# Patient Record
Sex: Male | Born: 1935 | Race: White | Hispanic: No | Marital: Married | Smoking: Former smoker
Health system: Southern US, Community
[De-identification: ages and names within clinical notes are randomized; demographics above are authoritative.]

## PROBLEM LIST (undated history)

## (undated) DIAGNOSIS — K5900 Constipation, unspecified: Secondary | ICD-10-CM

## (undated) DIAGNOSIS — Z8719 Personal history of other diseases of the digestive system: Secondary | ICD-10-CM

## (undated) DIAGNOSIS — R51 Headache: Secondary | ICD-10-CM

## (undated) DIAGNOSIS — I82409 Acute embolism and thrombosis of unspecified deep veins of unspecified lower extremity: Secondary | ICD-10-CM

## (undated) DIAGNOSIS — R519 Headache, unspecified: Secondary | ICD-10-CM

## (undated) DIAGNOSIS — T8859XA Other complications of anesthesia, initial encounter: Secondary | ICD-10-CM

## (undated) DIAGNOSIS — J309 Allergic rhinitis, unspecified: Secondary | ICD-10-CM

## (undated) DIAGNOSIS — D649 Anemia, unspecified: Secondary | ICD-10-CM

## (undated) DIAGNOSIS — Z87442 Personal history of urinary calculi: Secondary | ICD-10-CM

## (undated) DIAGNOSIS — N189 Chronic kidney disease, unspecified: Secondary | ICD-10-CM

## (undated) DIAGNOSIS — J45909 Unspecified asthma, uncomplicated: Secondary | ICD-10-CM

## (undated) DIAGNOSIS — J449 Chronic obstructive pulmonary disease, unspecified: Secondary | ICD-10-CM

## (undated) DIAGNOSIS — M549 Dorsalgia, unspecified: Secondary | ICD-10-CM

## (undated) DIAGNOSIS — C801 Malignant (primary) neoplasm, unspecified: Secondary | ICD-10-CM

## (undated) DIAGNOSIS — M1990 Unspecified osteoarthritis, unspecified site: Secondary | ICD-10-CM

## (undated) DIAGNOSIS — I10 Essential (primary) hypertension: Secondary | ICD-10-CM

## (undated) DIAGNOSIS — T4145XA Adverse effect of unspecified anesthetic, initial encounter: Secondary | ICD-10-CM

## (undated) DIAGNOSIS — Z9889 Other specified postprocedural states: Secondary | ICD-10-CM

## (undated) DIAGNOSIS — Z86010 Personal history of colonic polyps: Secondary | ICD-10-CM

## (undated) DIAGNOSIS — N400 Enlarged prostate without lower urinary tract symptoms: Secondary | ICD-10-CM

## (undated) DIAGNOSIS — J439 Emphysema, unspecified: Secondary | ICD-10-CM

## (undated) HISTORY — PX: OTHER SURGICAL HISTORY: SHX169

## (undated) HISTORY — PX: COLONOSCOPY: SHX174

## (undated) HISTORY — PX: BACK SURGERY: SHX140

## (undated) HISTORY — PX: CHOLECYSTECTOMY: SHX55

## (undated) HISTORY — PX: LITHOTRIPSY: SUR834

## (undated) HISTORY — DX: Emphysema, unspecified: J43.9

---

## 1999-12-12 ENCOUNTER — Ambulatory Visit (HOSPITAL_COMMUNITY): Admission: RE | Admit: 1999-12-12 | Discharge: 1999-12-12 | Payer: Self-pay | Admitting: Gastroenterology

## 2000-01-04 ENCOUNTER — Emergency Department (HOSPITAL_COMMUNITY): Admission: EM | Admit: 2000-01-04 | Discharge: 2000-01-05 | Payer: Self-pay | Admitting: Emergency Medicine

## 2000-01-05 ENCOUNTER — Encounter: Payer: Self-pay | Admitting: Emergency Medicine

## 2000-01-13 ENCOUNTER — Inpatient Hospital Stay (HOSPITAL_COMMUNITY): Admission: EM | Admit: 2000-01-13 | Discharge: 2000-01-14 | Payer: Self-pay | Admitting: *Deleted

## 2000-01-13 ENCOUNTER — Encounter: Payer: Self-pay | Admitting: *Deleted

## 2000-01-13 ENCOUNTER — Encounter: Payer: Self-pay | Admitting: Urology

## 2000-01-14 ENCOUNTER — Encounter: Payer: Self-pay | Admitting: Urology

## 2000-01-16 ENCOUNTER — Ambulatory Visit (HOSPITAL_COMMUNITY): Admission: RE | Admit: 2000-01-16 | Discharge: 2000-01-16 | Payer: Self-pay | Admitting: Urology

## 2000-01-16 ENCOUNTER — Encounter: Payer: Self-pay | Admitting: Urology

## 2000-01-17 ENCOUNTER — Ambulatory Visit (HOSPITAL_COMMUNITY): Admission: RE | Admit: 2000-01-17 | Discharge: 2000-01-17 | Payer: Self-pay | Admitting: Urology

## 2000-04-14 ENCOUNTER — Ambulatory Visit (HOSPITAL_COMMUNITY): Admission: RE | Admit: 2000-04-14 | Discharge: 2000-04-14 | Payer: Self-pay | Admitting: *Deleted

## 2000-06-22 ENCOUNTER — Ambulatory Visit (HOSPITAL_COMMUNITY): Admission: RE | Admit: 2000-06-22 | Discharge: 2000-06-22 | Payer: Self-pay | Admitting: Internal Medicine

## 2004-08-10 ENCOUNTER — Inpatient Hospital Stay (HOSPITAL_COMMUNITY): Admission: EM | Admit: 2004-08-10 | Discharge: 2004-08-14 | Payer: Self-pay | Admitting: Emergency Medicine

## 2004-08-11 ENCOUNTER — Encounter (INDEPENDENT_AMBULATORY_CARE_PROVIDER_SITE_OTHER): Payer: Self-pay | Admitting: Specialist

## 2004-08-27 ENCOUNTER — Ambulatory Visit (HOSPITAL_COMMUNITY): Admission: RE | Admit: 2004-08-27 | Discharge: 2004-08-27 | Payer: Self-pay | Admitting: Internal Medicine

## 2005-03-26 ENCOUNTER — Ambulatory Visit (HOSPITAL_COMMUNITY): Admission: RE | Admit: 2005-03-26 | Discharge: 2005-03-26 | Payer: Self-pay | Admitting: Gastroenterology

## 2005-03-26 ENCOUNTER — Encounter (INDEPENDENT_AMBULATORY_CARE_PROVIDER_SITE_OTHER): Payer: Self-pay | Admitting: *Deleted

## 2005-11-11 ENCOUNTER — Encounter: Admission: RE | Admit: 2005-11-11 | Discharge: 2005-11-11 | Payer: Self-pay | Admitting: Internal Medicine

## 2008-06-02 ENCOUNTER — Encounter (INDEPENDENT_AMBULATORY_CARE_PROVIDER_SITE_OTHER): Payer: Self-pay | Admitting: Internal Medicine

## 2008-06-02 ENCOUNTER — Ambulatory Visit (HOSPITAL_COMMUNITY): Admission: RE | Admit: 2008-06-02 | Discharge: 2008-06-02 | Payer: Self-pay | Admitting: Internal Medicine

## 2008-06-02 ENCOUNTER — Ambulatory Visit: Payer: Self-pay | Admitting: Vascular Surgery

## 2010-04-11 ENCOUNTER — Ambulatory Visit (HOSPITAL_COMMUNITY): Admission: RE | Admit: 2010-04-11 | Discharge: 2010-04-11 | Payer: Self-pay | Admitting: Gastroenterology

## 2010-12-27 NOTE — H&P (Signed)
[place]  Patient:    Justin Woods, Justin Woods                     MRN: [id number] Adm. Date:  [id number] Attending:  Monica Becton                         History and Physical  HISTORY:  This 77 year old white male has a past history of urinary tract calculi, but they all passed spontaneously.  A week ago, he had the onset of left flank pain, and he was found to have a left distal ureteral stone.  This caused intermittent pain, but this morning he came to the emergency room with severe right flank pain, nausea and vomiting.  A CT scan and KUB showed a 3 or 4 mm stone in the distal left ureter and a 4 to 5 mm stone in the upper right ureter about at the top of L4.  Because of severe pain symptomatology, he was admitted for further evaluation and treatment and will be scheduled for surgery today.  PAST MEDICAL HISTORY:  He has a past history of hypertension and asthma.  On EKG, he had some sinus bradycardia.  MEDICATIONS:  Include atenolol at I think a 25 mg dosage.  He also uses Flovent p.r.n. and has been on Percocet for stone pain relief.  ALLERGIES:  None.  SOCIAL HISTORY:  Tobacco: None. Alcohol: None.  PAST SURGICAL HISTORY:  Includes back surgery for lumbar disks, right arthroscopic knee surgery.  REVIEW OF SYSTEMS:  Documented in health history form on the chart.  The above problems are noted.  No other acute significant complaints.  FAMILY HISTORY:  Noncontributory.  PHYSICAL EXAMINATION:  GENERAL:  A very ill-appearing white male who appears stated age.  VITAL SIGNS:  Blood pressure 170/79, pulse 60 and regular, respiratory rate 12, and he was afebrile.  LUNGS:  No respiratory stones.  CHEST:  Clear.  CARDIAC:  Heart tones were regular.  ABDOMEN:  Soft and nontender with slight guarding on the right side but no hepatosplenomegaly, no hernias.  GU:  Penis, urethral meatus, scrotum, testicles, and epididymes  without lesion.  RECTAL:  Exam deferred.  EXTREMITIES:  No edema.  IMPRESSION: 1. Bilateral ureteral calculi. 2. Hypertension. 3. Asthma. 4. Sinus bradycardia.  PLAN:  Admit for IV fluids, IV narcotics, and cystoscopic procedure later in the day. DD:  01/13/00 TD:  01/13/00 Job: [id number]

## 2010-12-27 NOTE — Op Note (Signed)
NAME:  Justin Woods, Justin Woods              ACCOUNT NO.:  [id number]   MEDICAL RECORD NO.:  [id number]          PATIENT TYPE:  INP   LOCATION:  5733                         FACILITY:  [facility]   PHYSICIAN:  Gabrielle Dare. Janee Morn, M.D.DATE OF BIRTH:  02/21/1931   DATE OF PROCEDURE:  08/11/2004  DATE OF DISCHARGE:                                 OPERATIVE REPORT   PREOPERATIVE DIAGNOSES:  1.  Acute cholecystitis.  2.  Possible choledocholithiasis.   POSTOPERATIVE DIAGNOSES:  1.  Acute cholecystitis.  2.  Choledocholithiasis.   PROCEDURE:  Laparoscopic cholecystectomy with intraoperative cholangiogram.   SURGEON:  Gabrielle Dare. Janee Morn, M.D.   ASSISTANT:  Sandria Bales. Ezzard Standing, M.D.   ANESTHESIA:  General.   HISTORY OF PRESENT ILLNESS:  The patient is a 77 year old white male who was  admitted by Dr. Vilinda Boehringer last night with acute cholecystitis and possible  choledocholithiasis.  Liver function tests were elevated and he had some  right upper quadrant pain which was relieved with pain medication.  This  morning, the patient's liver function tests did remain somewhat elevated,  bilirubin was down a little bit to 1.9 and his pain was significantly better  and he is brought for cholecystectomy with cholangiogram.   PROCEDURE IN DETAIL:  Informed consent was obtained, the patient was  receiving intravenous antibiotics, he was brought to the operating room and  general anesthesia was administered.  His abdomen was prepped and draped in  a sterile fashion.  A supraumbilical incision was made along the midline and  the subcutaneous tissues were dissected down within the anterior fascia  which was divided sharply.  The peritoneal cavity was entered under direct  vision without difficulty.  A 0 Vicryl purse-string suture was placed around  the fascial opening and the Hasson trocar was inserted into the abdomen and  the abdomen was insufflated with carbon dioxide in the standard fashion.  Under direct  vision, an 11-mm epigastric and two 5-mm lateral ports were  placed.  0.25% Marcaine with epinephrine was used at all port sites for a  local anesthetic.  The dome of the gallbladder was retracted superomedially.  Several omental adhesions were taken down without difficulty, revealing the  infundibulum which was retracted inferolaterally.  Dissection was begun  laterally and progressed medially, easily identifying the cystic duct and  the cystic artery.  We continued the dissection until a large window was  made between the infundibulum and the cystic duct and the liver.  During  this dissection the cystic artery was also nicely identified and dissected  with a large window.  At this time, two clips were placed proximally on the  cystic artery and one was placed distally and then a clip was placed on the  infundibulo cystic duct junction.  A small nick was made in the cystic duct  and a cholangiogram catheter was inserted.  Intraoperative cholangiogram was  then obtained which demonstrated a likely small stone in the distal common  bile duct.  A little bit of contrast did still get through to the duodenum  but there was a persistent filling defect in  the distal common bile duct.  No other abnormalities were noted.  The cholangiogram catheter was removed,  three clips were placed proximally on the cystic duct and it was divided.  The cystic artery was then also divided between the clips placed previously  and the gallbladder was taken off the liver bed with Bovie cautery.  Several  areas in the liver bed were cauterized to get excellent hemostasis while  removing the gallbladder and then the gallbladder was placed in an EndoCatch  bag and taken out of the abdomen via the supraumbilical port site.  The  abdomen was copiously irrigated.  The liver bed was bovied to get excellent  hemostasis.  Once this was accomplished, some further irrigation was done  and the irrigation returned clear.  We  used a total of almost of 2 liters of  irrigation.  Once irrigation was removed, the ports were removed under  direct vision, the pneumoperitoneum was released, the Hasson trocar was  removed from the abdomen, and the supraumbilical fascial defect was closed  by tying the 0 Vicryl purse-string suture.  All four wounds were copiously  irrigated and some additional local anesthetic was injected and the skin of  each was closed with a running 4-0 Vicryl subcuticular stitch.  Sponge,  needle and instrument counts were correct.  Benzoin and Steri-Strips and  sterile dressings were applied.  The patient tolerated the procedure well  without apparent complications and was taken to the recovery room in stable  condition.   I spoke with Dr. Vilinda Boehringer who admitted the patient in regards to the  findings on our cholangiogram and he plans to arrange for likely ERCP on  Monday but we will order the patient's liver function tests to be rechecked  in the morning to see if they are still elevated.       BET/MEDQ  D:  08/11/2004  T:  08/11/2004  Job:  [id number]   cc:   Fayrene Fearing L. Malon Kindle., M.D.  [address]  [place]  [place] [address]  Fax: [phone]

## 2010-12-27 NOTE — Op Note (Signed)
NAME:  Justin Woods, Justin Woods NO.:  [id number]   MEDICAL RECORD NO.:  [id number]          PATIENT TYPE:  AMB   LOCATION:  ENDO                         FACILITY:  [facility]   PHYSICIAN:  Danise Edge, M.D.   DATE OF BIRTH:  [birth year]   DATE OF PROCEDURE:  03/26/2005  DATE OF DISCHARGE:                                 OPERATIVE REPORT   PROCEDURE:  Colonoscopy and polypectomy.   PROCEDURE INDICATION:  [name] is a 77 year old male born Dec 25, 1930.  [name] has undergone colonoscopic exams to remove neoplastic but  noncancerous colon polyps in the past.   ENDOSCOPIST:  Danise Edge, M.D.   PREMEDICATION:  Versed 4 mg, Demerol 50 mg.   PROCEDURE:  After obtaining informed consent, [name] was placed in the  left lateral decubitus position.  I administered intravenous Demerol and  intravenous Versed to achieve conscious sedation for the procedure.  The  patient's blood pressure, oxygen saturation and cardiac rhythm were  monitored throughout the procedure and documented in the medical record.   Anal inspection and digital rectal exam were normal.  The prostate was  nonnodular.  The Olympus adjustable pediatric colonoscope was introduced  into the rectum and advanced to the cecum.  A normal-appearing ileocecal  valve and appendiceal orifice were identified.  Colonic preparation for the  exam today was satisfactory.   Rectum:  A 2-mm sessile polyp was removed from the midrectum with the cold  biopsy forceps.  Retroflexed view of the distal rectum normal.  Sigmoid colon and descending colon:  Left colonic diverticulosis.  At 35 cm  from the anal verge, a 2-mm sessile polyp was removed with cold biopsy  forceps.  Splenic flexure normal.  Transverse colon normal.  Hepatic flexure normal.  Ascending colon normal.  Cecum and ileocecal valve normal.   ASSESSMENT:  1.  From the midrectum, a small polyp was removed with the cold biopsy      forceps and  from the distal sigmoid colon, a small polyp was removed      with the cold biopsy forceps.  Both polyps were submitted in one bottle      for      pathologic evaluation.  2.  Left colonic diverticulosis.   RECOMMENDATIONS:  Repeat colonoscopy in five years.           ______________________________  Danise Edge, M.D.     MJ/MEDQ  D:  03/26/2005  T:  03/26/2005  Job:  [id number]   cc:   Georgann Housekeeper, MD  [address]  [place]  [place] [address]  Fax: [phone]

## 2010-12-27 NOTE — H&P (Signed)
NAME:  Justin Woods, Justin Woods              ACCOUNT NO.:  [id number]   MEDICAL RECORD NO.:  [id number]          PATIENT TYPE:  EMS   LOCATION:  MAJO                         FACILITY:  [facility]   PHYSICIAN:  James L. Malon Kindle., M.D.DATE OF BIRTH:  25-Sep-1930   DATE OF ADMISSION:  08/10/2004  DATE OF DISCHARGE:                                HISTORY & PHYSICAL   REASON FOR ADMISSION:  Gallstones and abdominal pain.   HISTORY:  A 77 year old gentleman who was feeling fairly well. Today, he had  fairly sudden onset of back pain. He was going away to visit relatives. The  pain started in his back and came around to the front. He arrived at their  home this afternoon and became nauseated and threw up. He came back down to  [facility] and came to the emergency room. He was not known to have  gallstones. A CT scan showed a gallbladder with sludge and some thickening.  No clear dilated ducts, no pancreatitis. A gallbladder ultrasound was  obtained and showed sludge and thickening of the bladder but no clear  dilated ducts. Official report is still pending at this time. Other  pertinent data revealed an AST of 301, ALT of 127, alkaline phosphatase 83,  and a total bilirubin of 2.1. The patient is clinically feeling somewhat  better after some pain medicine.   ADMISSION MEDICATIONS:  Atenolol 25 mg once daily, some other blood pressure  pill the name of which he does not currently know.   ALLERGIES:  HE HAS NO DRUG ALLERGIES.   PAST MEDICAL HISTORY:  He has a history of kidney stones. Has had passage of  several stones and had stents in place. He has never had any abdominal  surgery. The only surgery has been surgery on his back and knee. He has  hypertension. No heart disease.   FAMILY HISTORY:  Parents both had heart disease. Mother had gallstones. Both  are dead of heart-related issues. He had a brother with lung cancer, another  brother with some type of cancer, he is not sure of the  primary.   REVIEW OF SYSTEMS:  GI review of systems is remarkable for lack of ulcers,  dyspepsia, heartburn. He has normal bowel movements without any recent  change until today. He has colon polyps and has regular colonoscopies by Dr.  Danise Edge.   PHYSICAL EXAMINATION:  VITAL SIGNS:  Temperature 96.7, pulse 54, blood  pressure 145/81.  GENERAL:  Alert, white male in no acute distress.  HEENT:  Mucous membranes are dry. Eyes clear, nonicteric, extraocular  movements intact.  NECK:  Supple, no lymphadenopathy.  HEART:  Regular rate and rhythm without murmurs, rubs, or gallops.  LUNGS:  Clear.  ABDOMEN:  Nondistended and soft with good bowel sounds, with mild tenderness  in the right upper quadrant. The patient has received some pain medicines.   ASSESSMENT:  Acute abdominal pain probably due to acute calculus  cholecystitis, question possible common bile duct stone with increased labs  but no dilated ducts on any of the imaging studies.   PLAN:  We will admit. We  will get IV antibiotics, surgical consultation with  Dr. Violeta Gelinas.       JLE/MEDQ  D:  08/10/2004  T:  08/10/2004  Job:  [id number]   cc:   Gabrielle Dare. Janee Morn, M.D.  [facility]  [address] [place], [place] [address]  Fax: [phone]   Danise Edge, M.D.  [address]  [place]  [place] [address]  Fax: [phone]   Georgann Housekeeper, MD  [address]  [place]  [place] [address]  Fax: [phone]

## 2010-12-27 NOTE — Procedures (Signed)
[facility]  Patient:    Justin Woods, Justin Woods                       MRN: [id number] Proc. Date: 12/12/99 Attending:  Verlin Grills, M.D.                           Procedure Report  PROCEDURE:                    Colonoscopy with polypectomy.  REFERRING PHYSICIAN:   Dr. Tawanna Cooler _____.  PROCEDURE INDICATION:  Justin Woods is a 77 year old male undergoing colonoscopy to remove colon polyps.  Justin Woods presented to Dr. _____ with foul-smelling brownish anorectal liquid discharge associated with anorectal pain but no fever.  His bowel movements are  normal.  He occasionally sees blood on the toilet tissue after bowel movements.  In February 2001, Dr. _____ performed an anal exam, which was normal.  His symptoms persist despite ProctoFoam.  In 1998, Justin Woods underwent a colonoscopy, which revealed internal hemorrhoids but no other abnormalities.  I discussed with Justin Woods the complications associated with a colonoscopy and  polypectomy, including intestinal bleeding and intestinal perforation.  Justin Woods has signed the operative permit.  ENDOSCOPIST:  Verlin Grills, M.D.  PREMEDICATION:  Demerol 50 mg, Versed 5 mg.  ENDOSCOPE:  Olympus pediatric colonoscope.  DESCRIPTION OF PROCEDURE:  After obtaining informed consent, the patient was placed in the left lateral decubitus position.  I administered intravenous Demerol and  intravenous Versed to achieve conscious sedation for the procedure.  The patients blood pressure, oxygen saturation, and cardiac rhythm were monitored throughout the procedure and documented in the medical record.  Anal inspection was completely normal.  With the patient sedated, I was able to get a good exam of the anus, and I do not detect by visual exam or by palpation any  abnormalities.  There is no sign of inflammation, fissures, fistulas, or abscesses. Digital rectal examination is normal.  The  prostate is not nodular.  The Olympus pediatric video colonoscope was introduced to the rectum under direct vision and advanced to the cecum, identified by a normal-appearing ileocecal valve. Colonic preparation for the exam today was excellent.  Rectum normal.  Retroflex view of the proximal rectum does not reveal any significant internal hemorrhoids, and I do not detect any evidence of anal carcinoma by close exam.  Sigmoid colon normal.  Descending colon normal.  Splenic flexure normal.  Transverse colon normal.  Hepatic flexure normal.  Ascending colon:  In the proximal ascending colon, a 1 mm sessile polyp was removed with the cold snare and submitted for pathologic interpretation.  Cecum and ileocecal valve normal.  ASSESSMENT:  From the proximal ascending colon, a 1 mm sessile polyp was removed with the cold snare and submitted for pathologic interpretation.  Otherwise normal exam.  RECOMMENDATIONS:  Repeat colonoscopy in five years if polyp returns adenomatous. DD:  12/12/99 TD:  12/16/99 Job: [id number]

## 2010-12-27 NOTE — Op Note (Signed)
[place]. [facility]  Patient:    Justin Woods, Justin Woods                       MRN: [id number] Proc. Date: 01/13/00 Attending:  Maretta Bees. Vonita Moss, M.D.                           Operative Report  PREOPERATIVE DIAGNOSIS:  Bilateral ureteral calculi.  POSTOPERATIVE DIAGNOSIS:  Bilateral ureteral calculi plus urethral stricture.  OPERATION PERFORMED:  Cystoscopy, urethral dilation with filiforms and followers, left ureteroscopic stone manipulation and extraction, bilateral retrograde pyelogram with interpretation, bilateral double-J catheter placement.  SURGEON:  Maretta Bees. Vonita Moss, M.D.  ANESTHESIA:  General.  INDICATIONS FOR PROCEDURE:  This 77 year old gentleman with a long history of stone disease was admitted because of bilateral ureteral stones and symptomatic problems for several days.  The left ureteral stone was distal and the right ureteral stone was about the top of L4 in the upper ureter.  DESCRIPTION OF PROCEDURE:  The patient was brought to the operating room and placed in lithotomy position.  External genitalia were prepped and draped in the usual sterile fashion.  He was cystoscoped and he had a very tight urethral stricture in the bulbous urethra and the cystoscope would not pass, so I inserted a guide wire through this very pinpoint opening and over the guide wire inserted Hayman dilating sounds and dilated him to 28 [place]. This seemed to be a fairly soft stricture fortunately.  At this point I was able to negotiate the stricture with the cystoscope and he had partial prostatic obstruction.  The bladder had some trabeculation but no stones or tumors. Under fluoroscopic control I inserted a metal guide wire up the left ureter, past the stone in the distal left ureter.  Over the guide wire I performed a balloon dilation of the intramural ureter and to eliminate a waist in the midsection of the balloon, I had to use 14 atmospheres of pressure and  then back down to 12 atmospheres of pressure for three more minutes.  At this point with the guide wire in place, I inserted a 5 French rigid short ureteroscope without any difficulty using a guide wire as a landmark and found a small, dark, smooth stone and used the Segura stone basket to retrieve the stone intact which I later gave to the family.  I then injected contrast through the ureteroscope and demonstrated the pyelocaliceal system which at this point was slightly full, but drained to a normal configuration in just a minute or so. With the guide wire in place and contrast visualized, I inserted a 6 French 26 cm double-J catheter which coiled in the pyelocaliceal system of the left kidney and had a full coil in the bladder and the string brought out per urethra.  I then recystoscoped him and inserted a guide wire metal up the right ureter until it got to the stone and at this point the guide wire went past the stone but the stone moved higher up in the ureter and I think eventually moved into the kidney.  An open ended ureteral catheter was then put over the guide wire and a retrograde pyelogram was obtained which showed mild pyelocaliectasis.  With the guide wire still in place backloaded through the cystoscope, I inserted a 6 French 26 cm double-J catheter up in this renal collecting system in good position as was the left side.  String  was also brought out per urethra.  Both ureteral catheter strings were taped down to the dorsum of the penis and a 16 French Foley catheter inserted to the bladder, irrigated and connected to closed drainage.  At this point he was taken to the recovery room in good condition. DD:  01/13/00 TD:  01/16/00 Job: [id number]

## 2010-12-27 NOTE — Consult Note (Signed)
NAME:  Justin Woods, Justin Woods              ACCOUNT NO.:  [id number]   MEDICAL RECORD NO.:  [id number]          PATIENT TYPE:  INP   LOCATION:  1829                         FACILITY:  [facility]   PHYSICIAN:  Gabrielle Dare. Janee Morn, M.D.DATE OF BIRTH:  1930/11/29   DATE OF CONSULTATION:  08/10/2004  DATE OF DISCHARGE:                                   CONSULTATION   REASON FOR CONSULTATION:  Cholecystitis.   HISTORY OF PRESENT ILLNESS:  The patient is a pleasant 77 year old white  male who had the sudden onset of right back pain early this morning.  This  radiated around to the front in his right upper quadrant.  He came to [facility] for evaluation.  He does have a history of kidney  stones in the past.  Workup here at the ER included CT scan of the abdomen  and pelvis which showed some gallstones.  Subsequent ultrasound of the  gallbladder was done which showed gallstones and some tenderness present  over the gallbladder as well as wall thickening consistent with acute  cholecystitis.  LFTs demonstrate AST 301, ALT 127, alk phos 83, BUN 2.1.  White blood cell count is 9.3.  The patient's pain has gone after receiving  some pain meds intravenously in the emergency department and he has no other  complaints and no current nausea.   PAST MEDICAL HISTORY:  COPD, kidneys stones, deep venous thrombosis, and  hypertension.   PAST SURGICAL HISTORY:  Left knee surgery, renal stents.   MEDICATIONS AT HOME:  Tenormin.  In the hospital he is going to be on  Dilaudid, Phenergan, and Cipro.   ALLERGIES:  No known drug allergies.   REVIEW OF SYSTEMS:  GENERAL:  He feels better than when he came in.  CARDIAC:  Negative.  PULMONARY:  Negative.  GI:  See the history of present  illness.  GU:  History of renal stones, but no acute problems.  MUSCULOSKELETAL:  Negative.   PHYSICAL EXAMINATION:  Temperature 96.8, blood pressure 182/80, heart rate  54, respirations 18.  He is awake, alert,  in no acute distress.  EYES:  Pupils equal and reactive.  Sclerae nonicteric.  NECK:  Supple with no masses.  He has no supraclavicular, cervical, or  umbilical adenopathy.  LUNGS:  Clear to auscultation bilaterally.  HEART:  Regular rate and rhythm.  PMI is palpable in the left chest.  ABDOMEN:  Soft and distended.  Bowel sounds are present.  He has mild  tenderness in the right upper quadrant to deep palpation with no guarding.  No organomegaly is palpated.  SKIN:  Warm and dry with no rashes.  MUSCULOSKELETAL:  He has equal strength in upper and lower extremities.   DATA REVIEWED:  Radiology reports and laboratory reports as described above.   IMPRESSION:  Acute cholecystitis and possible choledocholithiasis.   RECOMMENDATIONS:  I agree with admission to the hospital with IV  antibiotics.  I also agree with Dr. Randa Evens and I plan to repeat LFTs in the  morning.  We will plan to do laparoscopic cholecystectomy tomorrow morning  with an  intraoperative cholangiogram if the patient's bilirubin is decreased  or the same.  If it goes significantly higher he may have an ERCP prior to  proceeding with his cholecystectomy.  The planned procedure, risks and  benefits were discussed in detail with the patient and his wife.  He is  agreeable, questions were answered.  Possible complications that were  discussed included, but were not limited to bleeding, infection, conversion  to open procedure, common bile duct injury, and bile leak.  This plan was  also discussed in detail with Dr. Randa Evens who was at the patient's bedside.  We will post him for planned cholecystectomy in the morning as described and  proceed depending on his labs.       BET/MEDQ  D:  08/10/2004  T:  08/10/2004  Job:  [id number]

## 2010-12-27 NOTE — Discharge Summary (Signed)
[name], Justin Woods              ACCOUNT NO.:  [id number]   MEDICAL RECORD NO.:  [id number]          PATIENT TYPE:  INP   LOCATION:  5707                         FACILITY:  [facility]   PHYSICIAN:  Bernette Redbird, M.D.   DATE OF BIRTH:  1931-04-19   DATE OF ADMISSION:  08/10/2004  DATE OF DISCHARGE:  08/14/2004                                 DISCHARGE SUMMARY   FINAL DIAGNOSES:  1.  Acute cholecystitis.  2.  Elevated liver chemistries, probable choledocholithiasis.  3.  Herpes labialis.  4.  History of kidney stones with renal stenting.  5.  Hypertension.   CONSULTATIONS:  Dr. Violeta Gelinas, general surgery.   PROCEDURE:  Laparoscopic cholecystectomy with intraoperative cholangiogram.  Endoscopic retrograde cholangiopancreaticography with sphincterotomy.   COMPLICATIONS:  None.   LABORATORY DATA:  Admission white count was 9300 and it became maximal at  14,600 following surgery and was 8300 at time of discharge.  Discharge  hemoglobin 13.1, platelets 224,000.  Admission liver chemistries pertinent  for bilirubin 2.1 which rose to 4.8 and was 1.7 at time of discharge, alk  phos numbers respectively were 83, 100, and 138.  AST was 301 on admission  and 47 on discharge and ALT was 127 on admission and 83 on discharge.  Albumin 2.7.  Creatinine 1.0, BUN 8.   HOSPITAL COURSE:  Justin Woods was admitted through the emergency room with  the abrupt onset of back pain which then became anterior in location.  Initially, with his history of kidney stones, the patient and his wife  thought this might be a recurrent attack but the anterior pain brought to  mind an alternative diagnosis and he presented to the emergency room where a  CT scan showed gallbladder sludge and some thickening without pancreatitis  and liver chemistries were elevated.  Surgical consultation was obtained and  he underwent a laparoscopic cholecystectomy the next day, with  intraoperative cholangiography raising the  question of a possible small  common duct stone, for which reason ERCP was performed the next day by  myself.  No clear stone was identified during that exam and the duct was not  dilated, but empiric sphincterotomy with loop pull-through was performed.  A  little bit of sludge was delivered, but no frank stone was seen.   The patient had a benign post ERCP course and his liver chemistries and  white count improved, his diet was advanced without difficulty and by the  second day after the ERCP, he was felt ready for discharge.   DISPOSITION:  The patient is discharged home on a low fat diet with  instructions to avoid heavy lifting and wound care per instructions from Dr.  Carollee Massed office.  He is to call in the event of fever, severe abdominal  pain or dark stools.   DISCHARGE MEDICATIONS:  1.  The patient will resume his home medications except for aspirin which he      will avoid for the next week.  2.  He will also have a prescription for Percocet to take 1-2 q.6h. p.r.n.      pain.  A prescription was offered for Zovirax because of recurrent herpes labialis  which occurred while in the hospital but due to the expense the patient's  wife declined to have a prescription for that medication.   The patient will follow up with Dr. Janee Morn in the office in about 3 weeks  (he is to call for that appointment).   CONDITION ON DISCHARGE:  Improved.       RB/MEDQ  D:  08/14/2004  T:  08/14/2004  Job:  [id number]

## 2010-12-27 NOTE — Procedures (Signed)
[facility]  Patient:    Justin Woods, Justin Woods                     MRN: [id number] Proc. Date: 04/14/00 Adm. Date:  [id number] Disc. Date: [id number] Attending:  Mingo Amber CC:         Verlin Grills, M.D.   Procedure Report  PROCEDURE:  Anorectal manometry.  INDICATIONS:  A 77 year old male referred by Dr. Charolett Bumpers for problems with fecal incontinence and leakage.  The procedure was reviewed with the patient in terms of technique.  There are no known potential complications of anorectal manometry.  DESCRIPTION OF PROCEDURE:  The balloon catheter probe was inserted via the rectum and advanced to 10 cm.  Equilibration was allowed to take place for 5 minutes and then the procedure proceeded.  On station withdrawal, the maximal average resting tone of the sphincter, which is reflective of internal sphincter activity was 46 mmHg, which is within normal limits and occurred 4 cm from the anal verge.  The maximal average squeeze pressure occurred at 3 cm from the anal verge and was 237 mmHg, which is normal and actually very good.  This is reflective of external voluntary sphincter pressures.  The anterior resting tone was somewhat asymmetrically decreased, but this was compensated for on squeezing.  The rectoanal inhibitory reflex was present with a threshold of sensation of 30 cc, which is decreased.  The patient had an urge to defecate at 150 cc in the rectal balloon, which is mildly decreased, but he did tolerate 200 cc, which is normal.  IMPRESSION:  Borderline normal internal sphincter pressure, normal external sphincter pressures, normal rectoanal inhibitory reflex, but mildly decreased sensation.  This may be secondary to his back surgery or to a neuropathy.  The patient might be helped by using stool bulking or drying agents and paying careful attention to rectal sensation.  However, there is no myopathy present here that  could be medically or surgically treated. DD:  04/17/00 TD:  04/18/00 Job: [id number]

## 2010-12-27 NOTE — Op Note (Signed)
NAME:  Justin Woods, Justin Woods NO.:  [id number]   MEDICAL RECORD NO.:  [id number]          PATIENT TYPE:  INP   LOCATION:  5707                         FACILITY:  [facility]   PHYSICIAN:  Bernette Redbird, M.D.   DATE OF BIRTH:  1931-07-25   DATE OF PROCEDURE:  08/12/2004  DATE OF DISCHARGE:                                 OPERATIVE REPORT   PROCEDURE:  ERCP with sphincterotomy and balloon pull through.   SURGEON:   INDICATIONS FOR PROCEDURE:  A 77 year old gentleman, status post  laparoscopic cholecystectomy yesterday at which time there was a meniscus in  the distal duct with poor drainage and elevation of liver chemistries,  including further elevation this morning, prompting the need for this  procedure.   FINDINGS:  No definite stone present; empiric sphincterotomy with balloon  pull through performed.  Small amount of sludge delivered.   DESCRIPTION OF PROCEDURE:  The nature, purpose and risks of the procedure  had been discussed with the patient by my covering partner, Dr. Randa Evens, and  I reviewed the major risks with the patient including death, pancreatitis  and bleeding, and possible need for emergency surgery, with the patient  immediately prior to the procedure.  Written consent was already provided.  Sedation was Fentanyl 35 mcg and Versed 3.5 mg IV prior to and during the  course of the procedure without arrhythmias or desaturation despite his  history of mild emphysema.  Glucagon 0.5 mg was administered as well to help  reduce duodenal contractility.  The Olympus video duodenoscope was advanced  into the duodenum through a grossly normal appearing stomach, and the major  papilla was identified underneath the fold.  The minor papilla looked  normal.  There was a moderate amount of bile within the duodenal lumen,  implying that there was not complete biliary obstruction at the start of the  procedure.   Cannulation was accomplished using the triple lumen  sphincterotome and the  guidewire which was readily advanced into the common bile duct deeply, after  which an injection of contrast was performed, confirming intra-biliary  location of the guidewire.  During this time, I did not see any definite  filling defect or stone, although there was a little bit of irregularity of  contrast where a small stone may have been present within the duct.  Again,  this was questionable, but based on the patient's labs and clinical history,  sphincterotomy was felt to be indicated and was accomplished using the ERBE  coagulation device with minimal hemorrhage.  There was a good flow of bile.  The sphincterotome was exchanged for the 8.5 mm balloon-tipped catheter  which was pulled through twice, coming through the papillotomy without any  significant resistance.  An occlusion cholangiogram prior to the second pull  through did not disclose any definite stones, although at one time, filling  defect probably present an air bubble was transiently seen.   It was noted that there was a small amount of debris or gravel that seemed  to issue from the papilla at the time of the sphincterotomy but no stone was  observed to be delivered.  Subsequent film showed good drain out of  contrast.  No filling defects could be identified at the conclusion of the  procedure.  The patient tolerated it well and there were no apparent  complications.   IMPRESSION:  1.  Essentially normal cholangiogram.  Questionable stone present.  No      obvious stricture, tumor or significant dilatation.  2.  Empiric sphincterotomy, probably measuring about 10 to 12 mm, performed.  3.  Two balloon pull throughs with a small amount of sludge or gravel      recovered.  4.  Pancreatic duct not entered or attempted on this examination, which      incidentally was performed with the patient in the left lateral      decubitus position.   PLAN:  Clinical follow-up.       RB/MEDQ  D:   08/12/2004  T:  08/12/2004  Job:  [id number]   cc:   Gabrielle Dare. Janee Morn, M.D.  [facility]  [address] [place], [place] [address]  Fax: [phone]   Georgann Housekeeper, MD  [address]  [place]  [place] [address]  Fax: [phone]

## 2011-12-23 ENCOUNTER — Other Ambulatory Visit (HOSPITAL_COMMUNITY): Payer: Self-pay | Admitting: Internal Medicine

## 2011-12-23 DIAGNOSIS — J449 Chronic obstructive pulmonary disease, unspecified: Secondary | ICD-10-CM

## 2011-12-31 ENCOUNTER — Ambulatory Visit (HOSPITAL_COMMUNITY)
Admission: RE | Admit: 2011-12-31 | Discharge: 2011-12-31 | Disposition: A | Discharge status: Home / Self Care | Payer: Medicare Other | Source: Ambulatory Visit | Attending: Internal Medicine | Admitting: Internal Medicine

## 2011-12-31 DIAGNOSIS — J4489 Other specified chronic obstructive pulmonary disease: Secondary | ICD-10-CM | POA: Insufficient documentation

## 2011-12-31 DIAGNOSIS — J449 Chronic obstructive pulmonary disease, unspecified: Secondary | ICD-10-CM | POA: Insufficient documentation

## 2011-12-31 MED ORDER — ALBUTEROL SULFATE (5 MG/ML) 0.5% IN NEBU
2.5000 mg | INHALATION_SOLUTION | Freq: Once | RESPIRATORY_TRACT | Status: AC
  Administered 2011-12-31: 2.5 mg via RESPIRATORY_TRACT

## 2012-07-22 ENCOUNTER — Other Ambulatory Visit: Payer: Self-pay | Admitting: Internal Medicine

## 2012-07-22 DIAGNOSIS — M545 Low back pain: Secondary | ICD-10-CM

## 2012-07-27 ENCOUNTER — Ambulatory Visit
Admission: RE | Admit: 2012-07-27 | Discharge: 2012-07-27 | Disposition: A | Discharge status: Home / Self Care | Payer: Medicare Other | Source: Ambulatory Visit | Attending: Internal Medicine | Admitting: Internal Medicine

## 2012-07-27 DIAGNOSIS — M545 Low back pain: Secondary | ICD-10-CM

## 2012-07-27 MED ORDER — GADOBENATE DIMEGLUMINE 529 MG/ML IV SOLN
19.0000 mL | Freq: Once | INTRAVENOUS | Status: AC | PRN
  Administered 2012-07-27: 19 mL via INTRAVENOUS

## 2014-01-18 ENCOUNTER — Other Ambulatory Visit: Payer: Self-pay | Admitting: Internal Medicine

## 2014-01-18 ENCOUNTER — Ambulatory Visit
Admission: RE | Admit: 2014-01-18 | Discharge: 2014-01-18 | Disposition: A | Discharge status: Home / Self Care | Payer: BC Managed Care – HMO | Source: Ambulatory Visit | Attending: Internal Medicine | Admitting: Internal Medicine

## 2014-01-18 DIAGNOSIS — M542 Cervicalgia: Secondary | ICD-10-CM

## 2014-01-25 ENCOUNTER — Other Ambulatory Visit: Payer: Self-pay | Admitting: Internal Medicine

## 2014-01-25 DIAGNOSIS — M542 Cervicalgia: Secondary | ICD-10-CM

## 2014-01-28 ENCOUNTER — Ambulatory Visit
Admission: RE | Admit: 2014-01-28 | Discharge: 2014-01-28 | Disposition: A | Discharge status: Home / Self Care | Payer: BC Managed Care – HMO | Source: Ambulatory Visit | Attending: Internal Medicine | Admitting: Internal Medicine

## 2014-01-28 DIAGNOSIS — M542 Cervicalgia: Secondary | ICD-10-CM

## 2014-05-29 ENCOUNTER — Ambulatory Visit (HOSPITAL_COMMUNITY)
Admission: RE | Admit: 2014-05-29 | Discharge: 2014-05-29 | Disposition: A | Discharge status: Home / Self Care | Payer: Medicare Other | Source: Ambulatory Visit | Attending: Internal Medicine | Admitting: Internal Medicine

## 2014-05-29 ENCOUNTER — Encounter (HOSPITAL_COMMUNITY): Payer: Self-pay

## 2014-05-29 ENCOUNTER — Other Ambulatory Visit (HOSPITAL_COMMUNITY): Payer: Self-pay | Admitting: Internal Medicine

## 2014-05-29 DIAGNOSIS — R109 Unspecified abdominal pain: Secondary | ICD-10-CM | POA: Insufficient documentation

## 2014-05-29 DIAGNOSIS — R509 Fever, unspecified: Secondary | ICD-10-CM

## 2014-05-29 MED ORDER — IOHEXOL 300 MG/ML  SOLN
80.0000 mL | Freq: Once | INTRAMUSCULAR | Status: AC | PRN
  Administered 2014-05-29: 80 mL via INTRAVENOUS

## 2014-11-08 ENCOUNTER — Ambulatory Visit
Admission: RE | Admit: 2014-11-08 | Discharge: 2014-11-08 | Disposition: A | Discharge status: Home / Self Care | Payer: Self-pay | Source: Ambulatory Visit | Attending: Internal Medicine | Admitting: Internal Medicine

## 2014-11-08 ENCOUNTER — Other Ambulatory Visit: Payer: Self-pay | Admitting: Internal Medicine

## 2014-11-08 DIAGNOSIS — R509 Fever, unspecified: Secondary | ICD-10-CM

## 2015-01-30 ENCOUNTER — Emergency Department (HOSPITAL_COMMUNITY): Payer: PPO

## 2015-01-30 ENCOUNTER — Inpatient Hospital Stay (HOSPITAL_COMMUNITY): Payer: PPO

## 2015-01-30 ENCOUNTER — Inpatient Hospital Stay (HOSPITAL_COMMUNITY)
Admission: EM | Admit: 2015-01-30 | Discharge: 2015-02-16 | DRG: 682 | Disposition: A | Discharge status: Home Health | Payer: PPO | Attending: Internal Medicine | Admitting: Internal Medicine

## 2015-01-30 ENCOUNTER — Other Ambulatory Visit: Payer: Self-pay | Admitting: Nurse Practitioner

## 2015-01-30 ENCOUNTER — Ambulatory Visit
Admission: RE | Admit: 2015-01-30 | Discharge: 2015-01-30 | Disposition: A | Discharge status: Home / Self Care | Payer: PPO | Source: Ambulatory Visit | Attending: Nurse Practitioner | Admitting: Nurse Practitioner

## 2015-01-30 ENCOUNTER — Encounter (HOSPITAL_COMMUNITY): Payer: Self-pay | Admitting: Radiology

## 2015-01-30 DIAGNOSIS — I82409 Acute embolism and thrombosis of unspecified deep veins of unspecified lower extremity: Secondary | ICD-10-CM | POA: Diagnosis present

## 2015-01-30 DIAGNOSIS — I272 Other secondary pulmonary hypertension: Secondary | ICD-10-CM | POA: Diagnosis present

## 2015-01-30 DIAGNOSIS — N2581 Secondary hyperparathyroidism of renal origin: Secondary | ICD-10-CM | POA: Diagnosis present

## 2015-01-30 DIAGNOSIS — N19 Unspecified kidney failure: Secondary | ICD-10-CM | POA: Diagnosis present

## 2015-01-30 DIAGNOSIS — N186 End stage renal disease: Secondary | ICD-10-CM | POA: Diagnosis present

## 2015-01-30 DIAGNOSIS — N170 Acute kidney failure with tubular necrosis: Secondary | ICD-10-CM | POA: Diagnosis present

## 2015-01-30 DIAGNOSIS — N400 Enlarged prostate without lower urinary tract symptoms: Secondary | ICD-10-CM | POA: Diagnosis present

## 2015-01-30 DIAGNOSIS — K5900 Constipation, unspecified: Secondary | ICD-10-CM | POA: Diagnosis not present

## 2015-01-30 DIAGNOSIS — Z85828 Personal history of other malignant neoplasm of skin: Secondary | ICD-10-CM

## 2015-01-30 DIAGNOSIS — M1990 Unspecified osteoarthritis, unspecified site: Secondary | ICD-10-CM | POA: Diagnosis present

## 2015-01-30 DIAGNOSIS — J449 Chronic obstructive pulmonary disease, unspecified: Secondary | ICD-10-CM | POA: Diagnosis present

## 2015-01-30 DIAGNOSIS — R34 Anuria and oliguria: Secondary | ICD-10-CM | POA: Diagnosis present

## 2015-01-30 DIAGNOSIS — Z87891 Personal history of nicotine dependence: Secondary | ICD-10-CM

## 2015-01-30 DIAGNOSIS — R140 Abdominal distension (gaseous): Secondary | ICD-10-CM

## 2015-01-30 DIAGNOSIS — J9601 Acute respiratory failure with hypoxia: Secondary | ICD-10-CM | POA: Diagnosis present

## 2015-01-30 DIAGNOSIS — R5383 Other fatigue: Secondary | ICD-10-CM

## 2015-01-30 DIAGNOSIS — J438 Other emphysema: Secondary | ICD-10-CM | POA: Diagnosis present

## 2015-01-30 DIAGNOSIS — I120 Hypertensive chronic kidney disease with stage 5 chronic kidney disease or end stage renal disease: Secondary | ICD-10-CM | POA: Diagnosis present

## 2015-01-30 DIAGNOSIS — R0600 Dyspnea, unspecified: Secondary | ICD-10-CM | POA: Diagnosis not present

## 2015-01-30 DIAGNOSIS — D649 Anemia, unspecified: Secondary | ICD-10-CM | POA: Diagnosis present

## 2015-01-30 DIAGNOSIS — I10 Essential (primary) hypertension: Secondary | ICD-10-CM | POA: Diagnosis not present

## 2015-01-30 DIAGNOSIS — C9000 Multiple myeloma not having achieved remission: Secondary | ICD-10-CM | POA: Diagnosis present

## 2015-01-30 DIAGNOSIS — N185 Chronic kidney disease, stage 5: Secondary | ICD-10-CM | POA: Diagnosis not present

## 2015-01-30 DIAGNOSIS — J45909 Unspecified asthma, uncomplicated: Secondary | ICD-10-CM | POA: Diagnosis present

## 2015-01-30 DIAGNOSIS — Z452 Encounter for adjustment and management of vascular access device: Secondary | ICD-10-CM

## 2015-01-30 DIAGNOSIS — E875 Hyperkalemia: Secondary | ICD-10-CM | POA: Diagnosis present

## 2015-01-30 DIAGNOSIS — N179 Acute kidney failure, unspecified: Secondary | ICD-10-CM | POA: Diagnosis not present

## 2015-01-30 DIAGNOSIS — Z992 Dependence on renal dialysis: Secondary | ICD-10-CM

## 2015-01-30 DIAGNOSIS — J810 Acute pulmonary edema: Secondary | ICD-10-CM | POA: Diagnosis not present

## 2015-01-30 DIAGNOSIS — N028 Recurrent and persistent hematuria with other morphologic changes: Secondary | ICD-10-CM | POA: Diagnosis not present

## 2015-01-30 HISTORY — DX: Personal history of colonic polyps: Z86.010

## 2015-01-30 HISTORY — DX: Benign prostatic hyperplasia without lower urinary tract symptoms: N40.0

## 2015-01-30 HISTORY — DX: Other specified postprocedural states: Z98.890

## 2015-01-30 HISTORY — DX: Unspecified osteoarthritis, unspecified site: M19.90

## 2015-01-30 HISTORY — DX: Other complications of anesthesia, initial encounter: T88.59XA

## 2015-01-30 HISTORY — DX: Headache: R51

## 2015-01-30 HISTORY — DX: Acute embolism and thrombosis of unspecified deep veins of unspecified lower extremity: I82.409

## 2015-01-30 HISTORY — DX: Essential (primary) hypertension: I10

## 2015-01-30 HISTORY — DX: Allergic rhinitis, unspecified: J30.9

## 2015-01-30 HISTORY — DX: Unspecified asthma, uncomplicated: J45.909

## 2015-01-30 HISTORY — DX: Chronic kidney disease, unspecified: N18.9

## 2015-01-30 HISTORY — DX: Adverse effect of unspecified anesthetic, initial encounter: T41.45XA

## 2015-01-30 HISTORY — DX: Personal history of other diseases of the digestive system: Z87.19

## 2015-01-30 HISTORY — DX: Headache, unspecified: R51.9

## 2015-01-30 HISTORY — DX: Personal history of urinary calculi: Z87.442

## 2015-01-30 HISTORY — DX: Chronic obstructive pulmonary disease, unspecified: J44.9

## 2015-01-30 LAB — CBC WITH DIFFERENTIAL/PLATELET
BASOS PCT: 1 % (ref 0–1)
Basophils Absolute: 0.1 10*3/uL (ref 0.0–0.1)
Eosinophils Absolute: 0.3 10*3/uL (ref 0.0–0.7)
Eosinophils Relative: 3 % (ref 0–5)
HCT: 31.1 % — ABNORMAL LOW (ref 39.0–52.0)
HEMOGLOBIN: 11.1 g/dL — AB (ref 13.0–17.0)
Lymphocytes Relative: 24 % (ref 12–46)
Lymphs Abs: 2.3 10*3/uL (ref 0.7–4.0)
MCH: 33.5 pg (ref 26.0–34.0)
MCHC: 35.7 g/dL (ref 30.0–36.0)
MCV: 94 fL (ref 78.0–100.0)
MONO ABS: 0.9 10*3/uL (ref 0.1–1.0)
MONOS PCT: 10 % (ref 3–12)
Neutro Abs: 5.8 10*3/uL (ref 1.7–7.7)
Neutrophils Relative %: 62 % (ref 43–77)
Platelets: 113 10*3/uL — ABNORMAL LOW (ref 150–400)
RBC: 3.31 MIL/uL — AB (ref 4.22–5.81)
RDW: 13.7 % (ref 11.5–15.5)
WBC: 9.4 10*3/uL (ref 4.0–10.5)

## 2015-01-30 LAB — COMPREHENSIVE METABOLIC PANEL
ALBUMIN: 3.9 g/dL (ref 3.5–5.0)
ALK PHOS: 64 U/L (ref 38–126)
ALT: 28 U/L (ref 17–63)
AST: 28 U/L (ref 15–41)
Anion gap: 20 — ABNORMAL HIGH (ref 5–15)
BUN: 76 mg/dL — ABNORMAL HIGH (ref 6–20)
CO2: 19 mmol/L — ABNORMAL LOW (ref 22–32)
CREATININE: 12.69 mg/dL — AB (ref 0.61–1.24)
Calcium: 8.9 mg/dL (ref 8.9–10.3)
Chloride: 107 mmol/L (ref 101–111)
GFR calc Af Amer: 4 mL/min — ABNORMAL LOW (ref >60)
GFR, EST NON AFRICAN AMERICAN: 3 mL/min — AB (ref >60)
Glucose, Bld: 100 mg/dL — ABNORMAL HIGH (ref 65–99)
POTASSIUM: 5.2 mmol/L — AB (ref 3.5–5.1)
Sodium: 146 mmol/L — ABNORMAL HIGH (ref 135–145)
Total Bilirubin: 1.5 mg/dL — ABNORMAL HIGH (ref 0.3–1.2)
Total Protein: 6.5 g/dL (ref 6.5–8.1)

## 2015-01-30 LAB — URINE MICROSCOPIC-ADD ON

## 2015-01-30 LAB — CBC
HEMATOCRIT: 31.5 % — AB (ref 39.0–52.0)
HEMOGLOBIN: 11 g/dL — AB (ref 13.0–17.0)
MCH: 33.3 pg (ref 26.0–34.0)
MCHC: 34.9 g/dL (ref 30.0–36.0)
MCV: 95.5 fL (ref 78.0–100.0)
PLATELETS: 116 10*3/uL — AB (ref 150–400)
RBC: 3.3 MIL/uL — AB (ref 4.22–5.81)
RDW: 13.9 % (ref 11.5–15.5)
WBC: 10.3 10*3/uL (ref 4.0–10.5)

## 2015-01-30 LAB — URINALYSIS, ROUTINE W REFLEX MICROSCOPIC
BILIRUBIN URINE: NEGATIVE
GLUCOSE, UA: NEGATIVE mg/dL
KETONES UR: NEGATIVE mg/dL
Nitrite: NEGATIVE
Protein, ur: 100 mg/dL — AB
Specific Gravity, Urine: 1.008 (ref 1.005–1.030)
UROBILINOGEN UA: 0.2 mg/dL (ref 0.0–1.0)
pH: 7.5 (ref 5.0–8.0)

## 2015-01-30 LAB — CREATININE, SERUM
Creatinine, Ser: 13.09 mg/dL — ABNORMAL HIGH (ref 0.61–1.24)
GFR calc non Af Amer: 3 mL/min — ABNORMAL LOW (ref >60)
GFR, EST AFRICAN AMERICAN: 4 mL/min — AB (ref >60)

## 2015-01-30 LAB — MRSA PCR SCREENING: MRSA by PCR: NEGATIVE

## 2015-01-30 MED ORDER — FLUTICASONE PROPIONATE 50 MCG/ACT NA SUSP
1.0000 | Freq: Every day | NASAL | Status: DC
  Administered 2015-01-31 – 2015-02-16 (×13): 1 via NASAL
  Filled 2015-01-30: qty 16

## 2015-01-30 MED ORDER — FUROSEMIDE 10 MG/ML IJ SOLN
120.0000 mg | Freq: Once | INTRAVENOUS | Status: AC
  Administered 2015-01-30: 120 mg via INTRAVENOUS
  Filled 2015-01-30: qty 12

## 2015-01-30 MED ORDER — POLYETHYLENE GLYCOL 3350 17 G PO PACK
17.0000 g | PACK | Freq: Every day | ORAL | Status: DC | PRN
Start: 2015-01-30 — End: 2015-02-16
  Administered 2015-02-11: 17 g via ORAL
  Filled 2015-01-30: qty 1

## 2015-01-30 MED ORDER — IPRATROPIUM-ALBUTEROL 0.5-2.5 (3) MG/3ML IN SOLN
3.0000 mL | Freq: Once | RESPIRATORY_TRACT | Status: AC
  Administered 2015-01-30: 3 mL via RESPIRATORY_TRACT

## 2015-01-30 MED ORDER — ACETAMINOPHEN 650 MG RE SUPP
650.0000 mg | Freq: Four times a day (QID) | RECTAL | Status: DC | PRN
  Filled 2015-01-30: qty 1

## 2015-01-30 MED ORDER — AMLODIPINE BESYLATE 5 MG PO TABS
5.0000 mg | ORAL_TABLET | Freq: Every day | ORAL | Status: DC
  Administered 2015-01-31: 5 mg via ORAL
  Filled 2015-01-30 (×2): qty 1

## 2015-01-30 MED ORDER — BUDESONIDE-FORMOTEROL FUMARATE 80-4.5 MCG/ACT IN AERO
2.0000 | INHALATION_SPRAY | Freq: Two times a day (BID) | RESPIRATORY_TRACT | Status: DC
  Administered 2015-01-30 – 2015-02-16 (×26): 2 via RESPIRATORY_TRACT
  Filled 2015-01-30 (×2): qty 6.9

## 2015-01-30 MED ORDER — SODIUM CHLORIDE 0.9 % IJ SOLN
3.0000 mL | Freq: Two times a day (BID) | INTRAMUSCULAR | Status: DC
  Administered 2015-01-31 – 2015-02-09 (×9): 3 mL via INTRAVENOUS

## 2015-01-30 MED ORDER — IOHEXOL 300 MG/ML  SOLN
50.0000 mL | Freq: Once | INTRAMUSCULAR | Status: AC | PRN
  Administered 2015-01-30: 50 mL via ORAL

## 2015-01-30 MED ORDER — ATENOLOL 25 MG PO TABS
25.0000 mg | ORAL_TABLET | Freq: Every day | ORAL | Status: DC
  Administered 2015-01-31: 25 mg via ORAL
  Filled 2015-01-30 (×2): qty 1

## 2015-01-30 MED ORDER — SODIUM CHLORIDE 0.9 % IV BOLUS (SEPSIS)
1000.0000 mL | Freq: Once | INTRAVENOUS | Status: AC
  Administered 2015-01-30: 1000 mL via INTRAVENOUS

## 2015-01-30 MED ORDER — ACETAMINOPHEN 325 MG PO TABS
650.0000 mg | ORAL_TABLET | Freq: Four times a day (QID) | ORAL | Status: DC | PRN
  Administered 2015-02-06 (×2): 650 mg via ORAL
  Filled 2015-01-30 (×2): qty 2

## 2015-01-30 MED ORDER — BISACODYL 10 MG RE SUPP
10.0000 mg | Freq: Every day | RECTAL | Status: DC | PRN
  Administered 2015-02-11: 10 mg via RECTAL
  Filled 2015-01-30: qty 1

## 2015-01-30 MED ORDER — HEPARIN SODIUM (PORCINE) 5000 UNIT/ML IJ SOLN
5000.0000 [IU] | Freq: Three times a day (TID) | INTRAMUSCULAR | Status: DC
  Administered 2015-01-30 – 2015-01-31 (×2): 5000 [IU] via SUBCUTANEOUS
  Filled 2015-01-30 (×5): qty 1

## 2015-01-30 MED ORDER — ONDANSETRON HCL 4 MG PO TABS
4.0000 mg | ORAL_TABLET | Freq: Four times a day (QID) | ORAL | Status: DC | PRN
  Filled 2015-01-30: qty 1

## 2015-01-30 MED ORDER — ZOLPIDEM TARTRATE 5 MG PO TABS
5.0000 mg | ORAL_TABLET | Freq: Once | ORAL | Status: AC
  Administered 2015-01-30: 5 mg via ORAL
  Filled 2015-01-30: qty 1

## 2015-01-30 MED ORDER — SODIUM POLYSTYRENE SULFONATE 15 GM/60ML PO SUSP
30.0000 g | Freq: Once | ORAL | Status: DC
Start: 2015-01-30 — End: 2015-01-31

## 2015-01-30 MED ORDER — ONDANSETRON HCL 4 MG/2ML IJ SOLN: 4.0000 mg | Freq: Four times a day (QID) | INTRAMUSCULAR | Status: DC | PRN

## 2015-01-30 NOTE — ED Notes (Signed)
Patient transported to CT 

## 2015-01-30 NOTE — ED Provider Notes (Signed)
CSN: [id number]     Arrival date & time 01/30/15  1324 History   First MD Initiated Contact with Patient 01/30/15 1325     Chief Complaint  Patient presents with  . high creatinine      (Consider location/radiation/quality/duration/timing/severity/associated sxs/prior Treatment) The history is provided by the patient and the spouse.   patient was sent in from Dr. Louis Meckel at [facility] urology who was sent the patient from Dr. Deforest Hoyles. Patient has been feeling somewhat bad for last few days. Somewhat decreased appetite. Has been burping. States he had been urinating frequently including getting up at night up until a few days ago when he stopped urinating. Had been seen at his primary care and is worried for urinary retention. In and out cath done with no return of urine. Lab work was drawn and x-ray was done that showed some air-fluid levels. Sent to urology where they also were not able to obtain urine. Laboratory poorly done showed a potassium of 4.9 WN of 70 and creatinine 12. Patient has no history of renal failure. No fevers or chills. Occasional dull chest pain. Has had some tenderness in his abdomen. States his legs have been swollen. States he has also put on a little bit weight since he has stopped eating and drinking as much. States his legs are more swollen.  Past Medical History  Diagnosis Date  . Hypertension   . COPD (chronic obstructive pulmonary disease)   . Asthma   . Chronic kidney disease   . History of kidney stones   . History of hiatal hernia   . Headache     HISTORY OF CLUSTER HEADACHES  . Arthritis   . Complication of anesthesia     TROUBLE WAKING UP  . H/O colonoscopy with polypectomy   . Allergic rhinitis   . DVT (deep venous thrombosis)     RIGHT CALF AFTER A LITHOTRIPSY  . BPH (benign prostatic hypertrophy)   . Osteoarthritis    Past Surgical History  Procedure Laterality Date  . Lithotripsy    . Back surgery    . Arthroscopy right knee    .  Cholecystectomy    . 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 Hypertension Mother   . Hypertension Father    History  Substance Use Topics  . Smoking status: Former Smoker -- 2.00 packs/day    Types: Cigarettes  . Smokeless tobacco: Never Used  . Alcohol Use: No    Review of Systems  Constitutional: Positive for appetite change and fatigue. Negative for activity change.  Eyes: Negative for pain.  Respiratory: Negative for chest tightness and shortness of breath.   Cardiovascular: Negative for chest pain and leg swelling.  Gastrointestinal: Positive for abdominal pain and abdominal distention. Negative for nausea, vomiting and diarrhea.  Genitourinary: Positive for enuresis. Negative for flank pain.  Musculoskeletal: Negative for back pain and neck stiffness.  Skin: Negative for rash.  Neurological: Negative for weakness, numbness and headaches.  Psychiatric/Behavioral: Negative for behavioral problems.      Allergies  Review of patient's allergies indicates no known allergies.  Home Medications   Prior to Admission medications   Medication Sig Start Date End Date Taking? Authorizing Provider  amLODipine (NORVASC) 5 MG tablet Take 5 mg by mouth daily.   Yes Historical Provider, MD  atenolol (TENORMIN) 50 MG tablet Take 25 mg by mouth daily.   Yes Historical Provider, MD  budesonide-formoterol [facility])  80-4.5 MCG/ACT inhaler Inhale 2 puffs into the lungs 2 (two) times daily.   Yes Historical Provider, MD  Cholecalciferol (VITAMIN D PO) Take 1 tablet by mouth daily.   Yes Historical Provider, MD  fluticasone (FLONASE) 50 MCG/ACT nasal spray Place 1 spray into both nostrils daily.   Yes Historical Provider, MD  hydrochlorothiazide (HYDRODIURIL) 25 MG tablet Take 12.5 mg by mouth daily.   Yes Historical Provider, MD  omeprazole (PRILOSEC) 40 MG capsule Take 40 mg by mouth daily.   Yes Historical Provider, MD   BP 144/67 mmHg   Pulse 79  Temp(Src) 97.3 F (36.3 C) (Oral)  Resp 24  Ht 5\' 6"  (1.676 m)  Wt 209 lb 7 oz (95 kg)  BMI 33.82 kg/m2  SpO2 93% Physical Exam  Constitutional: He is oriented to person, place, and time. He appears well-developed and well-nourished.  HENT:  Head: Normocephalic and atraumatic.  Eyes: EOM are normal. Pupils are equal, round, and reactive to light.  Neck: Normal range of motion. Neck supple.  Cardiovascular: Normal rate, regular rhythm and normal heart sounds.   No murmur heard. Pulmonary/Chest: Effort normal and breath sounds normal.  Abdominal: Soft. He exhibits distension. He exhibits no mass. There is tenderness. There is no rebound and no guarding.  Patient appears mildly distended. Patient states it is larger than normal. Mild tenderness that may be worse in the right lower quadrant.  Musculoskeletal: Normal range of motion. He exhibits edema.  Mild edema to bilateral lower legs.  Neurological: He is alert and oriented to person, place, and time. No cranial nerve deficit.  Skin: Skin is warm and dry.  Psychiatric: He has a normal mood and affect.  Nursing note and vitals reviewed.   ED Course  Procedures (including critical care time) Labs Review Labs Reviewed  COMPREHENSIVE METABOLIC PANEL - Abnormal; Notable for the following:    Sodium 146 (*)    Potassium 5.2 (*)    CO2 19 (*)    Glucose, Bld 100 (*)    BUN 76 (*)    Creatinine, Ser 12.69 (*)    Total Bilirubin 1.5 (*)    GFR calc non Af Amer 3 (*)    GFR calc Af Amer 4 (*)    Anion gap 20 (*)    All other components within normal limits  URINALYSIS, ROUTINE W REFLEX MICROSCOPIC (NOT AT [facility]) - Abnormal; Notable for the following:    APPearance CLOUDY (*)    Hgb urine dipstick LARGE (*)    Protein, ur 100 (*)    Leukocytes, UA SMALL (*)    All other components within normal limits  CBC WITH DIFFERENTIAL/PLATELET - Abnormal; Notable for the following:    RBC 3.31 (*)    Hemoglobin 11.1 (*)    HCT  31.1 (*)    Platelets 113 (*)    All other components within normal limits  URINE MICROSCOPIC-ADD ON - Abnormal; Notable for the following:    Bacteria, UA FEW (*)    All other components within normal limits  PROTEIN ELECTROPHORESIS, SERUM - Abnormal; Notable for the following:    Total Protein ELP 5.8 (*)    M-Spike, % 0.2 (*)    All other components within normal limits  CBC - Abnormal; Notable for the following:    RBC 3.30 (*)    Hemoglobin 11.0 (*)    HCT 31.5 (*)    Platelets 116 (*)    All other components within normal limits  CREATININE, SERUM -  Abnormal; Notable for the following:    Creatinine, Ser 13.09 (*)    GFR calc non Af Amer 3 (*)    GFR calc Af Amer 4 (*)    All other components within normal limits  CBC - Abnormal; Notable for the following:    RBC 3.18 (*)    Hemoglobin 10.5 (*)    HCT 30.2 (*)    Platelets 109 (*)    All other components within normal limits  RENAL FUNCTION PANEL - Abnormal; Notable for the following:    Potassium 5.5 (*)    CO2 20 (*)    Glucose, Bld 127 (*)    BUN 80 (*)    Creatinine, Ser 14.38 (*)    Calcium 8.6 (*)    Phosphorus 8.6 (*)    Albumin 3.3 (*)    GFR calc non Af Amer 3 (*)    GFR calc Af Amer 3 (*)    Anion gap 16 (*)    All other components within normal limits  PROTEIN / CREATININE RATIO, URINE - Abnormal; Notable for the following:    Protein Creatinine Ratio 5.50 (*)    All other components within normal limits  MRSA PCR SCREENING  C3 COMPLEMENT  C4 COMPLEMENT  IMMUNOFIXATION ELECTROPHORESIS, URINE (WITH TOT PROT)  ANCA TITERS  GLOMERULAR BASEMENT MEMBRANE ANTIBODIES  HEPATITIS B SURFACE ANTIGEN  HEPATITIS B CORE ANTIBODY, IGM  HEPATITIS B SURFACE ANTIBODY, QUANTITATIVE  ANTINUCLEAR ANTIBODIES, IFA    Imaging Review Ct Abdomen Pelvis Wo Contrast  01/30/2015   CLINICAL DATA:  81 year old male with abdominal distention, renal failure, right flank pain, oliguria. Initial encounter.  EXAM: CT ABDOMEN AND  PELVIS WITHOUT CONTRAST  TECHNIQUE: Multidetector CT imaging of the abdomen and pelvis was performed following the standard protocol without IV contrast.  COMPARISON:  CT Abdomen and Pelvis 05/29/2014 and earlier.  FINDINGS: New small layering right pleural effusion. Trace layering left pleural effusion. No pericardial effusion. Septal thickening at both lung bases. Mild dependent atelectasis.  Stable visualized osseous structures. L4 hemivertebra type appearance re- identified.  Stable fat containing left inguinal hernia. No pelvic free fluid. Foley catheter in the bladder which is decompressed. Decompressed distal colon.  Sigmoid diverticulosis with no active inflammation. Occasional diverticula in the more proximal colon without active inflammation. Fluid in the ascending colon. Negative appendix and terminal ileum. No dilated small bowel. Oral contrast in the stomach and proximal small bowel, has not yet reached the distal small bowel. Mildly distended stomach.  No pneumoperitoneum. No free fluid in the abdomen. Noncontrast liver remarkable for decreased density in keeping with steatosis. Surgically absent gallbladder. Negative non contrast spleen, pancreas and adrenal glands. Aortoiliac calcified atherosclerosis noted. Mild ectasia of the infrarenal aorta is stable.  Negative non contrast right kidney. No right nephrolithiasis, hydronephrosis, or perinephric stranding. Stable left kidney with midpole cyst. No left hydronephrosis or renal calculus.  No hydroureter, but there is mild bilateral periureteral stranding (series 2, images 47-49). This does not continue all the way to the ureterovesical junction. The appearance is nonspecific. There is no lymphadenopathy in the abdomen or pelvis.  IMPRESSION: 1. New small layering pleural effusions and pulmonary septal thickening at the lung bases compatible with a degree of interstitial edema. 2. No obstructive uropathy or urologic calculus. Mild nonspecific bilateral  periureteral stranding. Query ascending urinary tract infection. Bladder decompressed by Foley catheter. 3. Otherwise no acute or inflammatory changes in the abdomen or pelvis. Hepatic steatosis. Diverticulosis of the colon.   Electronically  Signed   By: Genevie Ann M.D.   On: 01/30/2015 15:43   Dg Chest 2 View  01/30/2015   CLINICAL DATA:  Fatigue.  Shortness of breath.  EXAM: CHEST  2 VIEW  COMPARISON:  11/08/2014.  FINDINGS: Mediastinum and hilar structures are normal. No focal infiltrate. Cardiomegaly with mild pulmonary vascular prominence. Mild interstitial prominence. Small right pleural effusion. No acute bony abnormality.  IMPRESSION: 1. Cardiomegaly with mild pulmonary vascular prominence and interstitial prominence. Mild congestive heart failure cannot be excluded . 2. Small right pleural effusion .   Electronically Signed   By: Marcello Moores  Register   On: 01/30/2015 11:26   Dg Abd 1 View  01/30/2015   CLINICAL DATA:  Abdominal distention.  EXAM: ABDOMEN - 1 VIEW  COMPARISON:  None.  FINDINGS: Cholecystectomy. Soft tissue structures are unremarkable. Nondilated air-filled loops of small bowel are noted. Colonic gas pattern is unremarkable. No free air. Pelvic calcifications consistent phleboliths. Degenerative changes and scoliosis lumbar spine. Degenerative changes both hips.  IMPRESSION: Multiple air-filled loops of nondilated small bowel. Follow-up abdominal series is suggested to exclude developing small bowel distention.   Electronically Signed   By: Marcello Moores  Register   On: 01/30/2015 11:28   Dg Chest Port 1 View  01/31/2015   CLINICAL DATA:  Initial encounter for hemodialysis catheter placement.  EXAM: PORTABLE CHEST - 1 VIEW  COMPARISON:  01/30/2015.  FINDINGS: 1142 hrs. Left IJ dialysis catheter tip overlies the left hilum. The tip projects slightly more lateral than typical seen which is probably related to rightward patient rotation. There is pulmonary vascular congestion without overt pulmonary  edema. No focal airspace consolidation or pleural effusion. Cardiopericardial silhouette is at upper limits of normal for size. Telemetry leads overlie the chest. No evidence for pneumothorax.  IMPRESSION: 1. No evidence for pneumothorax status post central line placement.   Electronically Signed   By: Misty Stanley M.D.   On: 01/31/2015 12:02   Dg Chest Port 1 View  01/30/2015   CLINICAL DATA:  Dyspnea and high creatinine level  EXAM: PORTABLE CHEST - 1 VIEW  COMPARISON:  01/30/2015  FINDINGS: Pulmonary edema has developed since earlier today. No cardiomegaly when accounting for lower mediastinal fat. Negative aortic and hilar contours. Trace right pleural effusion.  IMPRESSION: Pulmonary edema, new from 11:30 a.m. earlier today.   Electronically Signed   By: Monte Fantasia M.D.   On: 01/30/2015 17:14     EKG Interpretation   Date/Time:  Tuesday January 30 2015 14:39:56 EDT Ventricular Rate:  75 PR Interval:  149 QRS Duration: 81 QT Interval:  403 QTC Calculation: 450 R Axis:   41 Text Interpretation:  Sinus rhythm T waves more peaked than prior in 2006  Confirmed by Alvino Chapel  MD, Ovid Curd [phone]) on 01/30/2015 2:44:09 PM      MDM   Final diagnoses:  Renal failure    Patient with renal failure. No urinary obstruction. Creatinine elevated. Discussed with nephrology she requests transfer to [facility].    Davonna Belling, MD 01/31/15 [phone]

## 2015-01-30 NOTE — Consult Note (Signed)
Referring Provider: No ref. provider found Primary Care Physician:  Wenda Low, MD Primary Nephrologist:    Reason for Consultation:  Acute renal failure  HPI: 81 y.o. male, with past medical history of hypertension, renal stone, patient went to see his PCP today as he was not feeling well last 2 days, fatigued, with decreased urine output, labs were showing creatinine of 12.6, most recent lab at PCP office showing creatinine in October 2015 of 1.17 , patient denies any dysuria, polyuria, reports decreased urine output over the last 2 days, denies fever, chills, rash, flank pain, CT abdomen and pelvis does not show evidence of renal stones or hydronephrosis, only mild pre-ureteral stranding, patient was seen by urology Dr.Herrick, where he had Foley catheter inserted with minimal urine output(currently 150 mL in leg bag), hospitalist requested to admit the patient for further workup.  Patient was in usual state of good health mooing 2 acre yard.   No ACE or ARB  No administration of nephrotoxins  Arthritis  Uses ibuprofen daily  Some prostatism mild nocturia    Has no obstruction   Past Medical History  Diagnosis Date  . Hypertension   . COPD (chronic obstructive pulmonary disease)   . Asthma   . Chronic kidney disease   . History of kidney stones   . History of hiatal hernia   . Headache     HISTORY OF CLUSTER HEADACHES  . Arthritis   . Complication of anesthesia     TROUBLE WAKING UP  . H/O colonoscopy with polypectomy   . Allergic rhinitis   . DVT (deep venous thrombosis)     RIGHT CALF AFTER A LITHOTRIPSY  . BPH (benign prostatic hypertrophy)   . Osteoarthritis     Past Surgical History  Procedure Laterality Date  . Lithotripsy    . Back surgery    . Arthroscopy right knee    . Cholecystectomy    . 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 Authorizing Provider  amLODipine (NORVASC) 5 MG tablet  Take 5 mg by mouth daily.   Yes Historical Provider, MD  atenolol (TENORMIN) 50 MG tablet Take 25 mg by mouth daily.   Yes Historical Provider, MD  budesonide-formoterol (SYMBICORT) 80-4.5 MCG/ACT inhaler Inhale 2 puffs into the lungs 2 (two) times daily.   Yes Historical Provider, MD  Cholecalciferol (VITAMIN D PO) Take 1 tablet by mouth daily.   Yes Historical Provider, MD  fluticasone (FLONASE) 50 MCG/ACT nasal spray Place 1 spray into both nostrils daily.   Yes Historical Provider, MD  hydrochlorothiazide (HYDRODIURIL) 25 MG tablet Take 12.5 mg by mouth daily.   Yes Historical Provider, MD  omeprazole (PRILOSEC) 40 MG capsule Take 40 mg by mouth daily.   Yes Historical Provider, MD    Current Facility-Administered Medications  Medication Dose Route Frequency Provider Last Rate Last Dose  . acetaminophen (TYLENOL) tablet 650 mg  650 mg Oral Q6H PRN Albertine Patricia, MD       Or  . acetaminophen (TYLENOL) suppository 650 mg  650 mg Rectal Q6H PRN Albertine Patricia, MD      . Derrill Memo ON 01/31/2015] amLODipine (NORVASC) tablet 5 mg  5 mg Oral Daily Albertine Patricia, MD      . Derrill Memo ON 01/31/2015] atenolol (TENORMIN) tablet 25 mg  25 mg Oral Daily Albertine Patricia, MD      . bisacodyl (DULCOLAX) suppository 10  mg  10 mg Rectal Daily PRN Albertine Patricia, MD      . budesonide-formoterol (SYMBICORT) 80-4.5 MCG/ACT inhaler 2 puff  2 puff Inhalation BID Albertine Patricia, MD      . Derrill Memo ON 01/31/2015] fluticasone (FLONASE) 50 MCG/ACT nasal spray 1 spray  1 spray Each Nare Daily Dawood S Elgergawy, MD      . heparin injection 5,000 Units  5,000 Units Subcutaneous 3 times per day Albertine Patricia, MD      . ondansetron (ZOFRAN) tablet 4 mg  4 mg Oral Q6H PRN Albertine Patricia, MD       Or  . ondansetron (ZOFRAN) injection 4 mg  4 mg Intravenous Q6H PRN Dawood S Elgergawy, MD      . polyethylene glycol (MIRALAX / GLYCOLAX) packet 17 g  17 g Oral Daily PRN Dawood S Elgergawy, MD      .  sodium chloride 0.9 % injection 3 mL  3 mL Intravenous Q12H Dawood S Elgergawy, MD      . sodium polystyrene (KAYEXALATE) 15 GM/60ML suspension 30 g  30 g Oral Once Albertine Patricia, MD        Allergies as of 01/30/2015  . (No Known Allergies)    Family History  Problem Relation Age of Onset  . Hypertension Mother   . Hypertension Father     History   Social History  . Marital Status: Married    Spouse Name: N/A  . Number of Children: N/A  . Years of Education: N/A   Occupational History  . Not on file.   Social History Main Topics  . Smoking status: Former Smoker -- 2.00 packs/day    Types: Cigarettes  . Smokeless tobacco: Never Used  . Alcohol Use: No  . Drug Use: No  . Sexual Activity: No   Other Topics Concern  . Not on file   Social History Narrative    Review of Systems: Gen: Denies any fever, chills, sweats, anorexia, + fatigue, + weakness, + malaise HEENT: No visual complaints, No history of Retinopathy. Normal external appearance No Epistaxis or Sore throat. No sinusitis.   CV: Denies chest pain, angina, palpitations, syncope, orthopnea, PND, peripheral edema, and claudication. Resp: Dyspnea and cough. GI: Denies vomiting blood, jaundice, and fecal incontinence.   Denies dysphagia or odynophagia. GU :Foley catheter MS:  DJD uses NSAIDS Derm:  No rash Psych: Denies depression, anxiety, memory loss, suicidal ideation, hallucinations, paranoia, and confusion. Heme: Denies bruising, bleeding, and enlarged lymph nodes. Neuro: No headache.  No diplopia. No dysarthria.  No dysphasia.  No history of CVA.  No Seizures. No paresthesias.  No weakness. Endocrine No DM.  No Thyroid disease.  No Adrenal disease.  Physical Exam: Vital signs in last 24 hours: Temp:  [97.7 F (36.5 C)] 97.7 F (36.5 C) (06/21 1344) Pulse Rate:  [72-84] 81 (06/21 1700) Resp:  [16-31] 22 (06/21 1700) BP: (129-165)/(56-103) 150/61 mmHg (06/21 1859) SpO2:  [89 %-95 %] 94 % (06/21  1802) Weight:  [95.8 kg (211 lb 3.2 oz)] 95.8 kg (211 lb 3.2 oz) (06/21 1859)   General:    Comfortable at rest Head:  Normocephalic and atraumatic. Eyes:  Sclera clear, no icterus.   Conjunctiva pink. Ears:  Normal auditory acuity. Nose:  No deformity, discharge,  or lesions. Nasal cannula Mouth:  No deformity or lesions, dentition normal. Neck:  Supple; no masses or thyromegaly. JVP not elevated Lungs:  Diminished breath sounds Heart:  Regular rate and  rhythm; no murmurs, clicks, rubs,  or gallops. Abdomen:   Distended and tympanic Msk:  Symmetrical without gross deformities. Normal posture. Pulses:  No carotid, renal, femoral bruits. DP and PT symmetrical and equal Extremities:  Without clubbing or edema. .   Intake/Output from previous day:   Intake/Output this shift:    Lab Results:  Recent Labs  01/30/15 1435  WBC 9.4  HGB 11.1*  HCT 31.1*  PLT 113*   BMET  Recent Labs  01/30/15 1435  NA 146*  K 5.2*  CL 107  CO2 19*  GLUCOSE 100*  BUN 76*  CREATININE 12.69*  CALCIUM 8.9   LFT  Recent Labs  01/30/15 1435  PROT 6.5  ALBUMIN 3.9  AST 28  ALT 28  ALKPHOS 64  BILITOT 1.5*   PT/INR No results for input(s): LABPROT, INR in the last 72 hours. Hepatitis Panel No results for input(s): HEPBSAG, HCVAB, HEPAIGM, HEPBIGM in the last 72 hours.  Studies/Results: Ct Abdomen Pelvis Wo Contrast  01/30/2015   CLINICAL DATA:  81 year old male with abdominal distention, renal failure, right flank pain, oliguria. Initial encounter.  EXAM: CT ABDOMEN AND PELVIS WITHOUT CONTRAST  TECHNIQUE: Multidetector CT imaging of the abdomen and pelvis was performed following the standard protocol without IV contrast.  COMPARISON:  CT Abdomen and Pelvis 05/29/2014 and earlier.  FINDINGS: New small layering right pleural effusion. Trace layering left pleural effusion. No pericardial effusion. Septal thickening at both lung bases. Mild dependent atelectasis.  Stable visualized  osseous structures. L4 hemivertebra type appearance re- identified.  Stable fat containing left inguinal hernia. No pelvic free fluid. Foley catheter in the bladder which is decompressed. Decompressed distal colon.  Sigmoid diverticulosis with no active inflammation. Occasional diverticula in the more proximal colon without active inflammation. Fluid in the ascending colon. Negative appendix and terminal ileum. No dilated small bowel. Oral contrast in the stomach and proximal small bowel, has not yet reached the distal small bowel. Mildly distended stomach.  No pneumoperitoneum. No free fluid in the abdomen. Noncontrast liver remarkable for decreased density in keeping with steatosis. Surgically absent gallbladder. Negative non contrast spleen, pancreas and adrenal glands. Aortoiliac calcified atherosclerosis noted. Mild ectasia of the infrarenal aorta is stable.  Negative non contrast right kidney. No right nephrolithiasis, hydronephrosis, or perinephric stranding. Stable left kidney with midpole cyst. No left hydronephrosis or renal calculus.  No hydroureter, but there is mild bilateral periureteral stranding (series 2, images 47-49). This does not continue all the way to the ureterovesical junction. The appearance is nonspecific. There is no lymphadenopathy in the abdomen or pelvis.  IMPRESSION: 1. New small layering pleural effusions and pulmonary septal thickening at the lung bases compatible with a degree of interstitial edema. 2. No obstructive uropathy or urologic calculus. Mild nonspecific bilateral periureteral stranding. Query ascending urinary tract infection. Bladder decompressed by Foley catheter. 3. Otherwise no acute or inflammatory changes in the abdomen or pelvis. Hepatic steatosis. Diverticulosis of the colon.   Electronically Signed   By: Genevie Ann M.D.   On: 01/30/2015 15:43   Dg Chest 2 View  01/30/2015   CLINICAL DATA:  Fatigue.  Shortness of breath.  EXAM: CHEST  2 VIEW  COMPARISON:   11/08/2014.  FINDINGS: Mediastinum and hilar structures are normal. No focal infiltrate. Cardiomegaly with mild pulmonary vascular prominence. Mild interstitial prominence. Small right pleural effusion. No acute bony abnormality.  IMPRESSION: 1. Cardiomegaly with mild pulmonary vascular prominence and interstitial prominence. Mild congestive heart failure cannot be excluded .  2. Small right pleural effusion .   Electronically Signed   By: Marcello Moores  Register   On: 01/30/2015 11:26   Dg Abd 1 View  01/30/2015   CLINICAL DATA:  Abdominal distention.  EXAM: ABDOMEN - 1 VIEW  COMPARISON:  None.  FINDINGS: Cholecystectomy. Soft tissue structures are unremarkable. Nondilated air-filled loops of small bowel are noted. Colonic gas pattern is unremarkable. No free air. Pelvic calcifications consistent phleboliths. Degenerative changes and scoliosis lumbar spine. Degenerative changes both hips.  IMPRESSION: Multiple air-filled loops of nondilated small bowel. Follow-up abdominal series is suggested to exclude developing small bowel distention.   Electronically Signed   By: Marcello Moores  Register   On: 01/30/2015 11:28   Dg Chest Port 1 View  01/30/2015   CLINICAL DATA:  Dyspnea and high creatinine level  EXAM: PORTABLE CHEST - 1 VIEW  COMPARISON:  01/30/2015  FINDINGS: Pulmonary edema has developed since earlier today. No cardiomegaly when accounting for lower mediastinal fat. Negative aortic and hilar contours. Trace right pleural effusion.  IMPRESSION: Pulmonary edema, new from 11:30 a.m. earlier today.   Electronically Signed   By: Monte Fantasia M.D.   On: 01/30/2015 17:14    Assessment/Plan:  Acute renal failure appears to be puzzling  Some hematuria and pulmonary renal syndrome should be considered as with other causes of acute renal failure. I agree with  A serological evaluation including SPEP and UPEP. He may need a renal biopsy  Hyperkalemia  Mild  Will follow up in AM  Hopefully will not need dialysis    LOS: 0 Taji Sather W @TODAY @8 :13 PM

## 2015-01-30 NOTE — H&P (Addendum)
Patient Demographics  Justin Woods, is a 81 y.o. male  MRN: [id number]   DOB - 12/17/1930  Admit Date - 01/30/2015  Outpatient Primary MD for the patient is Wenda Low, MD   With History of -  Past Medical History  Diagnosis Date  . Hypertension   . COPD (chronic obstructive pulmonary disease)   . Asthma   . Chronic kidney disease   . History of kidney stones   . History of hiatal hernia   . Headache     HISTORY OF CLUSTER HEADACHES  . Arthritis   . Complication of anesthesia     TROUBLE WAKING UP  . H/O colonoscopy with polypectomy   . Allergic rhinitis   . DVT (deep venous thrombosis)     RIGHT CALF AFTER A LITHOTRIPSY  . BPH (benign prostatic hypertrophy)   . Osteoarthritis       Past Surgical History  Procedure Laterality Date  . Lithotripsy    . Back surgery    . Arthroscopy right knee    . Cholecystectomy    . Skin cancer removed from ears      in for   Chief Complaint  Patient presents with  . high creatinine      HPI  Justin Woods  is a 81 y.o. male, with past medical history of hypertension, renal stone, patient went to see his PCP today as he was not feeling well last 2 days, fatigued, with decreased urine output, labs were showing creatinine of 12.6, most recent lab at PCP office showing creatinine in October 2015 of 1.17 , patient denies any dysuria, polyuria, reports decreased urine output over the last 2 days, denies fever, chills, rash, flank pain, CT abdomen and pelvis does not show evidence of renal stones or hydronephrosis, only mild pre-ureteral stranding, patient was seen by urology Dr.Herrick, where he had Foley catheter inserted with minimal urine output(currently 150 mL in leg bag), hospitalist requested to admit the patient for further workup.    Review of Systems    In addition to the HPI above, No Fever-chills, No Headache, No changes with Vision or hearing, No problems swallowing food or Liquids, No Chest pain, Cough  or Shortness of Breath, No Abdominal pain, No Nausea or Vommitting, Bowel movements are regular, No Blood in stool or Urine, Reports decreased urine output No new skin rashes or bruises, No new joints pains-aches,  No new weakness, tingling, numbness in any extremity, reports generalized weakness and fatigue No recent weight gain or loss, No polyuria, polydypsia or polyphagia, No significant Mental Stressors.  A full 10 point Review of Systems was done, except as stated above, all other Review of Systems were negative.   Social History History  Substance Use Topics  . Smoking status: Former Smoker -- 2.00 packs/day    Types: Cigarettes  . Smokeless tobacco: Never Used  . Alcohol Use: No     Family History Family History  Problem Relation Age of Onset  . Hypertension Mother   . Hypertension Father     Prior to Admission medications   Medication Sig Start Date End Date Taking? Authorizing Provider  amLODipine (NORVASC) 5 MG tablet Take 5 mg by mouth daily.   Yes Historical Provider, MD  atenolol (TENORMIN) 50 MG tablet Take 25 mg by mouth daily.   Yes Historical Provider, MD  budesonide-formoterol (SYMBICORT) 80-4.5 MCG/ACT inhaler Inhale 2 puffs into the lungs 2 (two) times daily.   Yes Historical Provider, MD  Cholecalciferol (VITAMIN  D PO) Take 1 tablet by mouth daily.   Yes Historical Provider, MD  fluticasone (FLONASE) 50 MCG/ACT nasal spray Place 1 spray into both nostrils daily.   Yes Historical Provider, MD  hydrochlorothiazide (HYDRODIURIL) 25 MG tablet Take 12.5 mg by mouth daily.   Yes Historical Provider, MD  omeprazole (PRILOSEC) 40 MG capsule Take 40 mg by mouth daily.   Yes Historical Provider, MD    No Known Allergies  Physical Exam  Vitals  Blood pressure 129/56, pulse 72, temperature 97.7 F (36.5 C), resp. rate 16, SpO2 93 %.   1. General ill-appearing male lying in bed in NAD,    2. Normal affect and insight, Not Suicidal or Homicidal, Awake  Alert, Oriented X 3.  3. No F.N deficits, ALL C.Nerves Intact, Strength 5/5 all 4 extremities, Sensation intact all 4 extremities, Plantars down going.  4. Ears and Eyes appear Normal, Conjunctivae clear, PERRLA. Moist Oral Mucosa.  5. Supple Neck, No JVD, No cervical lymphadenopathy appriciated, No Carotid Bruits.  6. Symmetrical Chest wall movement, Good air movement bilaterally,no wheezing.  7. RRR, No Gallops, Rubs or Murmurs, No Parasternal Heave.  8. Positive Bowel Sounds, Abdomen Soft, No tenderness, No organomegaly appriciated,No rebound -guarding or rigidity.  9.  No Cyanosis, Normal Skin Turgor, No Skin Rash or Bruise.  10. Good muscle tone,  joints appear normal , no effusions, Normal ROM.  11. No Palpable Lymph Nodes in Neck or Axillae   Data Review  CBC  Recent Labs Lab 01/30/15 1435  WBC 9.4  HGB 11.1*  HCT 31.1*  PLT 113*  MCV 94.0  MCH 33.5  MCHC 35.7  RDW 13.7  LYMPHSABS 2.3  MONOABS 0.9  EOSABS 0.3  BASOSABS 0.1   ------------------------------------------------------------------------------------------------------------------  Chemistries   Recent Labs Lab 01/30/15 1435  NA 146*  K 5.2*  CL 107  CO2 19*  GLUCOSE 100*  BUN 76*  CREATININE 12.69*  CALCIUM 8.9  AST 28  ALT 28  ALKPHOS 64  BILITOT 1.5*   ------------------------------------------------------------------------------------------------------------------ CrCl cannot be calculated (Unknown ideal weight.). ------------------------------------------------------------------------------------------------------------------ No results for input(s): TSH, T4TOTAL, T3FREE, THYROIDAB in the last 72 hours.  Invalid input(s): FREET3   Coagulation profile No results for input(s): INR, PROTIME in the last 168 hours. ------------------------------------------------------------------------------------------------------------------- No results for input(s): DDIMER in the last 72  hours. -------------------------------------------------------------------------------------------------------------------  Cardiac Enzymes No results for input(s): CKMB, TROPONINI, MYOGLOBIN in the last 168 hours.  Invalid input(s): CK ------------------------------------------------------------------------------------------------------------------ Invalid input(s): POCBNP   ---------------------------------------------------------------------------------------------------------------  Urinalysis    Component Value Date/Time   COLORURINE YELLOW 01/30/2015 1324   APPEARANCEUR CLOUDY* 01/30/2015 1324   LABSPEC 1.008 01/30/2015 1324   PHURINE 7.5 01/30/2015 1324   GLUCOSEU NEGATIVE 01/30/2015 1324   HGBUR LARGE* 01/30/2015 1324   BILIRUBINUR NEGATIVE 01/30/2015 1324   KETONESUR NEGATIVE 01/30/2015 1324   PROTEINUR 100* 01/30/2015 1324   UROBILINOGEN 0.2 01/30/2015 1324   NITRITE NEGATIVE 01/30/2015 1324   LEUKOCYTESUR SMALL* 01/30/2015 1324    ----------------------------------------------------------------------------------------------------------------  Imaging results:   Ct Abdomen Pelvis Wo Contrast  01/30/2015   CLINICAL DATA:  81 year old male with abdominal distention, renal failure, right flank pain, oliguria. Initial encounter.  EXAM: CT ABDOMEN AND PELVIS WITHOUT CONTRAST  TECHNIQUE: Multidetector CT imaging of the abdomen and pelvis was performed following the standard protocol without IV contrast.  COMPARISON:  CT Abdomen and Pelvis 05/29/2014 and earlier.  FINDINGS: New small layering right pleural effusion. Trace layering left pleural effusion. No pericardial effusion. Septal thickening at both lung bases.  Mild dependent atelectasis.  Stable visualized osseous structures. L4 hemivertebra type appearance re- identified.  Stable fat containing left inguinal hernia. No pelvic free fluid. Foley catheter in the bladder which is decompressed. Decompressed distal colon.   Sigmoid diverticulosis with no active inflammation. Occasional diverticula in the more proximal colon without active inflammation. Fluid in the ascending colon. Negative appendix and terminal ileum. No dilated small bowel. Oral contrast in the stomach and proximal small bowel, has not yet reached the distal small bowel. Mildly distended stomach.  No pneumoperitoneum. No free fluid in the abdomen. Noncontrast liver remarkable for decreased density in keeping with steatosis. Surgically absent gallbladder. Negative non contrast spleen, pancreas and adrenal glands. Aortoiliac calcified atherosclerosis noted. Mild ectasia of the infrarenal aorta is stable.  Negative non contrast right kidney. No right nephrolithiasis, hydronephrosis, or perinephric stranding. Stable left kidney with midpole cyst. No left hydronephrosis or renal calculus.  No hydroureter, but there is mild bilateral periureteral stranding (series 2, images 47-49). This does not continue all the way to the ureterovesical junction. The appearance is nonspecific. There is no lymphadenopathy in the abdomen or pelvis.  IMPRESSION: 1. New small layering pleural effusions and pulmonary septal thickening at the lung bases compatible with a degree of interstitial edema. 2. No obstructive uropathy or urologic calculus. Mild nonspecific bilateral periureteral stranding. Query ascending urinary tract infection. Bladder decompressed by Foley catheter. 3. Otherwise no acute or inflammatory changes in the abdomen or pelvis. Hepatic steatosis. Diverticulosis of the colon.   Electronically Signed   By: Genevie Ann M.D.   On: 01/30/2015 15:43   Dg Chest 2 View  01/30/2015   CLINICAL DATA:  Fatigue.  Shortness of breath.  EXAM: CHEST  2 VIEW  COMPARISON:  11/08/2014.  FINDINGS: Mediastinum and hilar structures are normal. No focal infiltrate. Cardiomegaly with mild pulmonary vascular prominence. Mild interstitial prominence. Small right pleural effusion. No acute bony  abnormality.  IMPRESSION: 1. Cardiomegaly with mild pulmonary vascular prominence and interstitial prominence. Mild congestive heart failure cannot be excluded . 2. Small right pleural effusion .   Electronically Signed   By: Marcello Moores  Register   On: 01/30/2015 11:26   Dg Abd 1 View  01/30/2015   CLINICAL DATA:  Abdominal distention.  EXAM: ABDOMEN - 1 VIEW  COMPARISON:  None.  FINDINGS: Cholecystectomy. Soft tissue structures are unremarkable. Nondilated air-filled loops of small bowel are noted. Colonic gas pattern is unremarkable. No free air. Pelvic calcifications consistent phleboliths. Degenerative changes and scoliosis lumbar spine. Degenerative changes both hips.  IMPRESSION: Multiple air-filled loops of nondilated small bowel. Follow-up abdominal series is suggested to exclude developing small bowel distention.   Electronically Signed   By: Marcello Moores  Register   On: 01/30/2015 11:28    My personal review of EKG: Rhythm NSR, Rate  75  /min, QTc450  , no Acute ST changes    Assessment & Plan  Active Problems:   Acute renal failure   Hypertension   Renal failure    Acute renal failure - Agent presents with elevated creatinine at 12.6, previous history of COPD, no findings to suggest obstructive uropathy has no evidence of hydronephrosis on CT abdomen pelvis, this and talk and there isn't much convincing evidence of PML hypovolemia, since is clinically mildly dehydrated, but not to a degree explain this elevation of his creatinine, intrarenal disease most likely the cause, discussed with nephrology Dr. Moshe Cipro, will obtain basic workup including ANCA, ANA, C3, C4, serum electrophoresis, urine immunoelectrophoresis, anti-GBM, as per nephrology  request he will be admitted to [facility] in case he will need hemodialysis. - Patient received some IV fluids in ED, has increase in his oxygen demand, still no crackers could be healed on physical exam, but will admit to stepdown unit in  case he will need BiPAP for volume overload.  Acute hypoxic respiratory failure -As is most likely related to volume overload as evident on chest x-ray, will avoid any further IV fluids, if needed will give IV Lasix, and started on BiPAP.  Blood pressure is mildly elevated - We'll hold hydrochlorothiazide, continue with Norvasc and Tenormin.   DVT Prophylaxis Heparin -    AM Labs Ordered, also please review Full Orders  Family Communication: Admission, patients condition and plan of care including tests being ordered have been discussed with the patient and wife who indicate understanding and agree with the plan and Code Status.  Code Status FULL  Likely DC to  pending further work up.  Condition GUARDED    Time spent in minutes : 55 minutes   Thaddeus Evitts M.D on 01/30/2015 at 4:30 PM  Between 7am to 7pm - Pager - [phone]  After 7pm go to [email or web address] - password [email or web address]  And look for the night coverage person covering me after hours  [facility] Office  [phone]

## 2015-01-30 NOTE — ED Notes (Signed)
Pt sent from urologist with lab results of high creatinine level  13. Pt co right flank pain. Pt presents  with leg bag. Also reports oliguria.

## 2015-01-30 NOTE — Progress Notes (Signed)
Agent appears to more hypoxic, repeat chest x-ray showing pulmonary edema, will order Lasix IV 120 mg once, BiPAP when necessary. Phillips Climes MD

## 2015-01-31 ENCOUNTER — Inpatient Hospital Stay (HOSPITAL_COMMUNITY): Payer: PPO

## 2015-01-31 DIAGNOSIS — N179 Acute kidney failure, unspecified: Secondary | ICD-10-CM

## 2015-01-31 DIAGNOSIS — N19 Unspecified kidney failure: Secondary | ICD-10-CM

## 2015-01-31 DIAGNOSIS — E875 Hyperkalemia: Secondary | ICD-10-CM

## 2015-01-31 LAB — RENAL FUNCTION PANEL
ANION GAP: 16 — AB (ref 5–15)
Albumin: 3.3 g/dL — ABNORMAL LOW (ref 3.5–5.0)
BUN: 80 mg/dL — AB (ref 6–20)
CHLORIDE: 106 mmol/L (ref 101–111)
CO2: 20 mmol/L — ABNORMAL LOW (ref 22–32)
Calcium: 8.6 mg/dL — ABNORMAL LOW (ref 8.9–10.3)
Creatinine, Ser: 14.38 mg/dL — ABNORMAL HIGH (ref 0.61–1.24)
GFR, EST AFRICAN AMERICAN: 3 mL/min — AB (ref >60)
GFR, EST NON AFRICAN AMERICAN: 3 mL/min — AB (ref >60)
Glucose, Bld: 127 mg/dL — ABNORMAL HIGH (ref 65–99)
PHOSPHORUS: 8.6 mg/dL — AB (ref 2.5–4.6)
POTASSIUM: 5.5 mmol/L — AB (ref 3.5–5.1)
Sodium: 142 mmol/L (ref 135–145)

## 2015-01-31 LAB — PROTEIN ELECTROPHORESIS, SERUM
A/G RATIO SPE: 1.3 (ref 0.7–1.7)
ALBUMIN ELP: 3.3 g/dL (ref 2.9–4.4)
Alpha-1-Globulin: 0.2 g/dL (ref 0.0–0.4)
Alpha-2-Globulin: 0.7 g/dL (ref 0.4–1.0)
Beta Globulin: 0.8 g/dL (ref 0.7–1.3)
GAMMA GLOBULIN: 0.8 g/dL (ref 0.4–1.8)
GLOBULIN, TOTAL: 2.5 g/dL (ref 2.2–3.9)
M-SPIKE, %: 0.2 g/dL — AB
Total Protein ELP: 5.8 g/dL — ABNORMAL LOW (ref 6.0–8.5)

## 2015-01-31 LAB — PROTEIN / CREATININE RATIO, URINE
CREATININE, URINE: 49.61 mg/dL
Protein Creatinine Ratio: 5.5 mg/mg{Cre} — ABNORMAL HIGH (ref 0.00–0.15)
Total Protein, Urine: 273 mg/dL

## 2015-01-31 LAB — CBC
HCT: 30.2 % — ABNORMAL LOW (ref 39.0–52.0)
Hemoglobin: 10.5 g/dL — ABNORMAL LOW (ref 13.0–17.0)
MCH: 33 pg (ref 26.0–34.0)
MCHC: 34.8 g/dL (ref 30.0–36.0)
MCV: 95 fL (ref 78.0–100.0)
PLATELETS: 109 10*3/uL — AB (ref 150–400)
RBC: 3.18 MIL/uL — ABNORMAL LOW (ref 4.22–5.81)
RDW: 13.8 % (ref 11.5–15.5)
WBC: 9.1 10*3/uL (ref 4.0–10.5)

## 2015-01-31 LAB — C4 COMPLEMENT: Complement C4, Body Fluid: 22 mg/dL (ref 14–44)

## 2015-01-31 LAB — ANCA TITERS
C-ANCA: <1:20 {titer}
P-ANCA: <1:20 {titer}

## 2015-01-31 LAB — C3 COMPLEMENT: C3 COMPLEMENT: 104 mg/dL (ref 82–167)

## 2015-01-31 MED ORDER — HEPARIN SODIUM (PORCINE) 5000 UNIT/ML IJ SOLN
5000.0000 [IU] | Freq: Three times a day (TID) | INTRAMUSCULAR | Status: DC
  Administered 2015-01-31 – 2015-02-01 (×2): 5000 [IU] via SUBCUTANEOUS
  Filled 2015-01-31 (×5): qty 1

## 2015-01-31 MED ORDER — CETYLPYRIDINIUM CHLORIDE 0.05 % MT LIQD
7.0000 mL | Freq: Two times a day (BID) | OROMUCOSAL | Status: DC
Start: 2015-01-31 — End: 2015-02-16
  Administered 2015-01-31 – 2015-02-15 (×27): 7 mL via OROMUCOSAL

## 2015-01-31 MED ORDER — DEXTROSE 5 % IV SOLN
160.0000 mg | Freq: Once | INTRAVENOUS | Status: DC
  Filled 2015-01-31: qty 16

## 2015-01-31 NOTE — Progress Notes (Signed)
[facility] TEAM 1 - Stepdown/ICU TEAM Progress Note  MICAEL BARB OPF:[id number] DOB: [birth year] DOA: 01/30/2015 PCP: Wenda Low, MD  Admit HPI / Brief Narrative: 81yo male with history of HTN and renal stones who went to see his PCP c/o fatigue with decreased urine output.  Labs noted creatinine of 12.6, w/ baseline at PCP office in October 2015 of 1.17.  CT abdomen and pelvis did not show evidence of renal stones or hydronephrosis, only mild pre-ureteral stranding.  Patient was seen by Urology Dr.Herrick an had Foley catheter inserted with minimal urine output (total 150 mL).  HPI/Subjective: The patient states he continues to feel "very bad all over."  He is somewhat short of breath but denies chest pain nausea vomiting or abdominal pain.  He denies diarrhea or headache.  Assessment/Plan:  Acute renal failure Baseline creatinine 1.27 May 2014 - idiopathic - poor response to diuretic - Nephrology planning to initiate dialysis and for eventual renal biopsy  Hyperkalemia Due to acute renal failure - for hemodialysis  Acute hypoxic respiratory failure - pulmonary edema Most consistent with pulmonary edema related to acute renal failure - should resolve with initiation of dialysis  Anemia Likely related to kidney failure - workup underway  Hypertension Blood pressure reasonably controlled at present time - will not adjust medical therapy as patient will soon begin dialysis  Prior history of DVT during hospitalization  COPD No evidence of acute exacerbation at present time  Chronic arthritis Denies focal joint pain at this time  Code Status: FULL Family Communication: Spoke at length with patient and wife at bedside Disposition Plan: SDU  Consultants: Nephrology   Procedures: 6/22 insertion L IJ temporary hemodialysis catheter  Antibiotics: None   DVT prophylaxis: SCDs + [place] heparin  Objective: Blood pressure 135/67, pulse 79, temperature 98.2 F (36.8 C),  temperature source Oral, resp. rate 25, height 5\' 6"  (1.676 m), weight 95.8 kg (211 lb 3.2 oz), SpO2 96 %.  Intake/Output Summary (Last 24 hours) at 01/31/15 1119 Last data filed at 01/31/15 3817  Gross per 24 hour  Intake   1243 ml  Output    275 ml  Net    968 ml   Exam: General: Mild tachypnea but no severe distress - alert and conversant Lungs: Fine crackles scattered throughout all fields with no wheeze Cardiovascular: Regular rate and rhythm without gallop or rub normal S1 and S2 Abdomen: Nontender, mildly protuberant, soft, bowel sounds positive, no rebound, no ascites, no appreciable mass Extremities: No significant cyanosis, or clubbing, 1+ edema bilateral lower extremities  Data Reviewed: Basic Metabolic Panel:  Recent Labs Lab 01/30/15 1435 01/30/15 2018 01/31/15 0815  NA 146*  --  142  K 5.2*  --  5.5*  CL 107  --  106  CO2 19*  --  20*  GLUCOSE 100*  --  127*  BUN 76*  --  80*  CREATININE 12.69* 13.09* 14.38*  CALCIUM 8.9  --  8.6*  PHOS  --   --  8.6*    CBC:  Recent Labs Lab 01/30/15 1435 01/30/15 2018 01/31/15 0307  WBC 9.4 10.3 9.1  NEUTROABS 5.8  --   --   HGB 11.1* 11.0* 10.5*  HCT 31.1* 31.5* 30.2*  MCV 94.0 95.5 95.0  PLT 113* 116* 109*    Liver Function Tests:  Recent Labs Lab 01/30/15 1435 01/31/15 0815  AST 28  --   ALT 28  --   ALKPHOS 64  --   BILITOT 1.5*  --  PROT 6.5  --   ALBUMIN 3.9 3.3*    Recent Results (from the past 240 hour(s))  MRSA PCR Screening     Status: None   Collection Time: 01/30/15  7:01 PM  Result Value Ref Range Status   MRSA by PCR NEGATIVE NEGATIVE Final    Comment:        The GeneXpert MRSA Assay (FDA approved for NASAL specimens only), is one component of a comprehensive MRSA colonization surveillance program. It is not intended to diagnose MRSA infection nor to guide or monitor treatment for MRSA infections.      Studies:   Recent x-ray studies have been reviewed in detail by the  Attending Physician  Scheduled Meds:  Scheduled Meds: . amLODipine  5 mg Oral Daily  . antiseptic oral rinse  7 mL Mouth Rinse BID  . atenolol  25 mg Oral Daily  . budesonide-formoterol  2 puff Inhalation BID  . fluticasone  1 spray Each Nare Daily  . furosemide  160 mg Intravenous Once  . sodium chloride  3 mL Intravenous Q12H  . sodium polystyrene  30 g Oral Once    Time spent on care of this patient: 35 mins   MCCLUNG,JEFFREY T , MD   [facility] Office  [phone] Pager - Text Page per Shea Evans as per below:  On-Call/Text Page:      [email or web address]      password [email or web address]  If 7PM-7AM, please contact night-coverage [email or web address] Password [email or web address] 01/31/2015, 11:19 AM   LOS: 1 day

## 2015-01-31 NOTE — Progress Notes (Signed)
PT Cancellation Note  Patient Details Name: Justin Woods MRN: [id number] DOB: [birth year]   Cancelled Treatment:    Reason Eval/Treat Not Completed: Patient not medically ready.  Spoke with RN who indicates pt is not appropriate at this time and indicates plan is for chest x-ray and then to HD.  Will f/u tomorrow.     Avangelina Flight, Thornton Papas 01/31/2015, 11:22 AM

## 2015-01-31 NOTE — Progress Notes (Addendum)
[facility] Rounding Note Subjective:  Not feeling any better Still SOB No appetite Not much of a response to 120 IV lasix - 275 cc UOP  Objective Vital signs in last 24 hours: Filed Vitals:   01/31/15 0200 01/31/15 0400 01/31/15 0600 01/31/15 0810  BP: 148/67 133/63 135/105 138/64  Pulse: 86 80  80  Temp:  98.2 F (36.8 C)  98.2 F (36.8 C)  TempSrc:  Oral  Oral  Resp: 26 24 23 25   Height:      Weight:      SpO2: 93% 86%  95%   Weight change:   Intake/Output Summary (Last 24 hours) at 01/31/15 0835 Last data filed at 01/30/15 2343  Gross per 24 hour  Intake   1240 ml  Output    275 ml  Net    965 ml    Physical Exam:  BP 138/64 mmHg  Pulse 80  Temp(Src) 98.2 F (36.8 C) (Oral)  Resp 25  Ht 5\' 6"  (1.676 m)  Wt 95.8 kg (211 lb 3.2 oz)  BMI 34.10 kg/m2  SpO2 95%  Elderly ill app WM C/o SOB Oriented No rash or skin lesions +JVD Lungs base crackles S1S2 No S3 Abd protuberant, + BS, some RLQ tenderness 1 + edema LE's Pink tinged urine in foley catheter No asterixus   Recent Labs Lab 01/30/15 1435 01/30/15 2018  NA 146*  --   K 5.2*  --   CL 107  --   CO2 19*  --   GLUCOSE 100*  --   BUN 76*  --   CREATININE 12.69* 13.09*  CALCIUM 8.9  --    Renal panel from this AM pending   Recent Labs Lab 01/30/15 1435  AST 28  ALT 28  ALKPHOS 64  BILITOT 1.5*  PROT 6.5  ALBUMIN 3.9     Recent Labs Lab 01/30/15 1435 01/30/15 2018 01/31/15 0307  WBC 9.4 10.3 9.1  NEUTROABS 5.8  --   --   HGB 11.1* 11.0* 10.5*  HCT 31.1* 31.5* 30.2*  MCV 94.0 95.5 95.0  PLT 113* 116* 109*   Results for Justin, Woods (MRN [id number]) as of 01/31/2015 08:32  Ref. Range 01/30/2015 17:11  C3 Complement Latest Ref Range: 82-167 mg/dL 104  Complement C4, Body Fluid Latest Ref Range: 14-44 mg/dL 22   Results for Justin, Woods (MRN [id number]) as of 01/31/2015 08:32  Ref. Range 01/30/2015 13:24  Appearance Latest Ref Range: CLEAR  CLOUDY (A)   Bacteria, UA Latest Ref Range: RARE  FEW (A)  Bilirubin Urine Latest Ref Range: NEGATIVE  NEGATIVE  Color, Urine Latest Ref Range: YELLOW  YELLOW  Glucose Latest Ref Range: NEGATIVE mg/dL NEGATIVE  Hgb urine dipstick Latest Ref Range: NEGATIVE  LARGE (A)  Ketones, ur Latest Ref Range: NEGATIVE mg/dL NEGATIVE  Leukocytes, UA Latest Ref Range: NEGATIVE  SMALL (A)  Nitrite Latest Ref Range: NEGATIVE  NEGATIVE  pH Latest Ref Range: 5.0-8.0  7.5  Protein Latest Ref Range: NEGATIVE mg/dL 100 (A)  RBC / HPF Latest Ref Range: <3 RBC/hpf 21-50  Specific Gravity, Urine Latest Ref Range: 1.005-1.030  1.008  Squamous Epithelial / LPF Latest Ref Range: RARE  RARE  Urobilinogen, UA Latest Ref Range: 0.0-1.0 mg/dL 0.2  WBC, UA Latest Ref Range: <3 WBC/hpf 3-6   SPEP, UPEP, ANA, ANCA panel, Anti GBM - all pending  Studies/Results: Ct Abdomen Pelvis Wo Contrast  01/30/2015   CLINICAL DATA:  81 year old male with abdominal  distention, renal failure, right flank pain, oliguria. Initial encounter.  EXAM: CT ABDOMEN AND PELVIS WITHOUT CONTRAST  TECHNIQUE: Multidetector CT imaging of the abdomen and pelvis was performed following the standard protocol without IV contrast.  COMPARISON:  CT Abdomen and Pelvis 05/29/2014 and earlier.  FINDINGS: New small layering right pleural effusion. Trace layering left pleural effusion. No pericardial effusion. Septal thickening at both lung bases. Mild dependent atelectasis.  Stable visualized osseous structures. L4 hemivertebra type appearance re- identified.  Stable fat containing left inguinal hernia. No pelvic free fluid. Foley catheter in the bladder which is decompressed. Decompressed distal colon.  Sigmoid diverticulosis with no active inflammation. Occasional diverticula in the more proximal colon without active inflammation. Fluid in the ascending colon. Negative appendix and terminal ileum. No dilated small bowel. Oral contrast in the stomach and proximal small bowel,  has not yet reached the distal small bowel. Mildly distended stomach.  No pneumoperitoneum. No free fluid in the abdomen. Noncontrast liver remarkable for decreased density in keeping with steatosis. Surgically absent gallbladder. Negative non contrast spleen, pancreas and adrenal glands. Aortoiliac calcified atherosclerosis noted. Mild ectasia of the infrarenal aorta is stable.  Negative non contrast right kidney. No right nephrolithiasis, hydronephrosis, or perinephric stranding. Stable left kidney with midpole cyst. No left hydronephrosis or renal calculus.  No hydroureter, but there is mild bilateral periureteral stranding (series 2, images 47-49). This does not continue all the way to the ureterovesical junction. The appearance is nonspecific. There is no lymphadenopathy in the abdomen or pelvis.  IMPRESSION: 1. New small layering pleural effusions and pulmonary septal thickening at the lung bases compatible with a degree of interstitial edema. 2. No obstructive uropathy or urologic calculus. Mild nonspecific bilateral periureteral stranding. Query ascending urinary tract infection. Bladder decompressed by Foley catheter. 3. Otherwise no acute or inflammatory changes in the abdomen or pelvis. Hepatic steatosis. Diverticulosis of the colon.   Electronically Signed   By: Genevie Ann M.D.   On: 01/30/2015 15:43   Dg Chest 2 View  01/30/2015   CLINICAL DATA:  Fatigue.  Shortness of breath.  EXAM: CHEST  2 VIEW  COMPARISON:  11/08/2014.  FINDINGS: Mediastinum and hilar structures are normal. No focal infiltrate. Cardiomegaly with mild pulmonary vascular prominence. Mild interstitial prominence. Small right pleural effusion. No acute bony abnormality.  IMPRESSION: 1. Cardiomegaly with mild pulmonary vascular prominence and interstitial prominence. Mild congestive heart failure cannot be excluded . 2. Small right pleural effusion .   Electronically Signed   By: Marcello Moores  Register   On: 01/30/2015 11:26   Dg Abd 1  View  01/30/2015   CLINICAL DATA:  Abdominal distention.  EXAM: ABDOMEN - 1 VIEW  COMPARISON:  None.  FINDINGS: Cholecystectomy. Soft tissue structures are unremarkable. Nondilated air-filled loops of small bowel are noted. Colonic gas pattern is unremarkable. No free air. Pelvic calcifications consistent phleboliths. Degenerative changes and scoliosis lumbar spine. Degenerative changes both hips.  IMPRESSION: Multiple air-filled loops of nondilated small bowel. Follow-up abdominal series is suggested to exclude developing small bowel distention.   Electronically Signed   By: Marcello Moores  Register   On: 01/30/2015 11:28   Dg Chest Port 1 View  01/30/2015   CLINICAL DATA:  Dyspnea and high creatinine level  EXAM: PORTABLE CHEST - 1 VIEW  COMPARISON:  01/30/2015  FINDINGS: Pulmonary edema has developed since earlier today. No cardiomegaly when accounting for lower mediastinal fat. Negative aortic and hilar contours. Trace right pleural effusion.  IMPRESSION: Pulmonary edema, new from 11:30 a.m.  earlier today.   Electronically Signed   By: Monte Fantasia M.D.   On: 01/30/2015 17:14   Medications:   . amLODipine  5 mg Oral Daily  . antiseptic oral rinse  7 mL Mouth Rinse BID  . atenolol  25 mg Oral Daily  . budesonide-formoterol  2 puff Inhalation BID  . fluticasone  1 spray Each Nare Daily  . heparin  5,000 Units Subcutaneous 3 times per day  . sodium chloride  3 mL Intravenous Q12H  . sodium polystyrene  30 g Oral Once   Background Active 81 yo WM with fairly benign PMH of HTN, COPD, arthritis. Baseline creatinine of 1.27 May 2014.  Presented with several days of worsening fatigue, decreased UOP, dyspnea who presents with AKI, creatinine of 12-13, UA showing proteinuria and hematuria, and we were asked to evaluate. Only relevant drug exposures were daily ibuprofen and PPI  1. AKI - Etiology not clear. Could be NSAID related but hematuria not a prominent finding with NSAID related lesions such as  ATIN w/minmal change. PPI has been stopped (but PPI associated lesion usually acute interstitial nephritis - sediment again is a little atypical) Active urine sediment suggestive of acute glomerular process. Cannot r/o pulm renal syndrome. Multiple pending studies.  Poor diuretic response. Multiple pending studies. In my opinion will require dialysis and a renal biopsy. Will make arrangements to get a catheter in, dialysis for volume and azotemia, renal bx when he can comfortably lie flat. Redose lasix at 160 pending catheter placement. F/U AM creatinine (labs not back yet) 2. Anemia - Fe panel 3. COPD 4. Arthritis - stopped NSAIDS on admission 5. HTN - meds   Jamal Maes, MD [facility] [phone] pager 01/31/2015, 8:35 AM

## 2015-01-31 NOTE — Progress Notes (Signed)
Flushed red and blue ports of dialysis catheter with 10cc NS and 1.4cc heparin (1,000 units /cc) each. Flushed pigtail with 10cc  Saline.  Good blood return/ Capped and clamped and alert sticker (high dose heaprin) applied.

## 2015-01-31 NOTE — Progress Notes (Signed)
Utilization review complete. Iya Hamed RN CCM Case Mgmt phone [phone] 

## 2015-01-31 NOTE — Procedures (Signed)
I have personally attended this patient's dialysis session.   HD #1 for AKI Creatinine up to 14, K 5.5 2K 2.25 Ca bath 2.5 hour treatment today 1-2 liters off - BP is tolerating so far with 1 liter off. Plan another treatment tomorrow.  Pt will need renal biopsy - try to set that up for Friday. Hopeful as we pull additional volume off that he will be able to lie flat.  Jamal Maes, MD [facility] [phone] Pager 01/31/2015, 3:28 PM

## 2015-01-31 NOTE — Progress Notes (Signed)
Assisted Richardson Landry Minor, NP for hemodialysis access placement.  Patient tolerated well.  Unable to infuse Lasix due to no IV access.  Dr. Lorrene Reid notified.

## 2015-01-31 NOTE — Procedures (Signed)
Hemodialysis Insertion Procedure Note Justin Woods [id number] 1931/06/24  Procedure: Insertion of Hemodialysis Catheter Type: 3 port  Indications: Hemodialysis   Procedure Details Consent: Risks of procedure as well as the alternatives and risks of each were explained to the (patient/caregiver).  Consent for procedure obtained. Time Out: Verified patient identification, verified procedure, site/side was marked, verified correct patient position, special equipment/implants available, medications/allergies/relevent history reviewed, required imaging and test results available.  Performed  Maximum sterile technique was used including antiseptics, cap, gloves, gown, hand hygiene, mask and sheet. Skin prep: Chlorhexidine; local anesthetic administered A antimicrobial bonded/coated triple lumen catheter was placed in the left internal jugular vein using the Seldinger technique. Ultrasound guidance used.Yes.   Catheter placed to 20 cm. Blood aspirated via all 3 ports and then flushed x 3. Line sutured x 2 and dressing applied.  Evaluation Blood flow good Complications: No apparent complications Patient did tolerate procedure well. Chest X-ray ordered to verify placement.  CXR: pending.  Richardson Landry Minor ACNP Maryanna Shape PCCM Pager [phone] till 3 pm If no answer page [phone] 01/31/2015, 10:17 AM  U/S used in placement.  I was present and supervised the entire procedure.  Rush Farmer, M.D. [facility] Pulmonary/Critical Care Medicine. Pager: [phone]. After hours pager: [phone].

## 2015-02-01 ENCOUNTER — Encounter (HOSPITAL_COMMUNITY): Payer: Self-pay | Admitting: Radiology

## 2015-02-01 DIAGNOSIS — I10 Essential (primary) hypertension: Secondary | ICD-10-CM | POA: Diagnosis present

## 2015-02-01 DIAGNOSIS — J438 Other emphysema: Secondary | ICD-10-CM | POA: Diagnosis present

## 2015-02-01 DIAGNOSIS — J810 Acute pulmonary edema: Secondary | ICD-10-CM | POA: Diagnosis present

## 2015-02-01 DIAGNOSIS — E875 Hyperkalemia: Secondary | ICD-10-CM | POA: Diagnosis present

## 2015-02-01 DIAGNOSIS — R0600 Dyspnea, unspecified: Secondary | ICD-10-CM

## 2015-02-01 DIAGNOSIS — Z992 Dependence on renal dialysis: Secondary | ICD-10-CM

## 2015-02-01 DIAGNOSIS — J9601 Acute respiratory failure with hypoxia: Secondary | ICD-10-CM | POA: Diagnosis present

## 2015-02-01 DIAGNOSIS — N186 End stage renal disease: Secondary | ICD-10-CM | POA: Diagnosis present

## 2015-02-01 LAB — UIFE/LIGHT CHAINS/TP QN, 24-HR UR
% BETA, Urine: 9 %
ALPHA 1 URINE: 1.5 %
Albumin, U: 18.8 %
Alpha 2, Urine: 3.7 %
FREE KAPPA/LAMBDA RATIO: 1472.73 — AB (ref 2.04–10.37)
Free Lambda Lt Chains,Ur: 11 mg/L — ABNORMAL HIGH (ref 0.24–6.66)
GAMMA GLOBULIN URINE: 67 %
M-SPIKE %, Urine: 64.8 % — ABNORMAL HIGH
Total Protein, Urine: 253.3 mg/dL

## 2015-02-01 LAB — RENAL FUNCTION PANEL
Albumin: 2.9 g/dL — ABNORMAL LOW (ref 3.5–5.0)
Anion gap: 13 (ref 5–15)
BUN: 57 mg/dL — AB (ref 6–20)
CALCIUM: 8.5 mg/dL — AB (ref 8.9–10.3)
CHLORIDE: 101 mmol/L (ref 101–111)
CO2: 26 mmol/L (ref 22–32)
Creatinine, Ser: 11.73 mg/dL — ABNORMAL HIGH (ref 0.61–1.24)
GFR calc Af Amer: 4 mL/min — ABNORMAL LOW (ref >60)
GFR calc non Af Amer: 3 mL/min — ABNORMAL LOW (ref >60)
Glucose, Bld: 112 mg/dL — ABNORMAL HIGH (ref 65–99)
Phosphorus: 8 mg/dL — ABNORMAL HIGH (ref 2.5–4.6)
Potassium: 4.9 mmol/L (ref 3.5–5.1)
SODIUM: 140 mmol/L (ref 135–145)

## 2015-02-01 LAB — CBC
HCT: 29.5 % — ABNORMAL LOW (ref 39.0–52.0)
Hemoglobin: 10.2 g/dL — ABNORMAL LOW (ref 13.0–17.0)
MCH: 32.6 pg (ref 26.0–34.0)
MCHC: 34.6 g/dL (ref 30.0–36.0)
MCV: 94.2 fL (ref 78.0–100.0)
Platelets: 107 10*3/uL — ABNORMAL LOW (ref 150–400)
RBC: 3.13 MIL/uL — ABNORMAL LOW (ref 4.22–5.81)
RDW: 13.7 % (ref 11.5–15.5)
WBC: 8.7 10*3/uL (ref 4.0–10.5)

## 2015-02-01 LAB — IRON AND TIBC
Iron: 102 ug/dL (ref 45–182)
Saturation Ratios: 40 % — ABNORMAL HIGH (ref 17.9–39.5)
TIBC: 255 ug/dL (ref 250–450)
UIBC: 153 ug/dL

## 2015-02-01 LAB — HEPATITIS B CORE ANTIBODY, IGM: Hep B C IgM: NEGATIVE

## 2015-02-01 LAB — HEPATITIS B SURFACE ANTIGEN: Hepatitis B Surface Ag: NEGATIVE

## 2015-02-01 LAB — HEPATITIS B SURFACE ANTIBODY, QUANTITATIVE: Hepatitis B-Post: <3.1 m[IU]/mL — ABNORMAL LOW (ref >9.9)

## 2015-02-01 LAB — FERRITIN: Ferritin: 386 ng/mL — ABNORMAL HIGH (ref 24–336)

## 2015-02-01 LAB — GLOMERULAR BASEMENT MEMBRANE ANTIBODIES: GBM AB: 3 U (ref 0–20)

## 2015-02-01 MED ORDER — METHYLPREDNISOLONE SODIUM SUCC 1000 MG IJ SOLR
500.0000 mg | INTRAMUSCULAR | Status: AC
  Administered 2015-02-01 – 2015-02-03 (×3): 500 mg via INTRAVENOUS
  Filled 2015-02-01 (×4): qty 4

## 2015-02-01 MED ORDER — ATENOLOL 25 MG PO TABS
25.0000 mg | ORAL_TABLET | Freq: Every day | ORAL | Status: DC
  Administered 2015-02-01 – 2015-02-15 (×15): 25 mg via ORAL
  Filled 2015-02-01 (×17): qty 1

## 2015-02-01 NOTE — Progress Notes (Signed)
SATURATION QUALIFICATIONS: (This note is used to comply with regulatory documentation for home oxygen)  Patient Saturations on Room Air at Rest = 94%  Patient Saturations on Room Air while Ambulating = 84-86%  Patient Saturations on 2-3 Liters of oxygen while Ambulating = 88-92%  Please briefly explain why patient needs home oxygen:Pt needed 3 L O2 to keep sats >90% with activity.  May need home O2. Thanks. [place] [phone] (pager)

## 2015-02-01 NOTE — Progress Notes (Addendum)
[facility] Rounding Note Subjective:  Had temp left  IJ HD placed yesterday and rec'd 1st HD Had some LE cramping, otherwise pretty well tolereated Some soft BP's this AM To have another treatment today Teeing up for renal biopsy tomorrow 575 ml UOP yesterday, minimal amount in foley now Up in the chair eating breakfast Says breathing better - but still drops sats when O2 off  Objective Vital signs in last 24 hours: Filed Vitals:   02/01/15 0400 02/01/15 0600 02/01/15 0754 02/01/15 0755  BP: 95/44 105/41 127/54   Pulse: 67 66 76   Temp: 98 F (36.7 C)  97.6 F (36.4 C)   TempSrc: Oral  Oral   Resp: 20 19 20    Height:      Weight:      SpO2: 98% 97% 90% 91%   Weight change: -2.8 kg (-6 lb 2.8 oz)  Intake/Output Summary (Last 24 hours) at 02/01/15 0800 Last data filed at 02/01/15 0600  Gross per 24 hour  Intake      3 ml  Output   1942 ml  Net  -1939 ml    Physical Exam:  BP 127/54 mmHg  Pulse 76  Temp(Src) 97.6 F (36.4 C) (Oral)  Resp 20  Ht 5\' 6"  (1.676 m)  Wt 91.1 kg (200 lb 13.4 oz)  BMI 32.43 kg/m2  SpO2 91%  Elderly  WM Oriented, awake and alert and eating bfast No rash or skin lesions +Min JVD Left IJ tem HD cath (6/22) Lungs base crackles, anteriorly clear S1S2 No S3 Abd protuberant, + BS, no tenderness today Trace edema LE's Minimal light colored urine in foley catheter No asterixus   Recent Labs Lab 01/30/15 1435 01/30/15 2018 01/31/15 0815 02/01/15 0211  NA 146*  --  142 140  K 5.2*  --  5.5* 4.9  CL 107  --  106 101  CO2 19*  --  20* 26  GLUCOSE 100*  --  127* 112*  BUN 76*  --  80* 57*  CREATININE 12.69* 13.09* 14.38* 11.73*  CALCIUM 8.9  --  8.6* 8.5*  PHOS  --   --  8.6* 8.0*      Recent Labs Lab 01/30/15 1435 01/31/15 0815 02/01/15 0211  AST 28  --   --   ALT 28  --   --   ALKPHOS 64  --   --   BILITOT 1.5*  --   --   PROT 6.5  --   --   ALBUMIN 3.9 3.3* 2.9*     Recent Labs Lab 01/30/15 1435  01/30/15 2018 01/31/15 0307 02/01/15 0211  WBC 9.4 10.3 9.1 8.7  NEUTROABS 5.8  --   --   --   HGB 11.1* 11.0* 10.5* 10.2*  HCT 31.1* 31.5* 30.2* 29.5*  MCV 94.0 95.5 95.0 94.2  PLT 113* 116* 109* 107*   Serologies and Urine Studies  Results for Justin Woods, Justin Woods (MRN [id number]) as of 01/31/2015 08:32  Ref. Range 01/30/2015 13:24  Appearance Latest Ref Range: CLEAR  CLOUDY (A)  Bacteria, UA Latest Ref Range: RARE  FEW (A)  Bilirubin Urine Latest Ref Range: NEGATIVE  NEGATIVE  Color, Urine Latest Ref Range: YELLOW  YELLOW  Glucose Latest Ref Range: NEGATIVE mg/dL NEGATIVE  Hgb urine dipstick Latest Ref Range: NEGATIVE  LARGE (A)  Ketones, ur Latest Ref Range: NEGATIVE mg/dL NEGATIVE  Leukocytes, UA Latest Ref Range: NEGATIVE  SMALL (A)  Nitrite Latest Ref Range: NEGATIVE  NEGATIVE  pH Latest Ref Range: 5.0-8.0  7.5  Protein Latest Ref Range: NEGATIVE mg/dL 100 (A)  RBC / HPF Latest Ref Range: <3 RBC/hpf 21-50  Specific Gravity, Urine Latest Ref Range: 1.005-1.030  1.008  Squamous Epithelial / LPF Latest Ref Range: RARE  RARE  Urobilinogen, UA Latest Ref Range: 0.0-1.0 mg/dL 0.2  WBC, UA Latest Ref Range: <3 WBC/hpf 3-6   ANCA negative C3, C4 normal (104, 22) Hep B negative SPEP faint band in gamma region suspicious for monoclonal Ig UPC 5.5  grams proteinura ANA, anti-GBM, Urine IFE, serum free light chains - all pending  Studies/Results: Ct Abdomen Pelvis Wo Contrast  01/30/2015   CLINICAL DATA:  81 year old male with abdominal distention, renal failure, right flank pain, oliguria. Initial encounter.  EXAM: CT ABDOMEN AND PELVIS WITHOUT CONTRAST  TECHNIQUE: Multidetector CT imaging of the abdomen and pelvis was performed following the standard protocol without IV contrast.  COMPARISON:  CT Abdomen and Pelvis 05/29/2014 and earlier.  FINDINGS: New small layering right pleural effusion. Trace layering left pleural effusion. No pericardial effusion. Septal thickening at both lung  bases. Mild dependent atelectasis.  Stable visualized osseous structures. L4 hemivertebra type appearance re- identified.  Stable fat containing left inguinal hernia. No pelvic free fluid. Foley catheter in the bladder which is decompressed. Decompressed distal colon.  Sigmoid diverticulosis with no active inflammation. Occasional diverticula in the more proximal colon without active inflammation. Fluid in the ascending colon. Negative appendix and terminal ileum. No dilated small bowel. Oral contrast in the stomach and proximal small bowel, has not yet reached the distal small bowel. Mildly distended stomach.  No pneumoperitoneum. No free fluid in the abdomen. Noncontrast liver remarkable for decreased density in keeping with steatosis. Surgically absent gallbladder. Negative non contrast spleen, pancreas and adrenal glands. Aortoiliac calcified atherosclerosis noted. Mild ectasia of the infrarenal aorta is stable.  Negative non contrast right kidney. No right nephrolithiasis, hydronephrosis, or perinephric stranding. Stable left kidney with midpole cyst. No left hydronephrosis or renal calculus.  No hydroureter, but there is mild bilateral periureteral stranding (series 2, images 47-49). This does not continue all the way to the ureterovesical junction. The appearance is nonspecific. There is no lymphadenopathy in the abdomen or pelvis.  IMPRESSION: 1. New small layering pleural effusions and pulmonary septal thickening at the lung bases compatible with a degree of interstitial edema. 2. No obstructive uropathy or urologic calculus. Mild nonspecific bilateral periureteral stranding. Query ascending urinary tract infection. Bladder decompressed by Foley catheter. 3. Otherwise no acute or inflammatory changes in the abdomen or pelvis. Hepatic steatosis. Diverticulosis of the colon.   Electronically Signed   By: Genevie Ann M.D.   On: 01/30/2015 15:43   Dg Chest 2 View  01/30/2015   CLINICAL DATA:  Fatigue.  Shortness  of breath.  EXAM: CHEST  2 VIEW  COMPARISON:  11/08/2014.  FINDINGS: Mediastinum and hilar structures are normal. No focal infiltrate. Cardiomegaly with mild pulmonary vascular prominence. Mild interstitial prominence. Small right pleural effusion. No acute bony abnormality.  IMPRESSION: 1. Cardiomegaly with mild pulmonary vascular prominence and interstitial prominence. Mild congestive heart failure cannot be excluded . 2. Small right pleural effusion .   Electronically Signed   By: Marcello Moores  Register   On: 01/30/2015 11:26   Dg Abd 1 View  01/30/2015   CLINICAL DATA:  Abdominal distention.  EXAM: ABDOMEN - 1 VIEW  COMPARISON:  None.  FINDINGS: Cholecystectomy. Soft tissue structures are unremarkable. Nondilated air-filled loops of small bowel are noted.  Colonic gas pattern is unremarkable. No free air. Pelvic calcifications consistent phleboliths. Degenerative changes and scoliosis lumbar spine. Degenerative changes both hips.  IMPRESSION: Multiple air-filled loops of nondilated small bowel. Follow-up abdominal series is suggested to exclude developing small bowel distention.   Electronically Signed   By: Marcello Moores  Register   On: 01/30/2015 11:28   Dg Chest Port 1 View  01/31/2015   CLINICAL DATA:  Initial encounter for hemodialysis catheter placement.  EXAM: PORTABLE CHEST - 1 VIEW  COMPARISON:  01/30/2015.  FINDINGS: 1142 hrs. Left IJ dialysis catheter tip overlies the left hilum. The tip projects slightly more lateral than typical seen which is probably related to rightward patient rotation. There is pulmonary vascular congestion without overt pulmonary edema. No focal airspace consolidation or pleural effusion. Cardiopericardial silhouette is at upper limits of normal for size. Telemetry leads overlie the chest. No evidence for pneumothorax.  IMPRESSION: 1. No evidence for pneumothorax status post central line placement.   Electronically Signed   By: Misty Stanley M.D.   On: 01/31/2015 12:02   Dg Chest  Port 1 View  01/30/2015   CLINICAL DATA:  Dyspnea and high creatinine level  EXAM: PORTABLE CHEST - 1 VIEW  COMPARISON:  01/30/2015  FINDINGS: Pulmonary edema has developed since earlier today. No cardiomegaly when accounting for lower mediastinal fat. Negative aortic and hilar contours. Trace right pleural effusion.  IMPRESSION: Pulmonary edema, new from 11:30 a.m. earlier today.   Electronically Signed   By: Monte Fantasia M.D.   On: 01/30/2015 17:14   Medications:   . amLODipine  5 mg Oral Daily  . antiseptic oral rinse  7 mL Mouth Rinse BID  . atenolol  25 mg Oral Daily  . budesonide-formoterol  2 puff Inhalation BID  . fluticasone  1 spray Each Nare Daily  . heparin subcutaneous  5,000 Units Subcutaneous 3 times per day  . sodium chloride  3 mL Intravenous Q12H   Background Active 81 yo WM with fairly benign PMH of HTN, COPD, arthritis. Baseline creatinine of 1.27 May 2014.  Presented with several days of worsening fatigue, decreased UOP, dyspnea who presents with AKI, creatinine of 12-13, UA showing proteinuria and hematuria, and we were asked to evaluate. Only relevant drug exposures were daily ibuprofen and PPI  1. AKI - Oliguric. Etiology not clear. Could be NSAID related but hematuria not usually a prominent finding with NSAID related lesions such as ATIN w/minmal change. PPI has been stopped (but PPI associated lesion usually acute interstitial nephritis - sediment again is a little atypical)  Active urine sediment suggestive of acute glomerular process. No clues from lab results so far. Plan dialysis again today, renal biopsy by IR tomorrow. Will need to discontinue SQ heparin. Given severity of his renal failure, feel should go ahead and treat empirically with pulse solumedrol. Will not affect the renal biopsy findings. 2. Anemia - Fe panel ordered 3. COPD 4. Arthritis - stopped NSAIDS on admission 5. HTN - BP soft. Stop amlodipine. Change atenolol to hs   Jamal Maes,  MD [facility] [phone] pager 02/01/2015, 8:00 AM

## 2015-02-01 NOTE — Progress Notes (Signed)
Pateint has been complaining of muscle cramps to BUE and BLE.  Patient stated that he usually takes quinine.  Informed patient that the only PRN med is Acetaminophen.  Pt and wife understood this information.

## 2015-02-01 NOTE — H&P (Signed)
Chief Complaint: Chief Complaint  Patient presents with  . high creatinine    Referring Physician(s): Nephrology  History of Present Illness: Justin Woods is a 81 y.o. male who presented to his PCP with c/o fatigue, LE swelling, SOB, and decreased urine output for approximately 1 week, he was sent to the urologist who then recommended he go to the ED with a Cr of 12.6 Labs reveal acute kidney injury and UA with proteinuria and hematuria. He has started dialysis yesterday and states his LE swelling and sob have improved. Nephrology has seen the patient and requested IR to perform renal biopsy. He denies any chest pain or palpitations. He denies any active signs of bleeding or excessive bruising. The patient denies any history of sleep apnea or chronic oxygen use. He has previously tolerated sedation without complications during a colonoscopy.   Past Medical History  Diagnosis Date  . Hypertension   . COPD (chronic obstructive pulmonary disease)   . Asthma   . Chronic kidney disease   . History of kidney stones   . History of hiatal hernia   . Headache     HISTORY OF CLUSTER HEADACHES  . Arthritis   . Complication of anesthesia     TROUBLE WAKING UP  . H/O colonoscopy with polypectomy   . Allergic rhinitis   . DVT (deep venous thrombosis)     RIGHT CALF AFTER A LITHOTRIPSY  . BPH (benign prostatic hypertrophy)   . Osteoarthritis     Past Surgical History  Procedure Laterality Date  . Lithotripsy    . Back surgery    . Arthroscopy right knee    . Cholecystectomy    . 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  Medications: Prior to Admission medications   Medication Sig Start Date End Date Taking? Authorizing Provider  amLODipine (NORVASC) 5 MG tablet Take 5 mg by mouth daily.   Yes Historical Provider, MD  atenolol (TENORMIN) 50 MG tablet Take 25 mg by mouth daily.   Yes Historical Provider, MD    budesonide-formoterol (SYMBICORT) 80-4.5 MCG/ACT inhaler Inhale 2 puffs into the lungs 2 (two) times daily.   Yes Historical Provider, MD  Cholecalciferol (VITAMIN D PO) Take 1 tablet by mouth daily.   Yes Historical Provider, MD  fluticasone (FLONASE) 50 MCG/ACT nasal spray Place 1 spray into both nostrils daily.   Yes Historical Provider, MD  hydrochlorothiazide (HYDRODIURIL) 25 MG tablet Take 12.5 mg by mouth daily.   Yes Historical Provider, MD  omeprazole (PRILOSEC) 40 MG capsule Take 40 mg by mouth daily.   Yes Historical Provider, MD     Family History  Problem Relation Age of Onset  . Hypertension Mother   . Hypertension Father     History   Social History  . Marital Status: Married    Spouse Name: N/A  . Number of Children: N/A  . Years of Education: N/A   Social History Main Topics  . Smoking status: Former Smoker -- 2.00 packs/day    Types: Cigarettes  . Smokeless tobacco: Never Used  . Alcohol Use: No  . Drug Use: No  . Sexual Activity: No   Other Topics Concern  . None   Social History Narrative   Review of Systems: A 12 point ROS discussed and pertinent positives are indicated in the HPI above.  All other systems are negative.  Review of Systems  Vital Signs: BP  127/54 mmHg  Pulse 76  Temp(Src) 97.6 F (36.4 C) (Oral)  Resp 20  Ht 5\' 6"  (1.676 m)  Wt 200 lb 13.4 oz (91.1 kg)  BMI 32.43 kg/m2  SpO2 91%  Physical Exam  Constitutional: He is oriented to person, place, and time. No distress.  HENT:  Head: Normocephalic and atraumatic.  Left IJ temp cath intact  Neck: No tracheal deviation present.  Cardiovascular: Normal rate and regular rhythm.  Exam reveals no gallop and no friction rub.   No murmur heard. Pulmonary/Chest: Effort normal and breath sounds normal. No respiratory distress. He has no wheezes. He has no rales.  Abdominal: Soft. Bowel sounds are normal. There is no tenderness.  Musculoskeletal: He exhibits no edema.  Neurological:  He is alert and oriented to person, place, and time.  Skin: Skin is warm and dry. He is not diaphoretic.  Psychiatric: He has a normal mood and affect. His behavior is normal. Thought content normal.  Foley intact clear light yellow urine  Mallampati Score:  MD Evaluation Airway: WNL Heart: WNL Abdomen: WNL Chest/ Lungs: WNL ASA  Classification: 3 Mallampati/Airway Score: Two  Imaging: Ct Abdomen Pelvis Wo Contrast  01/30/2015   CLINICAL DATA:  81 year old male with abdominal distention, renal failure, right flank pain, oliguria. Initial encounter.  EXAM: CT ABDOMEN AND PELVIS WITHOUT CONTRAST  TECHNIQUE: Multidetector CT imaging of the abdomen and pelvis was performed following the standard protocol without IV contrast.  COMPARISON:  CT Abdomen and Pelvis 05/29/2014 and earlier.  FINDINGS: New small layering right pleural effusion. Trace layering left pleural effusion. No pericardial effusion. Septal thickening at both lung bases. Mild dependent atelectasis.  Stable visualized osseous structures. L4 hemivertebra type appearance re- identified.  Stable fat containing left inguinal hernia. No pelvic free fluid. Foley catheter in the bladder which is decompressed. Decompressed distal colon.  Sigmoid diverticulosis with no active inflammation. Occasional diverticula in the more proximal colon without active inflammation. Fluid in the ascending colon. Negative appendix and terminal ileum. No dilated small bowel. Oral contrast in the stomach and proximal small bowel, has not yet reached the distal small bowel. Mildly distended stomach.  No pneumoperitoneum. No free fluid in the abdomen. Noncontrast liver remarkable for decreased density in keeping with steatosis. Surgically absent gallbladder. Negative non contrast spleen, pancreas and adrenal glands. Aortoiliac calcified atherosclerosis noted. Mild ectasia of the infrarenal aorta is stable.  Negative non contrast right kidney. No right nephrolithiasis,  hydronephrosis, or perinephric stranding. Stable left kidney with midpole cyst. No left hydronephrosis or renal calculus.  No hydroureter, but there is mild bilateral periureteral stranding (series 2, images 47-49). This does not continue all the way to the ureterovesical junction. The appearance is nonspecific. There is no lymphadenopathy in the abdomen or pelvis.  IMPRESSION: 1. New small layering pleural effusions and pulmonary septal thickening at the lung bases compatible with a degree of interstitial edema. 2. No obstructive uropathy or urologic calculus. Mild nonspecific bilateral periureteral stranding. Query ascending urinary tract infection. Bladder decompressed by Foley catheter. 3. Otherwise no acute or inflammatory changes in the abdomen or pelvis. Hepatic steatosis. Diverticulosis of the colon.   Electronically Signed   By: Genevie Ann M.D.   On: 01/30/2015 15:43   Dg Chest 2 View  01/30/2015   CLINICAL DATA:  Fatigue.  Shortness of breath.  EXAM: CHEST  2 VIEW  COMPARISON:  11/08/2014.  FINDINGS: Mediastinum and hilar structures are normal. No focal infiltrate. Cardiomegaly with mild pulmonary vascular prominence. Mild interstitial  prominence. Small right pleural effusion. No acute bony abnormality.  IMPRESSION: 1. Cardiomegaly with mild pulmonary vascular prominence and interstitial prominence. Mild congestive heart failure cannot be excluded . 2. Small right pleural effusion .   Electronically Signed   By: Marcello Moores  Register   On: 01/30/2015 11:26   Dg Abd 1 View  01/30/2015   CLINICAL DATA:  Abdominal distention.  EXAM: ABDOMEN - 1 VIEW  COMPARISON:  None.  FINDINGS: Cholecystectomy. Soft tissue structures are unremarkable. Nondilated air-filled loops of small bowel are noted. Colonic gas pattern is unremarkable. No free air. Pelvic calcifications consistent phleboliths. Degenerative changes and scoliosis lumbar spine. Degenerative changes both hips.  IMPRESSION: Multiple air-filled loops of  nondilated small bowel. Follow-up abdominal series is suggested to exclude developing small bowel distention.   Electronically Signed   By: Marcello Moores  Register   On: 01/30/2015 11:28   Dg Chest Port 1 View  01/31/2015   CLINICAL DATA:  Initial encounter for hemodialysis catheter placement.  EXAM: PORTABLE CHEST - 1 VIEW  COMPARISON:  01/30/2015.  FINDINGS: 1142 hrs. Left IJ dialysis catheter tip overlies the left hilum. The tip projects slightly more lateral than typical seen which is probably related to rightward patient rotation. There is pulmonary vascular congestion without overt pulmonary edema. No focal airspace consolidation or pleural effusion. Cardiopericardial silhouette is at upper limits of normal for size. Telemetry leads overlie the chest. No evidence for pneumothorax.  IMPRESSION: 1. No evidence for pneumothorax status post central line placement.   Electronically Signed   By: Misty Stanley M.D.   On: 01/31/2015 12:02   Dg Chest Port 1 View  01/30/2015   CLINICAL DATA:  Dyspnea and high creatinine level  EXAM: PORTABLE CHEST - 1 VIEW  COMPARISON:  01/30/2015  FINDINGS: Pulmonary edema has developed since earlier today. No cardiomegaly when accounting for lower mediastinal fat. Negative aortic and hilar contours. Trace right pleural effusion.  IMPRESSION: Pulmonary edema, new from 11:30 a.m. earlier today.   Electronically Signed   By: Monte Fantasia M.D.   On: 01/30/2015 17:14    Labs:  CBC:  Recent Labs  01/30/15 1435 01/30/15 2018 01/31/15 0307 02/01/15 0211  WBC 9.4 10.3 9.1 8.7  HGB 11.1* 11.0* 10.5* 10.2*  HCT 31.1* 31.5* 30.2* 29.5*  PLT 113* 116* 109* 107*    COAGS: No results for input(s): INR, APTT in the last 8760 hours.  BMP:  Recent Labs  01/30/15 1435 01/30/15 2018 01/31/15 0815 02/01/15 0211  NA 146*  --  142 140  K 5.2*  --  5.5* 4.9  CL 107  --  106 101  CO2 19*  --  20* 26  GLUCOSE 100*  --  127* 112*  BUN 76*  --  80* 57*  CALCIUM 8.9  --  8.6*  8.5*  CREATININE 12.69* 13.09* 14.38* 11.73*  GFRNONAA 3* 3* 3* 3*  GFRAA 4* 4* 3* 4*    LIVER FUNCTION TESTS:  Recent Labs  01/30/15 1435 01/31/15 0815 02/01/15 0211  BILITOT 1.5*  --   --   AST 28  --   --   ALT 28  --   --   ALKPHOS 64  --   --   PROT 6.5  --   --   ALBUMIN 3.9 3.3* 2.9*   Assessment and Plan: Acute kidney injury Hematuria Proteinuria  Seen by Nephrology, request for image guided renal biopsy The patient will be NPO after midnight, blood thinners to be held, labs-will  check INR in am, vitals have been reviewed, will check BP in am. Risks and Benefits discussed with the patient including, but not limited to bleeding, infection, damage to adjacent structures or low yield requiring additional tests. All of the patient's questions were answered, patient is agreeable to proceed. Consent signed and in chart. HTN-controlled COPD Anemia Thrombocytopenia, will check CBC in am   Thank you for this interesting consult.  I greatly enjoyed meeting Justin Woods and look forward to participating in their care.  SignedHedy Jacob 02/01/2015, 10:28 AM   I spent a total of 40 Minutes in face to face in clinical consultation, greater than 50% of which was counseling/coordinating care for acute kidney injury.

## 2015-02-01 NOTE — Evaluation (Signed)
Physical Therapy Evaluation Patient Details Name: Justin Woods MRN: [id number] DOB: [birth year] Today's Date: 02/01/2015   History of Present Illness  Justin Woods is a 81 y.o. male who presented to his PCP with c/o fatigue, LE swelling, SOB, and decreased urine output for approximately 1 week, he was sent to the urologist who then recommended he go to the ED with a Cr of 12.6 Labs reveal acute kidney injury and UA with proteinuria and hematuria. He has started dialysis yesterday and states his LE swelling and sob have improved. Nephrology has seen the patient and requested IR to perform renal biopsy. He denies any chest pain or palpitations. He denies any active signs of bleeding or excessive bruising. The patient denies any history of sleep apnea or chronic oxygen use. He has previously tolerated sedation without complications during a colonoscopy.   Clinical Impression  Pt admitted with above diagnosis. Pt currently with functional limitations due to the deficits listed below (see PT Problem List). Pt should progress and go home with wife with HHPT f/u and RW use.   Pt will benefit from skilled PT to increase their independence and safety with mobility to allow discharge to the venue listed below.      Follow Up Recommendations Home health PT;Supervision/Assistance - 24 hour    Equipment Recommendations  Rolling walker with 5" wheels;Other (comment) (possibly home O2)    Recommendations for Other Services       Precautions / Restrictions Precautions Precautions: Fall Restrictions Weight Bearing Restrictions: No      Mobility  Bed Mobility               General bed mobility comments: in chair on arrival.  Transfers Overall transfer level: Modified independent Equipment used: Rolling walker (2 wheeled)             General transfer comment: pt took incr time to achieve full stand but did not need help.  Ambulation/Gait Ambulation/Gait assistance: Min guard;+2  safety/equipment Ambulation Distance (Feet): 200 Feet Assistive device: Rolling walker (2 wheeled) Gait Pattern/deviations: Step-through pattern;Decreased stride length;Trunk flexed;Wide base of support   Gait velocity interpretation: Below normal speed for age/gender General Gait Details: Pt able to ambulate with rW with good stability overall.  Pt with flexed posture which he has had for awhile apparently needing cues to stand tall.  Pt also desat with activity.   Stairs            Wheelchair Mobility    Modified Rankin (Stroke Patients Only)       Balance Overall balance assessment: Needs assistance         Standing balance support: Bilateral upper extremity supported;During functional activity Standing balance-Leahy Scale: Poor Standing balance comment: Pt reliant on RW for support.                               Pertinent Vitals/Pain Pain Assessment: No/denies pain    SATURATION QUALIFICATIONS: (This note is used to comply with regulatory documentation for home oxygen)  Patient Saturations on Room Air at Rest = 94%  Patient Saturations on Room Air while Ambulating = 84-86%  Patient Saturations on 2-3 Liters of oxygen while Ambulating = 88-92%  Please briefly explain why patient needs home oxygen:Pt needed 3 L O2 to keep sats >90% with activity.  May need home O2.  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     Prior Function Level of Independence: Independent               Hand Dominance        Extremity/Trunk Assessment   Upper Extremity Assessment: Defer to OT  evaluation           Lower Extremity Assessment: Generalized weakness      Cervical / Trunk Assessment: Kyphotic  Communication   Communication: No difficulties  Cognition Arousal/Alertness: Awake/alert Behavior During Therapy: WFL for tasks assessed/performed Overall Cognitive Status: Within Functional Limits for tasks assessed                      General Comments      Exercises General Exercises - Lower Extremity Ankle Circles/Pumps: AROM;Both;10 reps;Seated Long Arc Quad: AROM;Both;10 reps;Seated Hip Flexion/Marching: AROM;Both;10 reps;Seated      Assessment/Plan    PT Assessment Patient needs continued PT services  PT Diagnosis Generalized weakness   PT Problem List Decreased activity tolerance;Decreased balance;Decreased mobility;Decreased knowledge of use of DME;Decreased safety awareness;Decreased knowledge of precautions  PT Treatment Interventions DME instruction;Gait training;Functional mobility training;Therapeutic exercise;Therapeutic activities;Balance training;Patient/family education;Stair training   PT Goals (Current goals can be found in the Care Plan section) Acute Rehab PT Goals Patient Stated Goal: to gohome PT Goal Formulation: With patient Time For Goal Achievement: 02/08/15 Potential to Achieve Goals: Good    Frequency Min 3X/week   Barriers to discharge        Co-evaluation               End of Session Equipment Utilized During Treatment: Gait belt;Oxygen Activity Tolerance: Patient limited by fatigue Patient left: in chair;with call bell/phone within reach;with family/visitor present Nurse Communication: Mobility status         Time: 1020-1036 PT Time Calculation (min) (ACUTE ONLY): 16 min   Charges:   PT Evaluation $Initial PT Evaluation Tier I: 1 Procedure     PT G CodesDenice Paradise 02/26/15, 1:36 PM [facility] Acute Rehabilitation [phone] (pager)

## 2015-02-01 NOTE — Progress Notes (Signed)
[facility] TEAM 1 - Stepdown/ICU TEAM Progress Note  Justin Woods BPZ:[id number] DOB: 08/19/1930 DOA: 01/30/2015 PCP: Wenda Low, MD  Admit HPI / Brief Narrative: 81yo WM PMHx COPD, HTN, CTD, Nephrolithiasis, DVT right calf. Went to see his PCP c/o fatigue with decreased urine output. Labs noted creatinine of 12.6, w/ baseline at PCP office in October 2015 of 1.17. CT abdomen and pelvis did not show evidence of renal stones or hydronephrosis, only mild pre-ureteral stranding. Patient was seen by Urology Dr.Herrick and had Foley catheter inserted with minimal urine output (total 150 mL).   HPI/Subjective: 6/23 A/O 4, states SOB has decreased since first HD session.  Assessment/Plan: Acute renal failure/ESRD on HD? (Baseline creatinine 1.27 May 2014) - idiopathic - poor response to diuretic  -Patient will continue HD per nephrology. -Renal biopsy in the a.m.; heparin DC'd.   Hyperkalemia -Resolved, monitor closely   Acute hypoxic respiratory failure - pulmonary edema -Improving with hemodialysis  -Strict in and out  -Echocardiogram pending  Anemia -Likely related to kidney failure - workup underway  Hypertension -Blood pressure reasonably controlled at present time   Prior history of DVT during hospitalization  COPD -No evidence of acute exacerbation at present time  Chronic arthritis -Denies focal joint pain at this time    Code Status: FULL Family Communication: Wife present at time of exam Disposition Plan: Per nephrology    Consultants: Dr.Wesam Kathryne Sharper (PCCM) Dr.Cynthia Lorrene Reid (nephrology) Dr.Herrick (urology)   Procedure/Significant Events:    Culture   Antibiotics:   DVT prophylaxis: SCDs + Maxwell heparin   Devices    LINES / TUBES:  Lt IJ HD cath 6/22>>    Continuous Infusions:   Objective: VITAL SIGNS: Temp: 97.6 F (36.4 C) (06/23 0754) Temp Source: Oral (06/23 0754) BP: 127/54 mmHg (06/23 0754) Pulse Rate: 76  (06/23 0754) SPO2; FIO2:   Intake/Output Summary (Last 24 hours) at 02/01/15 1048 Last data filed at 02/01/15 0900  Gross per 24 hour  Intake    120 ml  Output   1942 ml  Net  -1822 ml     Exam: General: A/O 4, states SOB has decreased, will still requiring O2 to maintain sats, sitting in chair comfortably.  Eyes: Negative headache, eye pain, double vision, negative retinal hemorrhage ENT: Negative Runny nose, negative ear pain, negative tinnitus, negative gingival bleeding Neck:  Negative scars, masses, torticollis, lymphadenopathy, positive JVD, left IJ in place negative sign of infection covered and clean. Lungs: Clear to auscultation bilaterally, positive crackles RLL, negative wheezing Cardiovascular: Regular rate and rhythm without murmur gallop or rub normal S1 and S2 Abdomen:negative abdominal pain, negative dysphagia, Nontender, nondistended, soft, bowel sounds positive, no rebound, no ascites, no appreciable mass Extremities: No significant cyanosis, clubbing, or edema bilateral lower extremities Psychiatric:  Negative depression, negative anxiety, negative fatigue, negative mania  Neurologic:  Cranial nerves II through XII intact, tongue/uvula midline, all extremities muscle strength 5/5, sensation intact throughout, negative dysarthria, negative expressive aphasia, negative receptive aphasia.     Data Reviewed: Basic Metabolic Panel:  Recent Labs Lab 01/30/15 1435 01/30/15 2018 01/31/15 0815 02/01/15 0211  NA 146*  --  142 140  K 5.2*  --  5.5* 4.9  CL 107  --  106 101  CO2 19*  --  20* 26  GLUCOSE 100*  --  127* 112*  BUN 76*  --  80* 57*  CREATININE 12.69* 13.09* 14.38* 11.73*  CALCIUM 8.9  --  8.6* 8.5*  PHOS  --   --  8.6* 8.0*   Liver Function Tests:  Recent Labs Lab 01/30/15 1435 01/31/15 0815 02/01/15 0211  AST 28  --   --   ALT 28  --   --   ALKPHOS 64  --   --   BILITOT 1.5*  --   --   PROT 6.5  --   --   ALBUMIN 3.9 3.3* 2.9*   No  results for input(s): LIPASE, AMYLASE in the last 168 hours. No results for input(s): AMMONIA in the last 168 hours. CBC:  Recent Labs Lab 01/30/15 1435 01/30/15 2018 01/31/15 0307 02/01/15 0211  WBC 9.4 10.3 9.1 8.7  NEUTROABS 5.8  --   --   --   HGB 11.1* 11.0* 10.5* 10.2*  HCT 31.1* 31.5* 30.2* 29.5*  MCV 94.0 95.5 95.0 94.2  PLT 113* 116* 109* 107*   Cardiac Enzymes: No results for input(s): CKTOTAL, CKMB, CKMBINDEX, TROPONINI in the last 168 hours. BNP (last 3 results) No results for input(s): BNP in the last 8760 hours.  ProBNP (last 3 results) No results for input(s): PROBNP in the last 8760 hours.  CBG: No results for input(s): GLUCAP in the last 168 hours.  Recent Results (from the past 240 hour(s))  MRSA PCR Screening     Status: None   Collection Time: 01/30/15  7:01 PM  Result Value Ref Range Status   MRSA by PCR NEGATIVE NEGATIVE Final    Comment:        The GeneXpert MRSA Assay (FDA approved for NASAL specimens only), is one component of a comprehensive MRSA colonization surveillance program. It is not intended to diagnose MRSA infection nor to guide or monitor treatment for MRSA infections.      Studies:  Recent x-ray studies have been reviewed in detail by the Attending Physician  Scheduled Meds:  Scheduled Meds: . antiseptic oral rinse  7 mL Mouth Rinse BID  . atenolol  25 mg Oral QHS  . budesonide-formoterol  2 puff Inhalation BID  . fluticasone  1 spray Each Nare Daily  . methylPREDNISolone (SOLU-MEDROL) injection  500 mg Intravenous Q24H  . sodium chloride  3 mL Intravenous Q12H    Time spent on care of this patient: 40 mins   Keyry Iracheta, Geraldo Docker , MD  [facility] Office  [phone] Pager - [phone]  On-Call/Text Page:      [email or web address]      password [email or web address]  If 7PM-7AM, please contact night-coverage [email or web address] Password [facility] 02/01/2015, 10:48 AM   LOS: 2 days   Care during the described time interval was  provided by me .  I have reviewed this patient's available data, including medical history, events of note, physical examination, and all test results as part of my evaluation. I have personally reviewed and interpreted all radiology studies.   Dia Crawford, MD [phone] Pager

## 2015-02-02 ENCOUNTER — Other Ambulatory Visit (HOSPITAL_COMMUNITY): Payer: PPO

## 2015-02-02 ENCOUNTER — Inpatient Hospital Stay (HOSPITAL_COMMUNITY): Payer: PPO

## 2015-02-02 DIAGNOSIS — R0600 Dyspnea, unspecified: Secondary | ICD-10-CM

## 2015-02-02 LAB — COMPREHENSIVE METABOLIC PANEL
ALT: 19 U/L (ref 17–63)
AST: 29 U/L (ref 15–41)
Albumin: 3 g/dL — ABNORMAL LOW (ref 3.5–5.0)
Alkaline Phosphatase: 71 U/L (ref 38–126)
Anion gap: 14 (ref 5–15)
BUN: 47 mg/dL — ABNORMAL HIGH (ref 6–20)
CALCIUM: 8.1 mg/dL — AB (ref 8.9–10.3)
CO2: 25 mmol/L (ref 22–32)
Chloride: 98 mmol/L — ABNORMAL LOW (ref 101–111)
Creatinine, Ser: 9.49 mg/dL — ABNORMAL HIGH (ref 0.61–1.24)
GFR calc Af Amer: 5 mL/min — ABNORMAL LOW (ref >60)
GFR calc non Af Amer: 4 mL/min — ABNORMAL LOW (ref >60)
Glucose, Bld: 210 mg/dL — ABNORMAL HIGH (ref 65–99)
Potassium: 4.2 mmol/L (ref 3.5–5.1)
Sodium: 137 mmol/L (ref 135–145)
Total Bilirubin: 0.9 mg/dL (ref 0.3–1.2)
Total Protein: 5.9 g/dL — ABNORMAL LOW (ref 6.5–8.1)

## 2015-02-02 LAB — CBC
HEMATOCRIT: 28.9 % — AB (ref 39.0–52.0)
Hemoglobin: 10.3 g/dL — ABNORMAL LOW (ref 13.0–17.0)
MCH: 32.7 pg (ref 26.0–34.0)
MCHC: 35.6 g/dL (ref 30.0–36.0)
MCV: 91.7 fL (ref 78.0–100.0)
PLATELETS: 114 10*3/uL — AB (ref 150–400)
RBC: 3.15 MIL/uL — AB (ref 4.22–5.81)
RDW: 13.3 % (ref 11.5–15.5)
WBC: 10.5 10*3/uL (ref 4.0–10.5)

## 2015-02-02 LAB — RENAL FUNCTION PANEL
ALBUMIN: 3 g/dL — AB (ref 3.5–5.0)
ANION GAP: 15 (ref 5–15)
BUN: 48 mg/dL — AB (ref 6–20)
CALCIUM: 8.1 mg/dL — AB (ref 8.9–10.3)
CHLORIDE: 97 mmol/L — AB (ref 101–111)
CO2: 24 mmol/L (ref 22–32)
CREATININE: 9.48 mg/dL — AB (ref 0.61–1.24)
GFR calc Af Amer: 5 mL/min — ABNORMAL LOW (ref >60)
GFR calc non Af Amer: 4 mL/min — ABNORMAL LOW (ref >60)
Glucose, Bld: 208 mg/dL — ABNORMAL HIGH (ref 65–99)
PHOSPHORUS: 6.5 mg/dL — AB (ref 2.5–4.6)
Potassium: 4.2 mmol/L (ref 3.5–5.1)
Sodium: 136 mmol/L (ref 135–145)

## 2015-02-02 LAB — ANTINUCLEAR ANTIBODIES, IFA: ANTINUCLEAR ANTIBODIES, IFA: NEGATIVE

## 2015-02-02 LAB — MAGNESIUM: Magnesium: 2.1 mg/dL (ref 1.7–2.4)

## 2015-02-02 LAB — KAPPA/LAMBDA LIGHT CHAINS
Kappa, lambda light chain ratio: 804.28 — ABNORMAL HIGH (ref 0.26–1.65)
Lambda free light chains: 14.95 mg/L (ref 5.71–26.30)

## 2015-02-02 LAB — PROTIME-INR
INR: 1.11 (ref 0.00–1.49)
Prothrombin Time: 14.5 seconds (ref 11.6–15.2)

## 2015-02-02 MED ORDER — MIDAZOLAM HCL 2 MG/2ML IJ SOLN
INTRAMUSCULAR | Status: AC
  Filled 2015-02-02: qty 2

## 2015-02-02 MED ORDER — MIDAZOLAM HCL 2 MG/2ML IJ SOLN
INTRAMUSCULAR | Status: AC | PRN
  Administered 2015-02-02: 1 mg via INTRAVENOUS

## 2015-02-02 MED ORDER — FENTANYL CITRATE (PF) 100 MCG/2ML IJ SOLN
INTRAMUSCULAR | Status: AC
  Filled 2015-02-02: qty 2

## 2015-02-02 MED ORDER — FENTANYL CITRATE (PF) 100 MCG/2ML IJ SOLN
INTRAMUSCULAR | Status: AC | PRN
  Administered 2015-02-02: 25 ug via INTRAVENOUS

## 2015-02-02 MED ORDER — LIDOCAINE-EPINEPHRINE 1 %-1:100000 IJ SOLN
INTRAMUSCULAR | Status: AC
  Filled 2015-02-02: qty 1

## 2015-02-02 NOTE — Progress Notes (Signed)
[facility] Rounding Note Subjective:  Had HD #2 yesterday Feeling MUCH better Breathing fine now without O2 with good sats on room air Was not weighed either pre or post dialysis  For renal bx today  Objective Vital signs in last 24 hours: Filed Vitals:   02/01/15 1945 02/01/15 2055 02/02/15 0000 02/02/15 0400  BP: 141/60 137/65 109/42 110/40  Pulse: 83 74 66 68  Temp: 97.7 F (36.5 C)  97.6 F (36.4 C) 97.9 F (36.6 C)  TempSrc: Axillary  Axillary Oral  Resp: 20  20 20   Height:      Weight:      SpO2: 93%  91% 93%   Weight change:   Intake/Output Summary (Last 24 hours) at 02/02/15 0743 Last data filed at 02/02/15 0400  Gross per 24 hour  Intake    203 ml  Output   1820 ml  Net  -1617 ml    Physical Exam:  BP 110/40 mmHg  Pulse 68  Temp(Src) 97.9 F (36.6 C) (Oral)  Resp 20  Ht 5\' 6"  (1.676 m)  Wt 91.1 kg (200 lb 13.4 oz)  BMI 32.43 kg/m2  SpO2 93%  Elderly  WM Oriented, awake and alert off O2  No JVD Left IJ tem HD cath (6/22) Lungs clear S1S2 No S3 Abd protuberant, + BS, no tenderness today, less tight No edema. SCD's Minimal light colored urine in foley catheter No asterixus   Recent Labs Lab 01/30/15 1435 01/30/15 2018 01/31/15 0815 02/01/15 0211 02/02/15 0454  NA 146*  --  142 140 137  136  K 5.2*  --  5.5* 4.9 4.2  4.2  CL 107  --  106 101 98*  97*  CO2 19*  --  20* 26 25  24   GLUCOSE 100*  --  127* 112* 210*  208*  BUN 76*  --  80* 57* 47*  48*  CREATININE 12.69* 13.09* 14.38* 11.73* 9.49*  9.48*  CALCIUM 8.9  --  8.6* 8.5* 8.1*  8.1*  PHOS  --   --  8.6* 8.0* 6.5*      Recent Labs Lab 01/30/15 1435 01/31/15 0815 02/01/15 0211 02/02/15 0454  AST 28  --   --  29  ALT 28  --   --  19  ALKPHOS 64  --   --  71  BILITOT 1.5*  --   --  0.9  PROT 6.5  --   --  5.9*  ALBUMIN 3.9 3.3* 2.9* 3.0*  3.0*     Recent Labs Lab 01/30/15 1435 01/30/15 2018 01/31/15 0307 02/01/15 0211 02/02/15 0454  WBC 9.4  10.3 9.1 8.7 10.5  NEUTROABS 5.8  --   --   --   --   HGB 11.1* 11.0* 10.5* 10.2* 10.3*  HCT 31.1* 31.5* 30.2* 29.5* 28.9*  MCV 94.0 95.5 95.0 94.2 91.7  PLT 113* 116* 109* 107* 114*   Serologies and Urine Studies  Results for ERVIE, MCCARD (MRN [id number]) as of 01/31/2015 08:32  Ref. Range 01/30/2015 13:24  Appearance Latest Ref Range: CLEAR  CLOUDY (A)  Bacteria, UA Latest Ref Range: RARE  FEW (A)  Bilirubin Urine Latest Ref Range: NEGATIVE  NEGATIVE  Color, Urine Latest Ref Range: YELLOW  YELLOW  Glucose Latest Ref Range: NEGATIVE mg/dL NEGATIVE  Hgb urine dipstick Latest Ref Range: NEGATIVE  LARGE (A)  Ketones, ur Latest Ref Range: NEGATIVE mg/dL NEGATIVE  Leukocytes, UA Latest Ref Range: NEGATIVE  SMALL (A)  Nitrite  Latest Ref Range: NEGATIVE  NEGATIVE  pH Latest Ref Range: 5.0-8.0  7.5  Protein Latest Ref Range: NEGATIVE mg/dL 100 (A)  RBC / HPF Latest Ref Range: <3 RBC/hpf 21-50  Specific Gravity, Urine Latest Ref Range: 1.005-1.030  1.008  Squamous Epithelial / LPF Latest Ref Range: RARE  RARE  Urobilinogen, UA Latest Ref Range: 0.0-1.0 mg/dL 0.2  WBC, UA Latest Ref Range: <3 WBC/hpf 3-6   ANCA negative, anti GBM negative C3, C4 normal (104, 22) Hep B negative SPEP faint band in gamma region suspicious for monoclonal Ig UPC 5.5  grams proteinura  ANA, serum free light chains - all pending  Studies/Results: Dg Chest Port 1 View  01/31/2015   CLINICAL DATA:  Initial encounter for hemodialysis catheter placement.  EXAM: PORTABLE CHEST - 1 VIEW  COMPARISON:  01/30/2015.  FINDINGS: 1142 hrs. Left IJ dialysis catheter tip overlies the left hilum. The tip projects slightly more lateral than typical seen which is probably related to rightward patient rotation. There is pulmonary vascular congestion without overt pulmonary edema. No focal airspace consolidation or pleural effusion. Cardiopericardial silhouette is at upper limits of normal for size. Telemetry leads overlie the  chest. No evidence for pneumothorax.  IMPRESSION: 1. No evidence for pneumothorax status post central line placement.   Electronically Signed   By: Misty Stanley M.D.   On: 01/31/2015 12:02   Medications:   . antiseptic oral rinse  7 mL Mouth Rinse BID  . atenolol  25 mg Oral QHS  . budesonide-formoterol  2 puff Inhalation BID  . fluticasone  1 spray Each Nare Daily  . methylPREDNISolone (SOLU-MEDROL) injection  500 mg Intravenous Q24H  . sodium chloride  3 mL Intravenous Q12H   Background Active 81 yo WM with fairly benign PMH of HTN, COPD, arthritis. Baseline creatinine of 1.27 May 2014.  Presented with several days of worsening fatigue, decreased UOP, dyspnea who presents with AKI, creatinine of 12-13, UA showing proteinuria and hematuria, and we were asked to evaluate. Only relevant drug exposures were daily ibuprofen and PPI  1. AKI - Oliguric. Etiology not clear. Active urine sediment suggestive of acute glomerular process. Could be NSAID related but hematuria not usually a prominent finding with NSAID related lesions such as ATIN w/minmal change. PPI was stopped (but PPI associated lesion usually acute interstitial nephritis - sediment again is a little atypical)   No clues from lab results so far. For percutaneous renal biopsy by IR today. Day 2 empiric pulse solumedrol 500 mg (plan 3 doses). Will HD again tomorrow.  2. Anemia -TSat 40%. Hb just over 10. No iron or ESA need just yet. 3. COPD 4. Arthritis - stopped NSAIDS on admission 5. HTN - BP soft. Stopped amlodipine. Changed atenolol to hs   Jamal Maes, MD [facility] [phone] pager 02/02/2015, 7:43 AM

## 2015-02-02 NOTE — Sedation Documentation (Addendum)
Patient denies pain and is resting comfortably. Pt following commands to hold breath when asked by Dr. Pascal Lux.

## 2015-02-02 NOTE — Progress Notes (Signed)
  Echocardiogram 2D Echocardiogram has been performed.  Donata Clay 02/02/2015, 12:02 PM

## 2015-02-02 NOTE — Sedation Documentation (Signed)
Pt AAO x 3. Arrived to ultrasound w/ no IV access for procedure requiring sedation. Will attempt placement of IV.

## 2015-02-02 NOTE — Progress Notes (Signed)
Inpatient Diabetes Program Recommendations  AACE/ADA: New Consensus Statement on Inpatient Glycemic Control (2013)  Target Ranges:  Prepandial:   less than 140 mg/dL      Peak postprandial:   less than 180 mg/dL (1-2 hours)      Critically ill patients:  140 - 180 mg/dL   Results for Justin Woods, Justin Woods (MRN [id number]) as of 02/02/2015 12:31  Ref. Range 01/30/2015 14:35 01/31/2015 08:15 02/01/2015 02:11 02/02/2015 04:54 02/02/2015 04:54  Glucose Latest Ref Range: 65-99 mg/dL 100 (H) 127 (H) 112 (H) 208 (H) 210 (H)    Diabetes history: No Outpatient Diabetes medications: NA Current orders for Inpatient glycemic control: None  Inpatient Diabetes Program Recommendations Correction (SSI): While inpatient and ordered steroids, please consider ordering CBGs with Novolog sensitive correction.  Note: Ordered Solumedrol 500 mg Q24H which is likely cause of hyperglycemia.   Thanks, Barnie Alderman, RN, MSN, CCRN, CDE Diabetes Coordinator Inpatient Diabetes Program [phone] (Team Pager from [place] to [place]) [phone] ([facility] office) [phone] [facility] office) [phone] [facility] office)

## 2015-02-02 NOTE — Procedures (Signed)
Technically successful US guided random biopsy of right kidney.  No immediate complications.

## 2015-02-02 NOTE — Progress Notes (Signed)
[place] TEAM 1 - Stepdown/ICU TEAM Progress Note  ADIAN JABLONOWSKI WYO:[id number] DOB: 03/29/1931 DOA: 01/30/2015 PCP: Wenda Low, MD  Admit HPI / Brief Narrative: 81yo male with history of HTN and renal stones who went to see his PCP c/o fatigue with decreased urine output.  Labs noted creatinine of 12.6, w/ baseline at PCP office in October 2015 of 1.17.  CT abdomen and pelvis did not show evidence of renal stones or hydronephrosis, only mild pre-ureteral stranding.  Patient was seen by Urology Dr.Herrick an had Foley catheter inserted with minimal urine output (total 150 mL).  HPI/Subjective: The patient is resting comfortably at the present time.  He states that he still feels slightly short of breath but that this is not as severe as it was at the time of his presentation.  He denies chest pain abdominal pain nausea vomiting fevers or chills.  Assessment/Plan:  Acute renal failure Baseline creatinine 1.27 May 2014 - idiopathic - poor response to diuretic - Nephrology directing HD - renal bx today  Hyperkalemia Due to acute renal failure - corrected with hemodialysis  Acute hypoxic respiratory failure - pulmonary edema Most consistent with pulmonary edema related to acute renal failure - steadily improving - remains somewhat short of breath but has been able to be titrated to room air - continue to follow with ongoing dialysis  Anemia Likely related to kidney failure - no indication for epo or iron at this time  Hypertension Blood pressure reasonably controlled at present time - follow without change today  Prior history of DVT during hospitalization  COPD No evidence of acute exacerbation at present time  Chronic arthritis Denies focal joint pain at this time  Code Status: FULL Family Communication: Spoke at length with patient and wife at bedside again today Disposition Plan: SDU  Consultants: Nephrology   Procedures: 6/22 insertion L IJ temporary  hemodialysis catheter 6/24 kidney bx  Antibiotics: None   DVT prophylaxis: SCDs (renal bx today)  Objective: Blood pressure 133/63, pulse 64, temperature 98.1 F (36.7 C), temperature source Oral, resp. rate 16, height 5\' 6"  (1.676 m), weight 91.1 kg (200 lb 13.4 oz), SpO2 90 %.  Intake/Output Summary (Last 24 hours) at 02/02/15 1558 Last data filed at 02/02/15 0400  Gross per 24 hour  Intake     33 ml  Output   1820 ml  Net  -1787 ml   Exam: General: Respirations appear comfortable - alert and conversant Lungs: No appreciable crackles or wheezes today Cardiovascular: Regular rate and rhythm without gallop or rub Abdomen: Nontender, mildly protuberant, soft, bowel sounds positive, no rebound, no ascites, no appreciable mass Extremities: No significant cyanosis, or clubbing, 1+ edema bilateral lower extremities  Data Reviewed: Basic Metabolic Panel:  Recent Labs Lab 01/30/15 1435 01/30/15 2018 01/31/15 0815 02/01/15 0211 02/02/15 0454  NA 146*  --  142 140 137  136  K 5.2*  --  5.5* 4.9 4.2  4.2  CL 107  --  106 101 98*  97*  CO2 19*  --  20* 26 25  24   GLUCOSE 100*  --  127* 112* 210*  208*  BUN 76*  --  80* 57* 47*  48*  CREATININE 12.69* 13.09* 14.38* 11.73* 9.49*  9.48*  CALCIUM 8.9  --  8.6* 8.5* 8.1*  8.1*  MG  --   --   --   --  2.1  PHOS  --   --  8.6* 8.0* 6.5*    CBC:  Recent Labs Lab 01/30/15 1435 01/30/15 2018 01/31/15 0307 02/01/15 0211 02/02/15 0454  WBC 9.4 10.3 9.1 8.7 10.5  NEUTROABS 5.8  --   --   --   --   HGB 11.1* 11.0* 10.5* 10.2* 10.3*  HCT 31.1* 31.5* 30.2* 29.5* 28.9*  MCV 94.0 95.5 95.0 94.2 91.7  PLT 113* 116* 109* 107* 114*    Liver Function Tests:  Recent Labs Lab 01/30/15 1435 01/31/15 0815 02/01/15 0211 02/02/15 0454  AST 28  --   --  29  ALT 28  --   --  19  ALKPHOS 64  --   --  71  BILITOT 1.5*  --   --  0.9  PROT 6.5  --   --  5.9*  ALBUMIN 3.9 3.3* 2.9* 3.0*  3.0*    Recent Results (from the  past 240 hour(s))  MRSA PCR Screening     Status: None   Collection Time: 01/30/15  7:01 PM  Result Value Ref Range Status   MRSA by PCR NEGATIVE NEGATIVE Final    Comment:        The GeneXpert MRSA Assay (FDA approved for NASAL specimens only), is one component of a comprehensive MRSA colonization surveillance program. It is not intended to diagnose MRSA infection nor to guide or monitor treatment for MRSA infections.      Studies:   Recent x-ray studies have been reviewed in detail by the Attending Physician  Scheduled Meds:  Scheduled Meds: . antiseptic oral rinse  7 mL Mouth Rinse BID  . atenolol  25 mg Oral QHS  . budesonide-formoterol  2 puff Inhalation BID  . fentaNYL      . fluticasone  1 spray Each Nare Daily  . lidocaine-EPINEPHrine      . methylPREDNISolone (SOLU-MEDROL) injection  500 mg Intravenous Q24H  . midazolam      . sodium chloride  3 mL Intravenous Q12H    Time spent on care of this patient: 25 mins   Elisandro Jarrett T , MD   [facility] Office  [phone] Pager - Text Page per Shea Evans as per below:  On-Call/Text Page:      [email or web address]      password [email or web address]  If 7PM-7AM, please contact night-coverage [email or web address] Password [email or web address] 02/02/2015, 3:58 PM   LOS: 3 days

## 2015-02-02 NOTE — Sedation Documentation (Signed)
Pathology down to review specimens.

## 2015-02-03 ENCOUNTER — Inpatient Hospital Stay (HOSPITAL_COMMUNITY): Payer: PPO

## 2015-02-03 DIAGNOSIS — N028 Recurrent and persistent hematuria with other morphologic changes: Secondary | ICD-10-CM

## 2015-02-03 LAB — RENAL FUNCTION PANEL
Albumin: 2.9 g/dL — ABNORMAL LOW (ref 3.5–5.0)
Anion gap: 19 — ABNORMAL HIGH (ref 5–15)
BUN: 89 mg/dL — ABNORMAL HIGH (ref 6–20)
CO2: 22 mmol/L (ref 22–32)
Calcium: 8.6 mg/dL — ABNORMAL LOW (ref 8.9–10.3)
Chloride: 97 mmol/L — ABNORMAL LOW (ref 101–111)
Creatinine, Ser: 12.39 mg/dL — ABNORMAL HIGH (ref 0.61–1.24)
GFR calc Af Amer: 4 mL/min — ABNORMAL LOW (ref >60)
GFR calc non Af Amer: 3 mL/min — ABNORMAL LOW (ref >60)
Glucose, Bld: 172 mg/dL — ABNORMAL HIGH (ref 65–99)
Phosphorus: 9.1 mg/dL — ABNORMAL HIGH (ref 2.5–4.6)
Potassium: 4.5 mmol/L (ref 3.5–5.1)
Sodium: 138 mmol/L (ref 135–145)

## 2015-02-03 LAB — CBC
HCT: 28.4 % — ABNORMAL LOW (ref 39.0–52.0)
Hemoglobin: 10.1 g/dL — ABNORMAL LOW (ref 13.0–17.0)
MCH: 32.9 pg (ref 26.0–34.0)
MCHC: 35.6 g/dL (ref 30.0–36.0)
MCV: 92.5 fL (ref 78.0–100.0)
PLATELETS: 128 10*3/uL — AB (ref 150–400)
RBC: 3.07 MIL/uL — AB (ref 4.22–5.81)
RDW: 13.4 % (ref 11.5–15.5)
WBC: 14.7 10*3/uL — ABNORMAL HIGH (ref 4.0–10.5)

## 2015-02-03 LAB — POCT I-STAT, CHEM 8
BUN: 42 mg/dL — ABNORMAL HIGH (ref 6–20)
CALCIUM ION: 1.06 mmol/L — AB (ref 1.13–1.30)
CREATININE: 7.5 mg/dL — AB (ref 0.61–1.24)
Chloride: 98 mmol/L — ABNORMAL LOW (ref 101–111)
Glucose, Bld: 200 mg/dL — ABNORMAL HIGH (ref 65–99)
HCT: 33 % — ABNORMAL LOW (ref 39.0–52.0)
Hemoglobin: 11.2 g/dL — ABNORMAL LOW (ref 13.0–17.0)
POTASSIUM: 3.9 mmol/L (ref 3.5–5.1)
SODIUM: 137 mmol/L (ref 135–145)
TCO2: 23 mmol/L (ref 0–100)

## 2015-02-03 LAB — TYPE AND SCREEN
ABO/RH(D): O POS
Antibody Screen: NEGATIVE

## 2015-02-03 LAB — ABO/RH: ABO/RH(D): O POS

## 2015-02-03 MED ORDER — ACD FORMULA A 0.73-2.45-2.2 GM/100ML VI SOLN
Status: AC
  Administered 2015-02-03: 500 mL
  Filled 2015-02-03: qty 500

## 2015-02-03 MED ORDER — ACETAMINOPHEN 325 MG PO TABS: 650.0000 mg | ORAL_TABLET | ORAL | Status: DC | PRN

## 2015-02-03 MED ORDER — DIPHENHYDRAMINE HCL 25 MG PO CAPS: 25.0000 mg | ORAL_CAPSULE | Freq: Four times a day (QID) | ORAL | Status: DC | PRN

## 2015-02-03 MED ORDER — ACD FORMULA A 0.73-2.45-2.2 GM/100ML VI SOLN
Status: AC
  Administered 2015-02-03: 200 mL
  Filled 2015-02-03: qty 500

## 2015-02-03 MED ORDER — HEPARIN SODIUM (PORCINE) 1000 UNIT/ML IJ SOLN
1000.0000 [IU] | Freq: Once | INTRAMUSCULAR | Status: DC
  Filled 2015-02-03: qty 1

## 2015-02-03 MED ORDER — CALCIUM CARBONATE ANTACID 500 MG PO CHEW
2.0000 | CHEWABLE_TABLET | ORAL | Status: AC
  Administered 2015-02-03 (×2): 400 mg via ORAL
  Filled 2015-02-03 (×2): qty 2

## 2015-02-03 MED ORDER — ACD FORMULA A 0.73-2.45-2.2 GM/100ML VI SOLN
500.0000 mL | Status: DC
  Filled 2015-02-03: qty 500

## 2015-02-03 MED ORDER — ANTICOAGULANT SODIUM CITRATE 4% (200MG/5ML) IV SOLN
5.0000 mL | Freq: Once | Status: AC
  Administered 2015-02-03: 5 mL via INTRAVENOUS
  Filled 2015-02-03: qty 250

## 2015-02-03 MED ORDER — CALCIUM CARBONATE ANTACID 500 MG PO CHEW
CHEWABLE_TABLET | ORAL | Status: AC
  Administered 2015-02-03: 400 mg via ORAL
  Filled 2015-02-03: qty 2

## 2015-02-03 MED ORDER — CALCIUM GLUCONATE 10 % IV SOLN
4.0000 g | Freq: Once | INTRAVENOUS | Status: AC
  Administered 2015-02-03: 4 g via INTRAVENOUS
  Filled 2015-02-03 (×2): qty 40

## 2015-02-03 MED ORDER — DIPHENHYDRAMINE HCL 25 MG PO CAPS
ORAL_CAPSULE | ORAL | Status: AC
  Administered 2015-02-03: 25 mg
  Filled 2015-02-03: qty 1

## 2015-02-03 NOTE — Progress Notes (Signed)
Dr. Lorrene Reid in to speak with family regarding Plasma Exchange.

## 2015-02-03 NOTE — Progress Notes (Signed)
[place] TEAM 1 - Stepdown/ICU TEAM Progress Note  ZAKEE DEERMAN VQQ:[id number] DOB: [birth year] DOA: 01/30/2015 PCP: Wenda Low, MD  Admit HPI / Brief Narrative: 81yo male with history of HTN and renal stones who went to see his PCP c/o fatigue with decreased urine output.  Labs noted creatinine of 12.6, w/ baseline at PCP office in October 2015 of 1.17.  CT abdomen and pelvis did not show evidence of renal stones or hydronephrosis, only mild pre-ureteral stranding.  Patient was seen by Urology Dr.Herrick and had Foley catheter inserted with minimal urine output (total 150 mL).  HPI/Subjective: The patient is seen just prior to being taken upstairs to begin plasmapheresis.  He denies present chest pain nausea vomiting or abdominal pain and reports only minimal shortness of breath.  He is nervous about initiating plasmapheresis.  Multiple questions are answered for him as well as his wife and daughter in the room.  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 Heme now following - for BMbx per IR Monday   Hyperkalemia Due to acute renal failure - corrected with hemodialysis  Acute hypoxic respiratory failure - pulmonary edema Most consistent with pulmonary edema related to acute renal failure - continues to improve with dialysis - continue to follow with ongoing dialysis  Anemia Likely related to kidney failure - no indication for epo or iron at this time  Hypertension Blood pressure controlled at present time - follow without change   Prior history of DVT during hospitalization Unable to use anticoagulation of any type for 2 weeks post renal biopsy  COPD No evidence of acute exacerbation  at present time  Chronic arthritis Denies focal joint pain at this time  Code Status: FULL Family Communication: Spoke at length with patient, wife, and children at bedside today Disposition Plan: SDU  Consultants: Nephrology  Hematology/Oncology  Procedures: 6/22 insertion L IJ temporary hemodialysis catheter 6/24 kidney bx  Antibiotics: None   DVT prophylaxis: SCDs (post renal bx)  Objective: Blood pressure 128/64, pulse 79, temperature 98.1 F (36.7 C), temperature source Oral, resp. rate 22, height 5\' 6"  (1.676 m), weight 90.2 kg (198 lb 13.7 oz), SpO2 93 %.  Intake/Output Summary (Last 24 hours) at 02/03/15 1738 Last data filed at 02/03/15 1048  Gross per 24 hour  Intake      0 ml  Output   2425 ml  Net  -2425 ml   Exam: General: Respirations appear comfortable - alert and conversant - somewhat anxious Lungs: No appreciable crackles or wheezes - improved air movement throughout Cardiovascular: Regular rate and rhythm without gallop or rub Abdomen: Nontender, mildly protuberant, soft, bowel sounds positive, no rebound, no ascites, no mass Extremities: No significant cyanosis, or clubbing, 1+ edema bilateral lower extremities  Data Reviewed: Basic Metabolic Panel:  Recent Labs Lab 01/30/15 1435 01/30/15 2018 01/31/15 0815 02/01/15 0211 02/02/15 0454 02/03/15 0550  NA 146*  --  142 140 137  136 138  K 5.2*  --  5.5* 4.9 4.2  4.2 4.5  CL 107  --  106 101 98*  97* 97*  CO2 19*  --  20* 26 25  24 22   GLUCOSE 100*  --  127* 112* 210*  208* 172*  BUN 76*  --  80* 57* 47*  48* 89*  CREATININE 12.69* 13.09* 14.38* 11.73* 9.49*  9.48* 12.39*  CALCIUM 8.9  --  8.6* 8.5* 8.1*  8.1* 8.6*  MG  --   --   --   --  2.1  --   PHOS  --   --  8.6* 8.0* 6.5* 9.1*    CBC:  Recent Labs Lab 01/30/15 1435 01/30/15 2018 01/31/15 0307 02/01/15 0211 02/02/15 0454 02/03/15 0550  WBC 9.4 10.3 9.1 8.7 10.5 14.7*  NEUTROABS 5.8  --   --   --   --   --   HGB  11.1* 11.0* 10.5* 10.2* 10.3* 10.1*  HCT 31.1* 31.5* 30.2* 29.5* 28.9* 28.4*  MCV 94.0 95.5 95.0 94.2 91.7 92.5  PLT 113* 116* 109* 107* 114* 128*    Liver Function Tests:  Recent Labs Lab 01/30/15 1435 01/31/15 0815 02/01/15 0211 02/02/15 0454 02/03/15 0550  AST 28  --   --  29  --   ALT 28  --   --  19  --   ALKPHOS 64  --   --  71  --   BILITOT 1.5*  --   --  0.9  --   PROT 6.5  --   --  5.9*  --   ALBUMIN 3.9 3.3* 2.9* 3.0*  3.0* 2.9*    Recent Results (from the past 240 hour(s))  MRSA PCR Screening     Status: None   Collection Time: 01/30/15  7:01 PM  Result Value Ref Range Status   MRSA by PCR NEGATIVE NEGATIVE Final    Comment:        The GeneXpert MRSA Assay (FDA approved for NASAL specimens only), is one component of a comprehensive MRSA colonization surveillance program. It is not intended to diagnose MRSA infection nor to guide or monitor treatment for MRSA infections.      Studies:   Recent x-ray studies have been reviewed in detail by the Attending Physician  Scheduled Meds:  Scheduled Meds: . anticoagulant sodium citrate  5 mL Intravenous Once  . antiseptic oral rinse  7 mL Mouth Rinse BID  . atenolol  25 mg Oral QHS  . budesonide-formoterol  2 puff Inhalation BID  . calcium gluconate IVPB  4 g Intravenous Once  . fluticasone  1 spray Each Nare Daily  . sodium chloride  3 mL Intravenous Q12H    Time spent on care of this patient: 35 mins   Laconda Basich T , MD   [facility] Office  [phone] Pager - Text Page per Shea Evans as per below:  On-Call/Text Page:      [email or web address]      password [email or web address]  If 7PM-7AM, please contact night-coverage [email or web address] Password [facility] 02/03/2015, 5:38 PM   LOS: 4 days

## 2015-02-03 NOTE — Progress Notes (Signed)
[place] [phone]   [phone]  CONSULT NOTE  REFERRING PHYSICIAN: Dr. Jamal Maes  REASON FOR CONSULTATION:  81 years old white male with questionable multiple myeloma.  HPI Justin Woods is a 81 y.o. male with past medical history significant for hypertension, COPD, benign prostatic hypertrophy, kidney stone as well as deep venous thrombosis. The patient mentions that over the last few days he has not been feeling well with decreased urine output. He was seen by his primary care physician and trial for catheter insertion failed. He was sent to the emergency department and a Foley's catheter was placed by urology but no significant urine output was obtained. Blood work at the emergency Department showed low hemoglobin of 11.1, hematocrit 31.1% and low platelets count of 113. His serum creatinine was elevated at 12.69. Serum protein electrophoreses showed a faint band in the gamma region suspicious for monoclonal immunoglobulin. Urine protein electrophoresis with immunofixation showed significantly elevated free kappa light chain and confirming the presence of Bence Jones protein. Serum lactate chain showed elevated free kappa light chain of 12,024 mg/L, free lambda light chain was normal at 14.95 with a kappa/lambda ratio of 804.28. The patient was seen by nephrology and started on hemodialysis. His serum creatinine today is still elevated at 12.39. The patient underwent ultrasound guided renal biopsy on 02/02/2015 by interventional radiology. The final biopsy result is still pending. CT of the abdomen and pelvis showed no obstructive uropathy or neurologic calculus. Dr. Harden Mo kindly asked me to see the patient today for evaluation of his IgA light chain nephropathy. The patient was seen at the hemodialysis area today. He is feeling fine with no specific complaints except for fatigue. He was very active before his admission to the hospital and used  to work for apartment maintenance. He denied having any significant nausea or vomiting, no fever or chills. The patient denied having any significant chest pain, shortness of breath, cough or hemoptysis. No significant weight loss or night sweats. Family history is unremarkable for any malignancy. His father and mother had hypertension. The patient is married and has 2 children. He is currently retired from working at [employer] but used to [employer] work. He has no history of smoking, alcohol or drug abuse.  HPI  Past Medical History  Diagnosis Date  . Hypertension   . COPD (chronic obstructive pulmonary disease)   . Asthma   . Chronic kidney disease   . History of kidney stones   . History of hiatal hernia   . Headache     HISTORY OF CLUSTER HEADACHES  . Arthritis   . Complication of anesthesia     TROUBLE WAKING UP  . H/O colonoscopy with polypectomy   . Allergic rhinitis   . DVT (deep venous thrombosis)     RIGHT CALF AFTER A LITHOTRIPSY  . BPH (benign prostatic hypertrophy)   . Osteoarthritis     Past Surgical History  Procedure Laterality Date  . Lithotripsy    . Back surgery    . Arthroscopy right knee    . Cholecystectomy    . Skin cancer removed from ears      Family History  Problem Relation Age of Onset  . Hypertension Mother   . Hypertension Father     Social History History  Substance Use Topics  . Smoking status: Former Smoker -- 2.00 packs/day    Types: Cigarettes  . Smokeless tobacco: Never Used  . Alcohol Use: No  No Known Allergies  Current Facility-Administered Medications  Medication Dose Route Frequency Provider Last Rate Last Dose  . acetaminophen (TYLENOL) tablet 650 mg  650 mg Oral Q6H PRN Albertine Patricia, MD       Or  . acetaminophen (TYLENOL) suppository 650 mg  650 mg Rectal Q6H PRN Albertine Patricia, MD      . antiseptic oral rinse (CPC / CETYLPYRIDINIUM CHLORIDE 0.05%) solution 7 mL  7 mL Mouth Rinse BID Silver Huguenin  Elgergawy, MD   7 mL at 02/02/15 2200  . atenolol (TENORMIN) tablet 25 mg  25 mg Oral QHS Jamal Maes, MD   25 mg at 02/02/15 2122  . bisacodyl (DULCOLAX) suppository 10 mg  10 mg Rectal Daily PRN Albertine Patricia, MD      . budesonide-formoterol (SYMBICORT) 80-4.5 MCG/ACT inhaler 2 puff  2 puff Inhalation BID Albertine Patricia, MD   2 puff at 02/02/15 2037  . fluticasone (FLONASE) 50 MCG/ACT nasal spray 1 spray  1 spray Each Nare Daily Albertine Patricia, MD   1 spray at 02/03/15 1132  . methylPREDNISolone sodium succinate (SOLU-MEDROL) 500 mg in sodium chloride 0.9 % 50 mL IVPB  500 mg Intravenous Q24H Jamal Maes, MD   500 mg at 02/02/15 1019  . ondansetron (ZOFRAN) tablet 4 mg  4 mg Oral Q6H PRN Albertine Patricia, MD       Or  . ondansetron (ZOFRAN) injection 4 mg  4 mg Intravenous Q6H PRN Silver Huguenin Elgergawy, MD      . polyethylene glycol (MIRALAX / GLYCOLAX) packet 17 g  17 g Oral Daily PRN Albertine Patricia, MD      . sodium chloride 0.9 % injection 3 mL  3 mL Intravenous Q12H Albertine Patricia, MD   3 mL at 02/02/15 2123    Review of Systems  Constitutional: positive for fatigue Eyes: negative Ears, nose, mouth, throat, and face: negative Respiratory: negative Cardiovascular: negative Gastrointestinal: negative Genitourinary:negative Integument/breast: negative Hematologic/lymphatic: negative Musculoskeletal:negative Neurological: negative Behavioral/Psych: negative Endocrine: negative Allergic/Immunologic: negative  Physical Exam  XKG:YJEHU, healthy, no distress, well nourished, well developed and anxious SKIN: skin color, texture, turgor are normal, no rashes or significant lesions HEAD: Normocephalic, No masses, lesions, tenderness or abnormalities EYES: normal, PERRLA EARS: External ears normal, Canals clear OROPHARYNX:no exudate, no erythema and lips, buccal mucosa, and tongue normal  NECK: supple, no adenopathy, no JVD LYMPH:  no palpable  lymphadenopathy, no hepatosplenomegaly LUNGS: clear to auscultation , and palpation HEART: regular rate & rhythm, no murmurs and no gallops ABDOMEN:abdomen soft, non-tender, obese, normal bowel sounds and no masses or organomegaly BACK: Back symmetric, no curvature., No CVA tenderness EXTREMITIES:no joint deformities, effusion, or inflammation, no edema  NEURO: alert & oriented x 3 with fluent speech, no focal motor/sensory deficits  PERFORMANCE STATUS: ECOG 1  LABORATORY DATA: Lab Results  Component Value Date   WBC 14.7* 02/03/2015   HGB 10.1* 02/03/2015   HCT 28.4* 02/03/2015   MCV 92.5 02/03/2015   PLT 128* 02/03/2015    _0 @  RADIOGRAPHIC STUDIES: Ct Abdomen Pelvis Wo Contrast  01/30/2015   CLINICAL DATA:  81 year old male with abdominal distention, renal failure, right flank pain, oliguria. Initial encounter.  EXAM: CT ABDOMEN AND PELVIS WITHOUT CONTRAST  TECHNIQUE: Multidetector CT imaging of the abdomen and pelvis was performed following the standard protocol without IV contrast.  COMPARISON:  CT Abdomen and Pelvis 05/29/2014 and earlier.  FINDINGS: New small layering right pleural effusion. Trace layering left  pleural effusion. No pericardial effusion. Septal thickening at both lung bases. Mild dependent atelectasis.  Stable visualized osseous structures. L4 hemivertebra type appearance re- identified.  Stable fat containing left inguinal hernia. No pelvic free fluid. Foley catheter in the bladder which is decompressed. Decompressed distal colon.  Sigmoid diverticulosis with no active inflammation. Occasional diverticula in the more proximal colon without active inflammation. Fluid in the ascending colon. Negative appendix and terminal ileum. No dilated small bowel. Oral contrast in the stomach and proximal small bowel, has not yet reached the distal small bowel. Mildly distended stomach.  No pneumoperitoneum. No free fluid in the abdomen. Noncontrast liver remarkable for  decreased density in keeping with steatosis. Surgically absent gallbladder. Negative non contrast spleen, pancreas and adrenal glands. Aortoiliac calcified atherosclerosis noted. Mild ectasia of the infrarenal aorta is stable.  Negative non contrast right kidney. No right nephrolithiasis, hydronephrosis, or perinephric stranding. Stable left kidney with midpole cyst. No left hydronephrosis or renal calculus.  No hydroureter, but there is mild bilateral periureteral stranding (series 2, images 47-49). This does not continue all the way to the ureterovesical junction. The appearance is nonspecific. There is no lymphadenopathy in the abdomen or pelvis.  IMPRESSION: 1. New small layering pleural effusions and pulmonary septal thickening at the lung bases compatible with a degree of interstitial edema. 2. No obstructive uropathy or urologic calculus. Mild nonspecific bilateral periureteral stranding. Query ascending urinary tract infection. Bladder decompressed by Foley catheter. 3. Otherwise no acute or inflammatory changes in the abdomen or pelvis. Hepatic steatosis. Diverticulosis of the colon.   Electronically Signed   By: Genevie Ann M.D.   On: 01/30/2015 15:43   Dg Chest 2 View  01/30/2015   CLINICAL DATA:  Fatigue.  Shortness of breath.  EXAM: CHEST  2 VIEW  COMPARISON:  11/08/2014.  FINDINGS: Mediastinum and hilar structures are normal. No focal infiltrate. Cardiomegaly with mild pulmonary vascular prominence. Mild interstitial prominence. Small right pleural effusion. No acute bony abnormality.  IMPRESSION: 1. Cardiomegaly with mild pulmonary vascular prominence and interstitial prominence. Mild congestive heart failure cannot be excluded . 2. Small right pleural effusion .   Electronically Signed   By: Marcello Moores  Register   On: 01/30/2015 11:26   Dg Abd 1 View  01/30/2015   CLINICAL DATA:  Abdominal distention.  EXAM: ABDOMEN - 1 VIEW  COMPARISON:  None.  FINDINGS: Cholecystectomy. Soft tissue structures are  unremarkable. Nondilated air-filled loops of small bowel are noted. Colonic gas pattern is unremarkable. No free air. Pelvic calcifications consistent phleboliths. Degenerative changes and scoliosis lumbar spine. Degenerative changes both hips.  IMPRESSION: Multiple air-filled loops of nondilated small bowel. Follow-up abdominal series is suggested to exclude developing small bowel distention.   Electronically Signed   By: Marcello Moores  Register   On: 01/30/2015 11:28   US Biopsy  02/02/2015   INDICATION: Acute renal failure of uncertain etiology. Please perform ultrasound-guided random renal biopsy for tissue diagnostic purposes  EXAM: ULTRASOUND GUIDED RENAL BIOPSY  COMPARISON:  CT abdomen pelvis - 01/30/2015  MEDICATIONS: Fentanyl 25 mcg IV; Versed 1 mg IV  ANESTHESIA/SEDATION: Total Moderate Sedation time  15 minutes  COMPLICATIONS: None immediate  PROCEDURE: Informed written consent was obtained from the patient after a discussion of the risks, benefits and alternatives to treatment. The patient understands and consents the procedure. A timeout was performed prior to the initiation of the procedure.  Ultrasound scanning was performed of the bilateral flanks. The inferior pole of the left kidney was selected for biopsy  due to location and sonographic window. The procedure was planned. The operative site was prepped and draped in the usual sterile fashion. The overlying soft tissues were anesthetized with 1% lidocaine with epinephrine. A 17 gauge core needle biopsy device was advanced into the inferior cortex of the left kidney and 2 core biopsies were obtained under direct ultrasound guidance. Real time pathologic review confirmed adequate tissue acquisition. Images were saved for documentation purposes. The biopsy device was removed and hemostasis was obtained with manual compression. Post procedural scanning was negative for significant post procedural hemorrhage or additional complication. A dressing was placed.  The patient tolerated the procedure well without immediate post procedural complication.  IMPRESSION: Technically successful ultrasound guided left renal biopsy.   Electronically Signed   By: Sandi Mariscal M.D.   On: 02/02/2015 11:46   Dg Chest Port 1 View  01/31/2015   CLINICAL DATA:  Initial encounter for hemodialysis catheter placement.  EXAM: PORTABLE CHEST - 1 VIEW  COMPARISON:  01/30/2015.  FINDINGS: 1142 hrs. Left IJ dialysis catheter tip overlies the left hilum. The tip projects slightly more lateral than typical seen which is probably related to rightward patient rotation. There is pulmonary vascular congestion without overt pulmonary edema. No focal airspace consolidation or pleural effusion. Cardiopericardial silhouette is at upper limits of normal for size. Telemetry leads overlie the chest. No evidence for pneumothorax.  IMPRESSION: 1. No evidence for pneumothorax status post central line placement.   Electronically Signed   By: Misty Stanley M.D.   On: 01/31/2015 12:02   Dg Chest Port 1 View  01/30/2015   CLINICAL DATA:  Dyspnea and high creatinine level  EXAM: PORTABLE CHEST - 1 VIEW  COMPARISON:  01/30/2015  FINDINGS: Pulmonary edema has developed since earlier today. No cardiomegaly when accounting for lower mediastinal fat. Negative aortic and hilar contours. Trace right pleural effusion.  IMPRESSION: Pulmonary edema, new from 11:30 a.m. earlier today.   Electronically Signed   By: Monte Fantasia M.D.   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   PLAN: I had a lengthy discussion with the patient today about his condition and further investigation to confirm his diagnosis and treatment options. I will order several studies including skeletal bone survey as well as a bone marrow biopsy and aspirate for evaluation of multiple myeloma. I also discussed with Dr. Lorrene Reid starting  the patient on plasmapheresis to decrease the burden of the IgA light chain. The patient will continue on hemodialysis for his renal failure. Once we establish the diagnosis, I may consider the patient for treatment of multiple myeloma most likely with subcutaneous Velcade and Decadron.  I will continue to monitor the patient closely with you and see him again once we have new information to discuss. The patient voices understanding of current disease status and treatment options and is in agreement with the current care plan.  All questions were answered. The patient knows to call the clinic with any problems, questions or concerns. We can certainly see the patient much sooner if necessary.  Thank you so much for allowing me to participate in the care of Justin Woods. I will continue to follow up the patient with you and assist in his care.  Disclaimer: This note was dictated with voice recognition software. Similar sounding words can inadvertently be transcribed and may not be corrected upon review.   Vali Capano  K. February 03, 2015, 12:00 PM

## 2015-02-03 NOTE — Progress Notes (Signed)
To hemo for plasma exchange.

## 2015-02-03 NOTE — Procedures (Signed)
TPE #1 for light chain myeloma 1 PV/FFP replacement Tolerating well  Jamal Maes, MD [facility] [phone] Pager 02/03/2015, 5:16 PM

## 2015-02-03 NOTE — Progress Notes (Signed)
TPE 1/1 completed without issue, no s/sx of reaction. Sodium Citrate instilled into cath lumens for dwell. No complications, report called to [place].

## 2015-02-03 NOTE — Progress Notes (Addendum)
[facility] Rounding Note Subjective:  Tolerated percutaneous renal biopsy in IR yesterday A "little pain" over left kidney last night, none today Minimal UOP, no hematuria Says he feels better breathing wise, and that "hands and abd don't feel as tight" Sig new lab results - SPEP with ? faint monoclonal band, markedly elevated kappa free light chains.  Objective Vital signs in last 24 hours: Filed Vitals:   02/03/15 0435 02/03/15 0658 02/03/15 0716 02/03/15 0730  BP: 132/66 152/75 140/75 151/73  Pulse: 60 64 56 62  Temp:  97.5 F (36.4 C)    TempSrc:  Oral    Resp: 16 22    Height:      Weight: 91.5 kg (201 lb 11.5 oz) 90.6 kg (199 lb 11.8 oz)    SpO2: 93% 95%     Weight change:   Intake/Output Summary (Last 24 hours) at 02/03/15 0743 Last data filed at 02/03/15 4193  Gross per 24 hour  Intake      0 ml  Output    425 ml  Net   -425 ml    Physical Exam:  BP 151/73 mmHg  Pulse 62  Temp(Src) 97.5 F (36.4 C) (Oral)  Resp 22  Ht 5\' 6"  (1.676 m)  Wt 90.6 kg (199 lb 11.8 oz)  BMI 32.25 kg/m2  SpO2 95%  Elderly  WM - seen today in the dialysis unit Oriented, awake No JVD Left IJ temp HD cath (6/22) running well Lungs clear S1S2 No S3 Abd protuberant, + BS, no tenderness today, less tight No edema of the lower extremties Minimal light colored urine in foley catheter No asterixus   Recent Labs Lab 01/30/15 1435 01/30/15 2018 01/31/15 0815 02/01/15 0211 02/02/15 0454 02/03/15 0550  NA 146*  --  142 140 137  136 138  K 5.2*  --  5.5* 4.9 4.2  4.2 4.5  CL 107  --  106 101 98*  97* 97*  CO2 19*  --  20* 26 25  24 22   GLUCOSE 100*  --  127* 112* 210*  208* 172*  BUN 76*  --  80* 57* 47*  48* 89*  CREATININE 12.69* 13.09* 14.38* 11.73* 9.49*  9.48* 12.39*  CALCIUM 8.9  --  8.6* 8.5* 8.1*  8.1* 8.6*  PHOS  --   --  8.6* 8.0* 6.5* 9.1*     Recent Labs Lab 01/30/15 1435  02/01/15 0211 02/02/15 0454 02/03/15 0550  AST 28  --   --   29  --   ALT 28  --   --  19  --   ALKPHOS 64  --   --  71  --   BILITOT 1.5*  --   --  0.9  --   PROT 6.5  --   --  5.9*  --   ALBUMIN 3.9  < > 2.9* 3.0*  3.0* 2.9*  < > = values in this interval not displayed.   Recent Labs Lab 01/30/15 1435  01/31/15 0307 02/01/15 0211 02/02/15 0454 02/03/15 0550  WBC 9.4  < > 9.1 8.7 10.5 14.7*  NEUTROABS 5.8  --   --   --   --   --   HGB 11.1*  < > 10.5* 10.2* 10.3* 10.1*  HCT 31.1*  < > 30.2* 29.5* 28.9* 28.4*  MCV 94.0  < > 95.0 94.2 91.7 92.5  PLT 113*  < > 109* 107* 114* 128*  < > = values in this  interval not displayed.  Serologies and Urine Studies Results for BRENON, ANTOSH (MRN [id number]) as of 01/31/2015 08:32  Ref. Range 01/30/2015 13:24  Appearance Latest Ref Range: CLEAR  CLOUDY (A)  Bacteria, UA Latest Ref Range: RARE  FEW (A)  Bilirubin Urine Latest Ref Range: NEGATIVE  NEGATIVE  Color, Urine Latest Ref Range: YELLOW  YELLOW  Glucose Latest Ref Range: NEGATIVE mg/dL NEGATIVE  Hgb urine dipstick Latest Ref Range: NEGATIVE  LARGE (A)  Ketones, ur Latest Ref Range: NEGATIVE mg/dL NEGATIVE  Leukocytes, UA Latest Ref Range: NEGATIVE  SMALL (A)  Nitrite Latest Ref Range: NEGATIVE  NEGATIVE  pH Latest Ref Range: 5.0-8.0  7.5  Protein Latest Ref Range: NEGATIVE mg/dL 100 (A)  RBC / HPF Latest Ref Range: <3 RBC/hpf 21-50  Specific Gravity, Urine Latest Ref Range: 1.005-1.030  1.008  Squamous Epithelial / LPF Latest Ref Range: RARE  RARE  Urobilinogen, UA Latest Ref Range: 0.0-1.0 mg/dL 0.2  WBC, UA Latest Ref Range: <3 WBC/hpf 3-6   ANCA negative, anti GBM negative, ANA negative C3, C4 normal (104, 22) Hep B negative SPEP faint band in gamma region suspicious for monoclonal Ig Kappa/lambda light chain ratio markedly elevated at 804 (kappa 12,024 Lambda 14.95) UPC 5.5  grams proteinura  Studies/Results: US Biopsy  02/02/2015   INDICATION: Acute renal failure of uncertain etiology. Please perform ultrasound-guided random  renal biopsy for tissue diagnostic purposes  EXAM: ULTRASOUND GUIDED RENAL BIOPSY  COMPARISON:  CT abdomen pelvis - 01/30/2015  MEDICATIONS: Fentanyl 25 mcg IV; Versed 1 mg IV  ANESTHESIA/SEDATION: Total Moderate Sedation time  15 minutes  COMPLICATIONS: None immediate  PROCEDURE: Informed written consent was obtained from the patient after a discussion of the risks, benefits and alternatives to treatment. The patient understands and consents the procedure. A timeout was performed prior to the initiation of the procedure.  Ultrasound scanning was performed of the bilateral flanks. The inferior pole of the left kidney was selected for biopsy due to location and sonographic window. The procedure was planned. The operative site was prepped and draped in the usual sterile fashion. The overlying soft tissues were anesthetized with 1% lidocaine with epinephrine. A 17 gauge core needle biopsy device was advanced into the inferior cortex of the left kidney and 2 core biopsies were obtained under direct ultrasound guidance. Real time pathologic review confirmed adequate tissue acquisition. Images were saved for documentation purposes. The biopsy device was removed and hemostasis was obtained with manual compression. Post procedural scanning was negative for significant post procedural hemorrhage or additional complication. A dressing was placed. The patient tolerated the procedure well without immediate post procedural complication.  IMPRESSION: Technically successful ultrasound guided left renal biopsy.   Electronically Signed   By: Sandi Mariscal M.D.   On: 02/02/2015 11:46   Medications:   . antiseptic oral rinse  7 mL Mouth Rinse BID  . atenolol  25 mg Oral QHS  . budesonide-formoterol  2 puff Inhalation BID  . fluticasone  1 spray Each Nare Daily  . methylPREDNISolone (SOLU-MEDROL) injection  500 mg Intravenous Q24H  . sodium chloride  3 mL Intravenous Q12H   Background Active 81 yo WM with fairly benign PMH of  HTN, COPD, arthritis. Baseline creatinine of 1.27 May 2014.  Presented with several days of worsening fatigue, decreased UOP, dyspnea who presents with AKI, creatinine of 12-13, UA showing proteinuria and hematuria, and we were asked to evaluate. Only relevant drug exposures were daily ibuprofen and PPI  1. AKI -  Oliguric. Lab studies most notable for markedly elevated free kappa light chains with Kappa/lambda ratio of over 800 making LC cast nephropathy highest in the DDx. S/p renal biopsy yesterday so will have prelim hopefully by Monday, but will proceed with Oncology Consult to evaluate for myeloma, and get advice re benefit of TPE.  Dialysis again today. No heparin or other anticoagulants for 2 weeks post renal biopsy. Will discontinue high dose steroids.  2. Anemia -TSat 40%. Hb just over 10. No iron or ESA need just yet. 3. COPD 4. Arthritis - stopped NSAIDS on admission 5. HTN - BP soft. Stopped amlodipine. Changed atenolol to hs   Jamal Maes, MD [facility] [phone] pager 02/03/2015, 7:43 AM   Spoke with Dr. Earlie Server who will see patient, and who agrees with this light chain load that pt may benefit from TPE Orders written, 1 plasma volume, replace with FFP, daily for 7 days  Jamal Maes, MD [facility] Pager 02/03/2015, 8:15 AM

## 2015-02-04 DIAGNOSIS — C9000 Multiple myeloma not having achieved remission: Secondary | ICD-10-CM | POA: Diagnosis present

## 2015-02-04 LAB — THERAPEUTIC PLASMA EXCHANGE (BLOOD BANK)
PLASMA VOLUME NEEDED: 3250
UNIT DIVISION: 0
UNIT DIVISION: 0
UNIT DIVISION: 0
UNIT DIVISION: 0
UNIT DIVISION: 0
UNIT DIVISION: 0
UNIT DIVISION: 0
UNIT DIVISION: 0
UNIT DIVISION: 0
Unit division: 0
Unit division: 0

## 2015-02-04 LAB — CBC
HCT: 28.9 % — ABNORMAL LOW (ref 39.0–52.0)
Hemoglobin: 10.3 g/dL — ABNORMAL LOW (ref 13.0–17.0)
MCH: 32.7 pg (ref 26.0–34.0)
MCHC: 35.6 g/dL (ref 30.0–36.0)
MCV: 91.7 fL (ref 78.0–100.0)
Platelets: 99 10*3/uL — ABNORMAL LOW (ref 150–400)
RBC: 3.15 MIL/uL — ABNORMAL LOW (ref 4.22–5.81)
RDW: 13.4 % (ref 11.5–15.5)
WBC: 12.2 10*3/uL — AB (ref 4.0–10.5)

## 2015-02-04 LAB — RENAL FUNCTION PANEL
Albumin: 2.9 g/dL — ABNORMAL LOW (ref 3.5–5.0)
Anion gap: 14 (ref 5–15)
BUN: 63 mg/dL — ABNORMAL HIGH (ref 6–20)
CHLORIDE: 97 mmol/L — AB (ref 101–111)
CO2: 27 mmol/L (ref 22–32)
Calcium: 8.7 mg/dL — ABNORMAL LOW (ref 8.9–10.3)
Creatinine, Ser: 8.17 mg/dL — ABNORMAL HIGH (ref 0.61–1.24)
GFR calc Af Amer: 6 mL/min — ABNORMAL LOW (ref >60)
GFR, EST NON AFRICAN AMERICAN: 5 mL/min — AB (ref >60)
Glucose, Bld: 167 mg/dL — ABNORMAL HIGH (ref 65–99)
Phosphorus: 9.6 mg/dL — ABNORMAL HIGH (ref 2.5–4.6)
Potassium: 4.4 mmol/L (ref 3.5–5.1)
SODIUM: 138 mmol/L (ref 135–145)

## 2015-02-04 LAB — POCT I-STAT, CHEM 8
BUN: 78 mg/dL — AB (ref 6–20)
CALCIUM ION: 1.06 mmol/L — AB (ref 1.13–1.30)
CREATININE: 10 mg/dL — AB (ref 0.61–1.24)
Chloride: 95 mmol/L — ABNORMAL LOW (ref 101–111)
GLUCOSE: 155 mg/dL — AB (ref 65–99)
HCT: 28 % — ABNORMAL LOW (ref 39.0–52.0)
HEMOGLOBIN: 9.5 g/dL — AB (ref 13.0–17.0)
Potassium: 4.2 mmol/L (ref 3.5–5.1)
SODIUM: 137 mmol/L (ref 135–145)
TCO2: 24 mmol/L (ref 0–100)

## 2015-02-04 MED ORDER — ACETAMINOPHEN 325 MG PO TABS: 650.0000 mg | ORAL_TABLET | ORAL | Status: DC | PRN

## 2015-02-04 MED ORDER — CALCIUM CARBONATE ANTACID 500 MG PO CHEW
2.0000 | CHEWABLE_TABLET | ORAL | Status: AC
  Administered 2015-02-04: 400 mg via ORAL
  Filled 2015-02-04 (×2): qty 2

## 2015-02-04 MED ORDER — ACD FORMULA A 0.73-2.45-2.2 GM/100ML VI SOLN
Status: AC
  Administered 2015-02-04: 13:00:00
  Filled 2015-02-04: qty 1000

## 2015-02-04 MED ORDER — PROMETHAZINE HCL 25 MG/ML IJ SOLN: 6.2500 mg | Freq: Four times a day (QID) | INTRAMUSCULAR | Status: DC | PRN

## 2015-02-04 MED ORDER — CALCIUM CARBONATE ANTACID 500 MG PO CHEW
CHEWABLE_TABLET | ORAL | Status: AC
  Filled 2015-02-04: qty 2

## 2015-02-04 MED ORDER — ACD FORMULA A 0.73-2.45-2.2 GM/100ML VI SOLN
500.0000 mL | Status: DC
  Filled 2015-02-04: qty 500

## 2015-02-04 MED ORDER — CALCIUM GLUCONATE 10 % IV SOLN
4.0000 g | Freq: Once | INTRAVENOUS | Status: AC
  Administered 2015-02-04: 4 g via INTRAVENOUS
  Filled 2015-02-04 (×2): qty 40

## 2015-02-04 MED ORDER — ANTICOAGULANT SODIUM CITRATE 4% (200MG/5ML) IV SOLN
5.0000 mL | Freq: Once | Status: AC
  Administered 2015-02-04: 5 mL
  Filled 2015-02-04: qty 250

## 2015-02-04 MED ORDER — SODIUM CHLORIDE 0.9 % IJ SOLN
10.0000 mL | INTRAMUSCULAR | Status: DC | PRN
  Administered 2015-02-05: 10 mL
  Filled 2015-02-04: qty 40

## 2015-02-04 MED ORDER — DIPHENHYDRAMINE HCL 25 MG PO CAPS: 25.0000 mg | ORAL_CAPSULE | Freq: Four times a day (QID) | ORAL | Status: DC | PRN

## 2015-02-04 NOTE — Progress Notes (Addendum)
[facility] Rounding Note Subjective:  S/p renal bx 6/24 IR S/p HD #3 yesterday 6/25 S/P TPE #1 of 7 scheduled daily  for markedly elevated free kappa light chains/probable myeloma  Tired - was long day yesterday Says he "just doesn't understand all that is happening to him" I have spoken at length with him, his daughter and his wife  UOP 100 cc/24 hours   Objective Vital signs in last 24 hours: Filed Vitals:   02/04/15 0347 02/04/15 0400 02/04/15 0749 02/04/15 0824  BP: 112/46 122/50  157/65  Pulse: 54   57  Temp: 97.8 F (36.6 C)   97.8 F (36.6 C)  TempSrc: Oral   Oral  Resp: _0 Height:      Weight:      SpO2:   95% 94%   Weight change: -3 kg (-6 lb 9.8 oz)  Intake/Output Summary (Last 24 hours) at 02/04/15 0909 Last data filed at 02/04/15 0348  Gross per 24 hour  Intake    240 ml  Output   2100 ml  Net  -1860 ml    Physical Exam:  BP 157/65 mmHg  Pulse 57  Temp(Src) 97.8 F (36.6 C) (Oral)  Resp 21  Ht _1  (1.676 m)  Wt 90.9 kg (200 lb 6.4 oz)  BMI 32.36 kg/m2  SpO2 94%  Elderly  WM  Oriented, awake No JVD Left IJ temp HD cath (6/22)  Lungs clear S1S2 No S3 Abd protuberant, + BS, no tenderness today No edema of the lower extremties Minimal light colored urine in foley catheter No asterixus   Recent Labs Lab 01/30/15 1435 01/30/15 2018 01/31/15 0815 02/01/15 0211 02/02/15 0454 02/03/15 0550 02/03/15 1606 02/04/15 0455  NA 146*  --  142 140 137  136 138 137 138  K 5.2*  --  5.5* 4.9 4.2  4.2 4.5 3.9 4.4  CL 107  --  106 101 98*  97* 97* 98* 97*  CO2 19*  --  20* _2 --  27  GLUCOSE 100*  --  127* 112* 210*  208* 172* 200* 167*  BUN 76*  --  80* 57* 47*  48* 89* 42* 63*  CREATININE 12.69* 13.09* 14.38* 11.73* 9.49*  9.48* 12.39* 7.50* 8.17*  CALCIUM 8.9  --  8.6* 8.5* 8.1*  8.1* 8.6*  --  8.7*  PHOS  --   --  8.6* 8.0* 6.5* 9.1*  --  9.6*     Recent Labs Lab 01/30/15 1435  02/02/15 0454  02/03/15 0550 02/04/15 0455  AST 28  --  29  --   --   ALT 28  --  19  --   --   ALKPHOS 64  --  71  --   --   BILITOT 1.5*  --  0.9  --   --   PROT 6.5  --  5.9*  --   --   ALBUMIN 3.9  < > 3.0*  3.0* 2.9* 2.9*  < > = values in this interval not displayed.   Recent Labs Lab 01/30/15 1435  02/01/15 0211 02/02/15 0454 02/03/15 0550 02/03/15 1606 02/04/15 0455  WBC 9.4  < > 8.7 10.5 14.7*  --  12.2*  NEUTROABS 5.8  --   --   --   --   --   --   HGB 11.1*  < > 10.2* 10.3* 10.1* 11.2* 10.3*  HCT 31.1*  < >  29.5* 28.9* 28.4* 33.0* 28.9*  MCV 94.0  < > 94.2 91.7 92.5  --  91.7  PLT 113*  < > 107* 114* 128*  --  99*  < > = values in this interval not displayed.  Serologies and Urine Studies Results for Justin Woods, Justin Woods (MRN [id number]) as of 01/31/2015 08:32  Ref. Range 01/30/2015 13:24  Appearance Latest Ref Range: CLEAR  CLOUDY (A)  Bacteria, UA Latest Ref Range: RARE  FEW (A)  Bilirubin Urine Latest Ref Range: NEGATIVE  NEGATIVE  Color, Urine Latest Ref Range: YELLOW  YELLOW  Glucose Latest Ref Range: NEGATIVE mg/dL NEGATIVE  Hgb urine dipstick Latest Ref Range: NEGATIVE  LARGE (A)  Ketones, ur Latest Ref Range: NEGATIVE mg/dL NEGATIVE  Leukocytes, UA Latest Ref Range: NEGATIVE  SMALL (A)  Nitrite Latest Ref Range: NEGATIVE  NEGATIVE  pH Latest Ref Range: 5.0-8.0  7.5  Protein Latest Ref Range: NEGATIVE mg/dL 100 (A)  RBC / HPF Latest Ref Range: <3 RBC/hpf 21-50  Specific Gravity, Urine Latest Ref Range: 1.005-1.030  1.008  Squamous Epithelial / LPF Latest Ref Range: RARE  RARE  Urobilinogen, UA Latest Ref Range: 0.0-1.0 mg/dL 0.2  WBC, UA Latest Ref Range: <3 WBC/hpf 3-6   ANCA negative, anti GBM negative, ANA negative C3, C4 normal (104, 22) Hep B negative SPEP faint band in gamma region suspicious for monoclonal Ig Kappa/lambda light chain ratio markedly elevated at 804 (kappa 12,024 Lambda 14.95) UPC 5.5  grams proteinura  Studies/Results: US Biopsy  02/02/2015    INDICATION: Acute renal failure of uncertain etiology. Please perform ultrasound-guided random renal biopsy for tissue diagnostic purposes  EXAM: ULTRASOUND GUIDED RENAL BIOPSY  COMPARISON:  CT abdomen pelvis - 01/30/2015  MEDICATIONS: Fentanyl 25 mcg IV; Versed 1 mg IV  ANESTHESIA/SEDATION: Total Moderate Sedation time  15 minutes  COMPLICATIONS: None immediate  PROCEDURE: Informed written consent was obtained from the patient after a discussion of the risks, benefits and alternatives to treatment. The patient understands and consents the procedure. A timeout was performed prior to the initiation of the procedure.  Ultrasound scanning was performed of the bilateral flanks. The inferior pole of the left kidney was selected for biopsy due to location and sonographic window. The procedure was planned. The operative site was prepped and draped in the usual sterile fashion. The overlying soft tissues were anesthetized with 1% lidocaine with epinephrine. A 17 gauge core needle biopsy device was advanced into the inferior cortex of the left kidney and 2 core biopsies were obtained under direct ultrasound guidance. Real time pathologic review confirmed adequate tissue acquisition. Images were saved for documentation purposes. The biopsy device was removed and hemostasis was obtained with manual compression. Post procedural scanning was negative for significant post procedural hemorrhage or additional complication. A dressing was placed. The patient tolerated the procedure well without immediate post procedural complication.  IMPRESSION: Technically successful ultrasound guided left renal biopsy.   Electronically Signed   By: Sandi Mariscal M.D.   On: 02/02/2015 11:46   Dg Bone Survey Met  02/03/2015   CLINICAL DATA:  Multiple myeloma  EXAM: METASTATIC BONE SURVEY  COMPARISON:  Multiple exams, including 01/30/2015  FINDINGS: No punched out lucent cortical lesions characteristic of multiple myeloma identified.  Scattered  degenerative findings. Aortoiliac atherosclerotic vascular disease. Left IJ line tip: SVC.  IMPRESSION: 1. No lucent lesions characteristic of multiple myeloma are identified.   Electronically Signed   By: Van Clines M.D.   On: 02/03/2015 21:30  Medications: . citrate dextrose     . antiseptic oral rinse  7 mL Mouth Rinse BID  . atenolol  25 mg Oral QHS  . budesonide-formoterol  2 puff Inhalation BID  . fluticasone  1 spray Each Nare Daily  . heparin  1,000 Units Intracatheter Once  . sodium chloride  3 mL Intravenous Q12H   Background Active 81 yo WM with fairly benign PMH of HTN, COPD, arthritis. Baseline creatinine of 1.27 May 2014.  Presented with several days of worsening fatigue, decreased UOP, dyspnea who presents with AKI, creatinine of 12-13, UA showing proteinuria and hematuria, and we were asked to evaluate. Only relevant drug exposures were daily ibuprofen and PPI. Dialysis initiated 01/31/15 via temp cath. Renal biopsy done 6/24. Myeloma studies returned with free kappa LV's >12,000, ratio >800 and TPE initiated 02/03/15. Oncology evaluating.  1. AKI - Oliguric AKI . HD initiated 6/22. Euvolemic now at weight of about 90 kg.  Renal bx result pending from 6/24. 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 TPE initiated 6/25. Oncology plans BM Bx 6/27.  No heparin or other anticoagulants for 2 weeks post renal biopsy. Next TPE today. Would be due for TPE, dialysis  AND BM tomorrow - may be a little much procedure wise for 1 day. Will check labs in the AM and if potassium ON, defer next HD to Tuesday. Will need temp cath changed to tunnelled this week and VVS evaluation for permanent access (will call them tomorrow but have ordered vein mapping) If this is severe LC cast nephropathy as suspected, will very likely be ESRD.Marland KitchenMarland KitchenWill D/C foley as minimal UOP. 2. Anemia -TSat  40%. Hb just over 10. No iron or ESA need just yet. 3. COPD 4. Arthritis - stopped NSAIDS on admission 5. HTN -  Atenolol to hs   Jamal Maes, MD [facility] [phone] pager 02/04/2015, 9:09 AM

## 2015-02-04 NOTE — Consult Note (Signed)
Chief Complaint: Chief Complaint  Patient presents with  . high creatinine   Monoclonal immunoglobulin  Referring Physician(s): Dr Arbutus Ped  History of Present Illness: Justin Woods is a 81 y.o. male   Fatigue Monoclonal immunoglobulin Suspicious for multiple myeloma Request for bone marrow bx per Dr Arbutus Ped Renal bx 6/24: pending Acute renal failure  Past Medical History  Diagnosis Date  . Hypertension   . COPD (chronic obstructive pulmonary disease)   . Asthma   . Chronic kidney disease   . History of kidney stones   . History of hiatal hernia   . Headache     HISTORY OF CLUSTER HEADACHES  . Arthritis   . Complication of anesthesia     TROUBLE WAKING UP  . H/O colonoscopy with polypectomy   . Allergic rhinitis   . DVT (deep venous thrombosis)     RIGHT CALF AFTER A LITHOTRIPSY  . BPH (benign prostatic hypertrophy)   . Osteoarthritis     Past Surgical History  Procedure Laterality Date  . Lithotripsy    . Back surgery    . Arthroscopy right knee    . Cholecystectomy    . 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  Medications: Prior to Admission medications   Medication Sig Start Date End Date Taking? Authorizing Provider  amLODipine (NORVASC) 5 MG tablet Take 5 mg by mouth daily.   Yes Historical Provider, MD  atenolol (TENORMIN) 50 MG tablet Take 25 mg by mouth daily.   Yes Historical Provider, MD  budesonide-formoterol (SYMBICORT) 80-4.5 MCG/ACT inhaler Inhale 2 puffs into the lungs 2 (two) times daily.   Yes Historical Provider, MD  Cholecalciferol (VITAMIN D PO) Take 1 tablet by mouth daily.   Yes Historical Provider, MD  fluticasone (FLONASE) 50 MCG/ACT nasal spray Place 1 spray into both nostrils daily.   Yes Historical Provider, MD  hydrochlorothiazide (HYDRODIURIL) 25 MG tablet Take 12.5 mg by mouth daily.   Yes Historical Provider, MD  omeprazole (PRILOSEC) 40 MG capsule  Take 40 mg by mouth daily.   Yes Historical Provider, MD     Family History  Problem Relation Age of Onset  . Hypertension Mother   . Hypertension Father     History   Social History  . Marital Status: Married    Spouse Name: N/A  . Number of Children: N/A  . Years of Education: N/A   Social History Main Topics  . Smoking status: Former Smoker -- 2.00 packs/day    Types: Cigarettes  . Smokeless tobacco: Never Used  . Alcohol Use: No  . Drug Use: No  . Sexual Activity: No   Other Topics Concern  . None   Social History Narrative     Review of Systems: A 12 point ROS discussed and pertinent positives are indicated in the HPI above.  All other systems are negative.  Review of Systems  Constitutional: Positive for fatigue.  Respiratory: Negative for shortness of breath.   Neurological: Positive for weakness.    Vital Signs: BP 122/50 mmHg  Pulse 54  Temp(Src) 97.8 F (36.6 C) (Oral)  Resp 22  Ht 5\' 6"  (1.676 m)  Wt 200 lb 6.4 oz (90.9 kg)  BMI 32.36 kg/m2  SpO2 95%  Physical Exam  Constitutional: He is oriented to person, place, and time. He appears well-nourished.  Cardiovascular: Normal rate and regular rhythm.   No murmur heard. Pulmonary/Chest: Effort normal  and breath sounds normal. He has no wheezes.  Abdominal: Soft. Bowel sounds are normal. There is no tenderness.  Musculoskeletal: Normal range of motion.  Neurological: He is alert and oriented to person, place, and time.  Skin: Skin is warm and dry.  Psychiatric: He has a normal mood and affect. His behavior is normal. Judgment and thought content normal.  Nursing note and vitals reviewed.   Mallampati Score:  MD Evaluation Airway: WNL Heart: WNL Abdomen: WNL Chest/ Lungs: WNL ASA  Classification: 3 Mallampati/Airway Score: One  Imaging: Ct Abdomen Pelvis Wo Contrast  01/30/2015   CLINICAL DATA:  81 year old male with abdominal distention, renal failure, right flank pain, oliguria.  Initial encounter.  EXAM: CT ABDOMEN AND PELVIS WITHOUT CONTRAST  TECHNIQUE: Multidetector CT imaging of the abdomen and pelvis was performed following the standard protocol without IV contrast.  COMPARISON:  CT Abdomen and Pelvis 05/29/2014 and earlier.  FINDINGS: New small layering right pleural effusion. Trace layering left pleural effusion. No pericardial effusion. Septal thickening at both lung bases. Mild dependent atelectasis.  Stable visualized osseous structures. L4 hemivertebra type appearance re- identified.  Stable fat containing left inguinal hernia. No pelvic free fluid. Foley catheter in the bladder which is decompressed. Decompressed distal colon.  Sigmoid diverticulosis with no active inflammation. Occasional diverticula in the more proximal colon without active inflammation. Fluid in the ascending colon. Negative appendix and terminal ileum. No dilated small bowel. Oral contrast in the stomach and proximal small bowel, has not yet reached the distal small bowel. Mildly distended stomach.  No pneumoperitoneum. No free fluid in the abdomen. Noncontrast liver remarkable for decreased density in keeping with steatosis. Surgically absent gallbladder. Negative non contrast spleen, pancreas and adrenal glands. Aortoiliac calcified atherosclerosis noted. Mild ectasia of the infrarenal aorta is stable.  Negative non contrast right kidney. No right nephrolithiasis, hydronephrosis, or perinephric stranding. Stable left kidney with midpole cyst. No left hydronephrosis or renal calculus.  No hydroureter, but there is mild bilateral periureteral stranding (series 2, images 47-49). This does not continue all the way to the ureterovesical junction. The appearance is nonspecific. There is no lymphadenopathy in the abdomen or pelvis.  IMPRESSION: 1. New small layering pleural effusions and pulmonary septal thickening at the lung bases compatible with a degree of interstitial edema. 2. No obstructive uropathy or  urologic calculus. Mild nonspecific bilateral periureteral stranding. Query ascending urinary tract infection. Bladder decompressed by Foley catheter. 3. Otherwise no acute or inflammatory changes in the abdomen or pelvis. Hepatic steatosis. Diverticulosis of the colon.   Electronically Signed   By: Genevie Ann M.D.   On: 01/30/2015 15:43   Dg Chest 2 View  01/30/2015   CLINICAL DATA:  Fatigue.  Shortness of breath.  EXAM: CHEST  2 VIEW  COMPARISON:  11/08/2014.  FINDINGS: Mediastinum and hilar structures are normal. No focal infiltrate. Cardiomegaly with mild pulmonary vascular prominence. Mild interstitial prominence. Small right pleural effusion. No acute bony abnormality.  IMPRESSION: 1. Cardiomegaly with mild pulmonary vascular prominence and interstitial prominence. Mild congestive heart failure cannot be excluded . 2. Small right pleural effusion .   Electronically Signed   By: Marcello Moores  Register   On: 01/30/2015 11:26   Dg Abd 1 View  01/30/2015   CLINICAL DATA:  Abdominal distention.  EXAM: ABDOMEN - 1 VIEW  COMPARISON:  None.  FINDINGS: Cholecystectomy. Soft tissue structures are unremarkable. Nondilated air-filled loops of small bowel are noted. Colonic gas pattern is unremarkable. No free air. Pelvic calcifications consistent phleboliths.  Degenerative changes and scoliosis lumbar spine. Degenerative changes both hips.  IMPRESSION: Multiple air-filled loops of nondilated small bowel. Follow-up abdominal series is suggested to exclude developing small bowel distention.   Electronically Signed   By: Marcello Moores  Register   On: 01/30/2015 11:28   US Biopsy  02/02/2015   INDICATION: Acute renal failure of uncertain etiology. Please perform ultrasound-guided random renal biopsy for tissue diagnostic purposes  EXAM: ULTRASOUND GUIDED RENAL BIOPSY  COMPARISON:  CT abdomen pelvis - 01/30/2015  MEDICATIONS: Fentanyl 25 mcg IV; Versed 1 mg IV  ANESTHESIA/SEDATION: Total Moderate Sedation time  15 minutes   COMPLICATIONS: None immediate  PROCEDURE: Informed written consent was obtained from the patient after a discussion of the risks, benefits and alternatives to treatment. The patient understands and consents the procedure. A timeout was performed prior to the initiation of the procedure.  Ultrasound scanning was performed of the bilateral flanks. The inferior pole of the left kidney was selected for biopsy due to location and sonographic window. The procedure was planned. The operative site was prepped and draped in the usual sterile fashion. The overlying soft tissues were anesthetized with 1% lidocaine with epinephrine. A 17 gauge core needle biopsy device was advanced into the inferior cortex of the left kidney and 2 core biopsies were obtained under direct ultrasound guidance. Real time pathologic review confirmed adequate tissue acquisition. Images were saved for documentation purposes. The biopsy device was removed and hemostasis was obtained with manual compression. Post procedural scanning was negative for significant post procedural hemorrhage or additional complication. A dressing was placed. The patient tolerated the procedure well without immediate post procedural complication.  IMPRESSION: Technically successful ultrasound guided left renal biopsy.   Electronically Signed   By: Sandi Mariscal M.D.   On: 02/02/2015 11:46   Dg Chest Port 1 View  01/31/2015   CLINICAL DATA:  Initial encounter for hemodialysis catheter placement.  EXAM: PORTABLE CHEST - 1 VIEW  COMPARISON:  01/30/2015.  FINDINGS: 1142 hrs. Left IJ dialysis catheter tip overlies the left hilum. The tip projects slightly more lateral than typical seen which is probably related to rightward patient rotation. There is pulmonary vascular congestion without overt pulmonary edema. No focal airspace consolidation or pleural effusion. Cardiopericardial silhouette is at upper limits of normal for size. Telemetry leads overlie the chest. No evidence  for pneumothorax.  IMPRESSION: 1. No evidence for pneumothorax status post central line placement.   Electronically Signed   By: Misty Stanley M.D.   On: 01/31/2015 12:02   Dg Chest Port 1 View  01/30/2015   CLINICAL DATA:  Dyspnea and high creatinine level  EXAM: PORTABLE CHEST - 1 VIEW  COMPARISON:  01/30/2015  FINDINGS: Pulmonary edema has developed since earlier today. No cardiomegaly when accounting for lower mediastinal fat. Negative aortic and hilar contours. Trace right pleural effusion.  IMPRESSION: Pulmonary edema, new from 11:30 a.m. earlier today.   Electronically Signed   By: Monte Fantasia M.D.   On: 01/30/2015 17:14   Dg Bone Survey Met  02/03/2015   CLINICAL DATA:  Multiple myeloma  EXAM: METASTATIC BONE SURVEY  COMPARISON:  Multiple exams, including 01/30/2015  FINDINGS: No punched out lucent cortical lesions characteristic of multiple myeloma identified.  Scattered degenerative findings. Aortoiliac atherosclerotic vascular disease. Left IJ line tip: SVC.  IMPRESSION: 1. No lucent lesions characteristic of multiple myeloma are identified.   Electronically Signed   By: Van Clines M.D.   On: 02/03/2015 21:30    Labs:  CBC:  Recent Labs  02/01/15 0211 02/02/15 0454 02/03/15 0550 02/03/15 1606 02/04/15 0455  WBC 8.7 10.5 14.7*  --  12.2*  HGB 10.2* 10.3* 10.1* 11.2* 10.3*  HCT 29.5* 28.9* 28.4* 33.0* 28.9*  PLT 107* 114* 128*  --  99*    COAGS:  Recent Labs  02/02/15 0454  INR 1.11    BMP:  Recent Labs  02/01/15 0211 02/02/15 0454 02/03/15 0550 02/03/15 1606 02/04/15 0455  NA 140 137  136 138 137 138  K 4.9 4.2  4.2 4.5 3.9 4.4  CL 101 98*  97* 97* 98* 97*  CO2 $Re'26 25  24 22  'XEA$ --  27  GLUCOSE 112* 210*  208* 172* 200* 167*  BUN 57* 47*  48* 89* 42* 63*  CALCIUM 8.5* 8.1*  8.1* 8.6*  --  8.7*  CREATININE 11.73* 9.49*  9.48* 12.39* 7.50* 8.17*  GFRNONAA 3* 4*  4* 3*  --  5*  GFRAA 4* 5*  5* 4*  --  6*    LIVER FUNCTION  TESTS:  Recent Labs  01/30/15 1435  02/01/15 0211 02/02/15 0454 02/03/15 0550 02/04/15 0455  BILITOT 1.5*  --   --  0.9  --   --   AST 28  --   --  29  --   --   ALT 28  --   --  19  --   --   ALKPHOS 64  --   --  71  --   --   PROT 6.5  --   --  5.9*  --   --   ALBUMIN 3.9  < > 2.9* 3.0*  3.0* 2.9* 2.9*  < > = values in this interval not displayed.  TUMOR MARKERS: No results for input(s): AFPTM, CEA, CA199, CHROMGRNA in the last 8760 hours.  Assessment and Plan:  Extreme fatigue Acute renal failure Blood works suspicious for MM Now scheduled for Bone marrow bx Scheduled for 6/27 in Rad Risks and Benefits discussed with the patient including, but not limited to bleeding, infection, damage to adjacent structures or low yield requiring additional tests. All of the patient's questions were answered, patient is agreeable to proceed. Consent signed and in chart.   Thank you for this interesting consult.  I greatly enjoyed meeting YURIEL LOPEZMARTINEZ and look forward to participating in their care.  Signed: Kierrah Kilbride A 02/04/2015, 8:18 AM   I spent a total of 20 Minutes    in face to face in clinical consultation, greater than 50% of which was counseling/coordinating care for BM bx

## 2015-02-04 NOTE — Progress Notes (Signed)
[place] TEAM 1 - Stepdown/ICU TEAM Progress Note  SHYAM DAWSON ZOX:[id number] DOB: 01/28/1931 DOA: 01/30/2015 PCP: Wenda Low, MD  Admit HPI / Brief Narrative: 81yo male with history of HTN and renal stones who went to see his PCP c/o fatigue with decreased urine output.  Labs noted creatinine of 12.6, w/ baseline at PCP office in October 2015 of 1.17.  CT abdomen and pelvis did not show evidence of renal stones or hydronephrosis, only mild pre-ureteral stranding.  Patient was seen by Urology Dr.Herrick and had Foley catheter inserted with minimal urine output (total 150 mL).  HPI/Subjective: The patient is seen in his room on plasmapheresis.  He complains of unrelenting nausea when he is on dialysis or pheresis.  He denies chest pain shortness of breath or abdominal pain.  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 - poor response to diuretic - Nephrology directing HD - renal bx 6/24 - SPEP noted elevated kappa free light chains prompting Heme consult - undergoing plasmapheresis daily to complete 7 days of treatment  Suspected MM Heme now following - for BMbx per IR Monday   Hyperkalemia Due to acute renal failure - corrected with hemodialysis  Acute hypoxic respiratory failure - pulmonary edema Most consistent with pulmonary edema related to acute renal failure - resolved with ongoing dialysis  Anemia Likely related to kidney failure + MM - no indication for epo or iron at this time  Hypertension Blood pressure reasonably controlled at present time - follow without change in treatment plan   Prior history of DVT during hospitalization Unable to use anticoagulation of any type for 2 weeks post renal biopsy  COPD No evidence of acute exacerbation at present time  Chronic arthritis Denies focal joint pain at this time  Code Status: FULL Family Communication: No family present at time of  exam today Disposition Plan: SDU  Consultants: Nephrology  Hematology/Oncology  Procedures: 6/22 insertion L IJ temporary hemodialysis catheter 6/22 HD initiated  6/24 kidney bx 6/25 plasmapheresis initiated  Antibiotics: None   DVT prophylaxis: SCDs (post renal bx)  Objective: Blood pressure 145/64, pulse 64, temperature 98.3 F (36.8 C), temperature source Oral, resp. rate 18, height 5\' 6"  (1.676 m), weight 91.7 kg (202 lb 2.6 oz), SpO2 93 %.  Intake/Output Summary (Last 24 hours) at 02/04/15 1327 Last data filed at 02/04/15 0348  Gross per 24 hour  Intake    240 ml  Output    100 ml  Net    140 ml   Exam: General: Respirations appear comfortable - alert and conversant Lungs: No appreciable crackles or wheezes - good air movement th/o  Cardiovascular: Regular rate and rhythm without gallop or rub Abdomen: Nontender, mildly protuberant, soft, bowel sounds positive, no rebound, no ascites, no mass Extremities: No significant cyanosis, or clubbing, 1+ edema bilateral lower extremities  Data Reviewed: Basic Metabolic Panel:  Recent Labs Lab 01/31/15 0815 02/01/15 0211 02/02/15 0454 02/03/15 0550 02/03/15 1606 02/04/15 0455  NA 142 140 137  136 138 137 138  K 5.5* 4.9 4.2  4.2 4.5 3.9 4.4  CL 106 101 98*  97* 97* 98* 97*  CO2 20* 26 25  24 22   --  27  GLUCOSE 127* 112* 210*  208* 172* 200* 167*  BUN 80* 57* 47*  48* 89* 42* 63*  CREATININE 14.38* 11.73* 9.49*  9.48* 12.39* 7.50* 8.17*  CALCIUM 8.6* 8.5* 8.1*  8.1* 8.6*  --  8.7*  MG  --   --  2.1  --   --   --   PHOS 8.6* 8.0* 6.5* 9.1*  --  9.6*    CBC:  Recent Labs Lab 01/30/15 1435  01/31/15 0307 02/01/15 0211 02/02/15 0454 02/03/15 0550 02/03/15 1606 02/04/15 0455  WBC 9.4  < > 9.1 8.7 10.5 14.7*  --  12.2*  NEUTROABS 5.8  --   --   --   --   --   --   --   HGB 11.1*  < > 10.5* 10.2* 10.3* 10.1* 11.2* 10.3*  HCT 31.1*  < > 30.2* 29.5* 28.9* 28.4* 33.0* 28.9*  MCV 94.0  < > 95.0 94.2  91.7 92.5  --  91.7  PLT 113*  < > 109* 107* 114* 128*  --  99*  < > = values in this interval not displayed.  Liver Function Tests:  Recent Labs Lab 01/30/15 1435 01/31/15 0815 02/01/15 0211 02/02/15 0454 02/03/15 0550 02/04/15 0455  AST 28  --   --  29  --   --   ALT 28  --   --  19  --   --   ALKPHOS 64  --   --  71  --   --   BILITOT 1.5*  --   --  0.9  --   --   PROT 6.5  --   --  5.9*  --   --   ALBUMIN 3.9 3.3* 2.9* 3.0*  3.0* 2.9* 2.9*    Recent Results (from the past 240 hour(s))  MRSA PCR Screening     Status: None   Collection Time: 01/30/15  7:01 PM  Result Value Ref Range Status   MRSA by PCR NEGATIVE NEGATIVE Final    Comment:        The GeneXpert MRSA Assay (FDA approved for NASAL specimens only), is one component of a comprehensive MRSA colonization surveillance program. It is not intended to diagnose MRSA infection nor to guide or monitor treatment for MRSA infections.      Studies:   Recent x-ray studies have been reviewed in detail by the Attending Physician  Scheduled Meds:  Scheduled Meds: . anticoagulant sodium citrate  5 mL Intracatheter Once  . antiseptic oral rinse  7 mL Mouth Rinse BID  . atenolol  25 mg Oral QHS  . budesonide-formoterol  2 puff Inhalation BID  . calcium carbonate  2 tablet Oral Q3H  . calcium gluconate IVPB  4 g Intravenous Once  . fluticasone  1 spray Each Nare Daily  . heparin  1,000 Units Intracatheter Once  . sodium chloride  3 mL Intravenous Q12H    Time spent on care of this patient: 25 mins   MCCLUNG,JEFFREY T , MD   [facility] Office  [phone] Pager - Text Page per Shea Evans as per below:  On-Call/Text Page:      [email or web address]      password [email or web address]  If 7PM-7AM, please contact night-coverage [email or web address] Password [email or web address] 02/04/2015, 1:27 PM   LOS: 5 days

## 2015-02-05 ENCOUNTER — Inpatient Hospital Stay (HOSPITAL_COMMUNITY): Payer: PPO

## 2015-02-05 ENCOUNTER — Encounter (HOSPITAL_COMMUNITY): Payer: Self-pay | Admitting: Radiology

## 2015-02-05 DIAGNOSIS — N185 Chronic kidney disease, stage 5: Secondary | ICD-10-CM

## 2015-02-05 DIAGNOSIS — N19 Unspecified kidney failure: Secondary | ICD-10-CM

## 2015-02-05 LAB — THERAPEUTIC PLASMA EXCHANGE (BLOOD BANK)
Plasma volume needed: 3250
UNIT DIVISION: 0
UNIT DIVISION: 0
UNIT DIVISION: 0
UNIT DIVISION: 0
Unit division: 0
Unit division: 0
Unit division: 0
Unit division: 0
Unit division: 0
Unit division: 0
Unit division: 0

## 2015-02-05 LAB — RENAL FUNCTION PANEL
ANION GAP: 14 (ref 5–15)
Albumin: 2.7 g/dL — ABNORMAL LOW (ref 3.5–5.0)
BUN: 97 mg/dL — AB (ref 6–20)
CHLORIDE: 97 mmol/L — AB (ref 101–111)
CO2: 29 mmol/L (ref 22–32)
Calcium: 8.3 mg/dL — ABNORMAL LOW (ref 8.9–10.3)
Creatinine, Ser: 10.53 mg/dL — ABNORMAL HIGH (ref 0.61–1.24)
GFR calc Af Amer: 5 mL/min — ABNORMAL LOW (ref >60)
GFR, EST NON AFRICAN AMERICAN: 4 mL/min — AB (ref >60)
Glucose, Bld: 97 mg/dL (ref 65–99)
Phosphorus: 11.7 mg/dL — ABNORMAL HIGH (ref 2.5–4.6)
Potassium: 4 mmol/L (ref 3.5–5.1)
Sodium: 140 mmol/L (ref 135–145)

## 2015-02-05 LAB — POCT I-STAT, CHEM 8
BUN: 110 mg/dL — ABNORMAL HIGH (ref 6–20)
CALCIUM ION: 0.81 mmol/L — AB (ref 1.13–1.30)
Chloride: 93 mmol/L — ABNORMAL LOW (ref 101–111)
Creatinine, Ser: 11.4 mg/dL — ABNORMAL HIGH (ref 0.61–1.24)
GLUCOSE: 136 mg/dL — AB (ref 65–99)
HEMATOCRIT: 28 % — AB (ref 39.0–52.0)
HEMOGLOBIN: 9.5 g/dL — AB (ref 13.0–17.0)
Potassium: 3.8 mmol/L (ref 3.5–5.1)
Sodium: 138 mmol/L (ref 135–145)
TCO2: 25 mmol/L (ref 0–100)

## 2015-02-05 LAB — CBC
HCT: 27.7 % — ABNORMAL LOW (ref 39.0–52.0)
Hemoglobin: 9.8 g/dL — ABNORMAL LOW (ref 13.0–17.0)
MCH: 32.6 pg (ref 26.0–34.0)
MCHC: 35.4 g/dL (ref 30.0–36.0)
MCV: 92 fL (ref 78.0–100.0)
PLATELETS: 96 10*3/uL — AB (ref 150–400)
RBC: 3.01 MIL/uL — AB (ref 4.22–5.81)
RDW: 13.6 % (ref 11.5–15.5)
WBC: 11.1 10*3/uL — ABNORMAL HIGH (ref 4.0–10.5)

## 2015-02-05 LAB — BONE MARROW EXAM

## 2015-02-05 MED ORDER — ACD FORMULA A 0.73-2.45-2.2 GM/100ML VI SOLN
Status: AC
  Filled 2015-02-05: qty 500

## 2015-02-05 MED ORDER — SODIUM CHLORIDE 0.9 % IV SOLN
4.0000 g | INTRAVENOUS | Status: AC
  Administered 2015-02-05: 4 g via INTRAVENOUS
  Filled 2015-02-05: qty 40

## 2015-02-05 MED ORDER — MIDAZOLAM HCL 2 MG/2ML IJ SOLN
INTRAMUSCULAR | Status: AC | PRN
  Administered 2015-02-05: 1 mg via INTRAVENOUS

## 2015-02-05 MED ORDER — FENTANYL CITRATE (PF) 100 MCG/2ML IJ SOLN
INTRAMUSCULAR | Status: AC | PRN
  Administered 2015-02-05: 50 ug via INTRAVENOUS

## 2015-02-05 MED ORDER — ACD FORMULA A 0.73-2.45-2.2 GM/100ML VI SOLN
500.0000 mL | Status: DC
  Administered 2015-02-05: 500 mL via INTRAVENOUS
  Filled 2015-02-05: qty 500

## 2015-02-05 MED ORDER — DEXTROSE 5 % IV SOLN
1.5000 g | INTRAVENOUS | Status: AC
  Administered 2015-02-07: 1.5 g via INTRAVENOUS
  Filled 2015-02-05: qty 1.5

## 2015-02-05 MED ORDER — DIPHENHYDRAMINE HCL 25 MG PO CAPS: 25.0000 mg | ORAL_CAPSULE | Freq: Four times a day (QID) | ORAL | Status: DC | PRN

## 2015-02-05 MED ORDER — ACETAMINOPHEN 325 MG PO TABS: 650.0000 mg | ORAL_TABLET | ORAL | Status: DC | PRN

## 2015-02-05 MED ORDER — DIPHENHYDRAMINE HCL 25 MG PO CAPS
25.0000 mg | ORAL_CAPSULE | Freq: Four times a day (QID) | ORAL | Status: DC | PRN
  Administered 2015-02-05: 25 mg via ORAL
  Filled 2015-02-05: qty 1

## 2015-02-05 MED ORDER — LIDOCAINE HCL 1 % IJ SOLN
INTRAMUSCULAR | Status: AC
  Filled 2015-02-05: qty 20

## 2015-02-05 MED ORDER — MIDAZOLAM HCL 2 MG/2ML IJ SOLN
INTRAMUSCULAR | Status: AC
  Filled 2015-02-05: qty 2

## 2015-02-05 MED ORDER — CALCIUM CARBONATE ANTACID 500 MG PO CHEW
2.0000 | CHEWABLE_TABLET | ORAL | Status: DC
  Administered 2015-02-05: 400 mg via ORAL
  Filled 2015-02-05: qty 2

## 2015-02-05 MED ORDER — ANTICOAGULANT SODIUM CITRATE 4% (200MG/5ML) IV SOLN
5.0000 mL | Freq: Once | Status: AC
  Administered 2015-02-05: 5 mL
  Filled 2015-02-05: qty 250

## 2015-02-05 MED ORDER — CALCIUM CARBONATE ANTACID 500 MG PO CHEW
CHEWABLE_TABLET | ORAL | Status: AC
  Administered 2015-02-05: 400 mg via ORAL
  Filled 2015-02-05: qty 2

## 2015-02-05 MED ORDER — ACD FORMULA A 0.73-2.45-2.2 GM/100ML VI SOLN
Status: AC
  Administered 2015-02-05: 500 mL via INTRAVENOUS
  Filled 2015-02-05: qty 500

## 2015-02-05 MED ORDER — FENTANYL CITRATE (PF) 100 MCG/2ML IJ SOLN
INTRAMUSCULAR | Status: AC
  Filled 2015-02-05: qty 2

## 2015-02-05 NOTE — Progress Notes (Signed)
Active 81 yo WM with fairly benign PMH of HTN, COPD, arthritis. Baseline creatinine of 1.27 May 2014. Presented with several days of worsening fatigue, decreased UOP, dyspnea who presents with AKI, creatinine of 12-13, UA showing proteinuria and hematuria, and we were asked to evaluate. Only relevant drug exposures were daily ibuprofen and PPI. Dialysis initiated 01/31/15 via temp cath. Renal biopsy done 6/24. Myeloma studies returned with free kappa LV's >12,000, ratio >800 and TPE initiated 02/03/15.   1. AKI - Oliguric AKI . HD initiated 6/22. Euvolemic now at weight of about 90 kg. Renal bx result pending from 6/24. 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 TPE initiated 6/25.  BM Bx today. No heparin or other anticoagulants for 2 weeks post renal biopsy. Next TPE today. Will need temp cath changed to tunnelled this week and VVS evaluation for permanent access.  Next HD in Am. 2. Anemia -TSat 40%. Hb just over 10. No iron or ESA need just yet. 3. COPD 4. Arthritis - stopped NSAIDS on admission 5. HTN - Atenolol to hs  Subjective: Interval History: gen malaise today  Objective: Vital signs in last 24 hours: Temp:  [97.2 F (36.2 C)-98.2 F (36.8 C)] 97.3 F (36.3 C) (06/27 1200) Pulse Rate:  [56-78] 58 (06/27 1200) Resp:  [15-30] 16 (06/27 1200) BP: (100-152)/(41-71) 115/56 mmHg (06/27 1200) SpO2:  [89 %-100 %] 93 % (06/27 1200) Weight:  [84.5 kg (186 lb 4.6 oz)-92.4 kg (203 lb 11.3 oz)] 92.4 kg (203 lb 11.3 oz) (06/27 0500) Weight change: 3.2 kg (7 lb 0.9 oz)  Intake/Output from previous day: 06/26 0701 - 06/27 0700 In: -  Out: 50 [Urine:50] Intake/Output this shift:    General appearance: alert and cooperative Chest wall: no tenderness Cardio: regular rate and rhythm, S1, S2 normal, no murmur, click, rub or gallop GI: soft, non-tender; bowel sounds normal;  no masses,  no organomegaly Extremities: extremities normal, atraumatic, no cyanosis or edema  Lab Results:  Recent Labs  02/04/15 0455 02/04/15 1248 02/05/15 0557  WBC 12.2*  --  11.1*  HGB 10.3* 9.5* 9.8*  HCT 28.9* 28.0* 27.7*  PLT 99*  --  96*   BMET:  Recent Labs  02/04/15 0455 02/04/15 1248 02/05/15 0602  NA 138 137 140  K 4.4 4.2 4.0  CL 97* 95* 97*  CO2 27  --  29  GLUCOSE 167* 155* 97  BUN 63* 78* 97*  CREATININE 8.17* 10.00* 10.53*  CALCIUM 8.7*  --  8.3*   No results for input(s): PTH in the last 72 hours. Iron Studies: No results for input(s): IRON, TIBC, TRANSFERRIN, FERRITIN in the last 72 hours. Studies/Results: Ct Biopsy  02/05/2015   CLINICAL DATA:  81 year old male with a history of myeloma. He has been referred for CT-guided bone marrow biopsy  EXAM: CT-GUIDED BIOPSY of bone marrow  MEDICATIONS AND MEDICAL HISTORY: Versed 1.0 mg, Fentanyl 50 mcg.  Additional Medications: None.  ANESTHESIA/SEDATION: Moderate sedation time: 10 minutes  PROCEDURE: The procedure risks, benefits, and alternatives were explained to the patient. Questions regarding the procedure were encouraged and answered. The patient understands and consents to the procedure.  Scout CT of the pelvis was performed for surgical planning purposes.  The posterior pelvis was prepped with Betadinein a sterile fashion, and a sterile drape was applied covering the operative field. A sterile gown and sterile gloves were used for the  procedure. Local anesthesia was provided with 1% Lidocaine.  We targeted the left posterior iliac bone for biopsy. The skin and subcutaneous tissues were infiltrated with 1% lidocaine without epinephrine. A small stab incision was made with an 11 blade scalpel, and an 11 gauge Murphy needle was advanced with CT guidance to the posterior cortex. Manual forced was used to advance the needle through the posterior cortex and the stylet was removed. A bone marrow aspirate was retrieved  and passed to a cytotechnologist in the room. The Murphy needle was then advanced without the stylet for a core biopsy. The core biopsy was retrieved and also passed to a cytotechnologist.  Manual pressure was used for hemostasis and a sterile dressing was placed.  No complications were encountered no significant blood loss was encountered.  Patient tolerated the procedure well and remained hemodynamically stable throughout.  Marland Kitchen  FINDINGS: Scout image demonstrates safe approach to posterior iliac bone.  Images during the case demonstrate placement of 11 gauge Murphy needle  COMPLICATIONS: None  IMPRESSION: Status post CT-guided bone marrow biopsy, with tissue specimen sent to pathology for complete histopathologic analysis  Signed,  Dulcy Fanny. Earleen Newport, DO  [facility]  [facility]   Electronically Signed   By: Corrie Mckusick D.O.   On: 02/05/2015 10:27   Dg Bone Survey Met  02/03/2015   CLINICAL DATA:  Multiple myeloma  EXAM: METASTATIC BONE SURVEY  COMPARISON:  Multiple exams, including 01/30/2015  FINDINGS: No punched out lucent cortical lesions characteristic of multiple myeloma identified.  Scattered degenerative findings. Aortoiliac atherosclerotic vascular disease. Left IJ line tip: SVC.  IMPRESSION: 1. No lucent lesions characteristic of multiple myeloma are identified.   Electronically Signed   By: Van Clines M.D.   On: 02/03/2015 21:30   Scheduled: . antiseptic oral rinse  7 mL Mouth Rinse BID  . atenolol  25 mg Oral QHS  . budesonide-formoterol  2 puff Inhalation BID  . calcium gluconate IVPB  4 g Intravenous To Hemo  . citrate dextrose      . fentaNYL      . fluticasone  1 spray Each Nare Daily  . lidocaine      . midazolam      . sodium chloride  3 mL Intravenous Q12H      LOS: 6 days   Kazuki Ingle C 02/05/2015,1:54 PM

## 2015-02-05 NOTE — Progress Notes (Signed)
Right  Upper Extremity Vein Map    Cephalic  Segment Diameter Depth Comment  1. Axilla 4.68mm    2. Mid upper arm 4.39mm    3. Above [facility] 4.4mm  Branch  4. In [facility] 4.38mm    5. Below AC 3.30mm    6. Mid forearm 3.17mm    7. Wrist 3.59mm                     Left Upper Extremity Vein Map    Cephalic  Segment Diameter Depth Comment  1. Axilla 2.26mm    2. Mid upper arm 2.16mm    3. Above AC 3.21mm    4. In [facility] 2.45mm    5. Below AC 67mm    6. Mid forearm 3.37mm    7. Wrist 3.7mm                    02/05/2015 10:43 AM Maudry Mayhew, RVT, RDCS, RDMS    .msdme

## 2015-02-05 NOTE — Progress Notes (Signed)
TPE- Treatment 3/7 complete without issue, pt had no complaints. No nausea this treatment. Received 4g calcium gluconate d/t low ionized calcium today. Report called to [facility], pt transferred in stable condition.

## 2015-02-05 NOTE — Progress Notes (Signed)
[place] TEAM 1 - Stepdown/ICU TEAM Progress Note  Justin Woods XFG:[id number] DOB: June 22, 1931 DOA: 01/30/2015 PCP: Wenda Low, MD  Admit HPI / Brief Narrative: 81yo male with history of HTN and renal stones who went to see his PCP c/o fatigue with decreased urine output.  Labs noted creatinine of 12.6, w/ baseline at PCP office in October 2015 of 1.17.  CT abdomen and pelvis did not show evidence of renal stones or hydronephrosis, only mild pre-ureteral stranding.  Patient was seen by Urology Dr.Herrick and had Foley catheter inserted with minimal urine output (total 150 mL).  HPI/Subjective: The patient states that his nausea has resolved.  He denies chest pain shortness of breath abdominal pain or specific complaints.  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 - poor response to diuretic - Nephrology directing HD - renal bx 6/24 - SPEP noted elevated kappa free light chains prompting Heme consult - undergoing plasmapheresis daily to complete 7 days of treatment  Suspected MM Heme now following - BMbx today per IR  Hyperkalemia Due to acute renal failure - corrected with hemodialysis  Acute hypoxic respiratory failure - pulmonary edema pulmonary edema related to acute renal failure - resolved with ongoing dialysis  Anemia Likely related to kidney failure + MM - no indication for epo or iron at this time  Hypertension Blood pressure reasonably controlled at present time - follow without change in treatment plan   Prior history of DVT during hospitalization Unable to use anticoagulation of any type for 2 weeks post renal biopsy  COPD No evidence of acute exacerbation at present time  Chronic arthritis Denies focal joint pain at this time  Code Status: FULL Family Communication: No family present at time of exam as pt seen in  HD  Disposition Plan: SDU  Consultants: Nephrology  Hematology/Oncology  Procedures: 6/22 insertion L IJ temporary hemodialysis catheter 6/22 HD initiated  6/24 kidney bx 6/25 plasmapheresis initiated 6/27 BMbx  Antibiotics: None   DVT prophylaxis: SCDs (post renal bx)  Objective: Blood pressure 113/51, pulse 74, temperature 97.8 F (36.6 C), temperature source Oral, resp. rate 20, height 5\' 6"  (1.676 m), weight 92.4 kg (203 lb 11.3 oz), SpO2 93 %. No intake or output data in the 24 hours ending 02/05/15 1534   Exam: General: Respirations appear comfortable - alert / conversant Lungs: No appreciable crackles or wheezes - good air movement th/o all fields  Cardiovascular: Regular rate and rhythm without gallop or rub or M Abdomen: Nontender, mildly protuberant, soft, bowel sounds positive, no rebound, no ascites, no mass Extremities: No significant cyanosis, or clubbing, trace edema bilateral lower extremities  Data Reviewed: Basic Metabolic Panel:  Recent Labs Lab 02/01/15 0211 02/02/15 0454 02/03/15 0550 02/03/15 1606 02/04/15 0455 02/04/15 1248 02/05/15 0602  NA 140 137  136 138 137 138 137 140  K 4.9 4.2  4.2 4.5 3.9 4.4 4.2 4.0  CL 101 98*  97* 97* 98* 97* 95* 97*  CO2 26 25  24 22   --  27  --  29  GLUCOSE 112* 210*  208* 172* 200* 167* 155* 97  BUN 57* 47*  48* 89* 42* 63* 78* 97*  CREATININE 11.73* 9.49*  9.48* 12.39* 7.50* 8.17* 10.00* 10.53*  CALCIUM 8.5* 8.1*  8.1* 8.6*  --  8.7*  --  8.3*  MG  --  2.1  --   --   --   --   --  PHOS 8.0* 6.5* 9.1*  --  9.6*  --  11.7*    CBC:  Recent Labs Lab 01/30/15 1435  02/01/15 0211 02/02/15 0454 02/03/15 0550 02/03/15 1606 02/04/15 0455 02/04/15 1248 02/05/15 0557  WBC 9.4  < > 8.7 10.5 14.7*  --  12.2*  --  11.1*  NEUTROABS 5.8  --   --   --   --   --   --   --   --   HGB 11.1*  < > 10.2* 10.3* 10.1* 11.2* 10.3* 9.5* 9.8*  HCT 31.1*  < > 29.5* 28.9* 28.4* 33.0* 28.9* 28.0* 27.7*  MCV 94.0   < > 94.2 91.7 92.5  --  91.7  --  92.0  PLT 113*  < > 107* 114* 128*  --  99*  --  96*  < > = values in this interval not displayed.  Liver Function Tests:  Recent Labs Lab 01/30/15 1435  02/01/15 0211 02/02/15 0454 02/03/15 0550 02/04/15 0455 02/05/15 0602  AST 28  --   --  29  --   --   --   ALT 28  --   --  19  --   --   --   ALKPHOS 64  --   --  71  --   --   --   BILITOT 1.5*  --   --  0.9  --   --   --   PROT 6.5  --   --  5.9*  --   --   --   ALBUMIN 3.9  < > 2.9* 3.0*  3.0* 2.9* 2.9* 2.7*  < > = values in this interval not displayed.  Recent Results (from the past 240 hour(s))  MRSA PCR Screening     Status: None   Collection Time: 01/30/15  7:01 PM  Result Value Ref Range Status   MRSA by PCR NEGATIVE NEGATIVE Final    Comment:        The GeneXpert MRSA Assay (FDA approved for NASAL specimens only), is one component of a comprehensive MRSA colonization surveillance program. It is not intended to diagnose MRSA infection nor to guide or monitor treatment for MRSA infections.      Studies:   Recent x-ray studies have been reviewed in detail by the Attending Physician  Scheduled Meds:  Scheduled Meds: . anticoagulant sodium citrate  5 mL Intracatheter Once  . antiseptic oral rinse  7 mL Mouth Rinse BID  . atenolol  25 mg Oral QHS  . budesonide-formoterol  2 puff Inhalation BID  . calcium carbonate  2 tablet Oral Q3H  . calcium gluconate IVPB  4 g Intravenous To Hemo  . citrate dextrose      . fentaNYL      . fluticasone  1 spray Each Nare Daily  . lidocaine      . midazolam      . sodium chloride  3 mL Intravenous Q12H    Time spent on care of this patient: 25 mins   [facility] T , MD   [facility] Office  [phone] Pager - Text Page per Shea Evans as per below:  On-Call/Text Page:      [email or web address]      password [email or web address]  If 7PM-7AM, please contact night-coverage [email or web address] Password [facility] 02/05/2015, 3:34 PM   LOS: 6 days

## 2015-02-05 NOTE — Progress Notes (Signed)
Pt resting comfortably on room air, not in distress. Not placed on BiPAP, RT will continue to monitor

## 2015-02-05 NOTE — Procedures (Signed)
Interventional Radiology Procedure Note  Procedure: CT guided aspirate and core biopsy of left posterior iliac bone Complications: None Recommendations: - Bedrest supine x 3 hrs  - Follow biopsy results  Signed,  Kymber Kosar S. Asiya Cutbirth, DO    

## 2015-02-05 NOTE — Care Management (Signed)
Important Message  Patient Details  Name: Justin Woods MRN: [id number] Date of Birth: Oct 03, 1930   Medicare Important Message Given:  Yes-second notification given    Nathen May 02/05/2015, 1:00 PM

## 2015-02-05 NOTE — Progress Notes (Signed)
Physical Therapy Treatment Patient Details Name: Justin Woods MRN: [id number] DOB: [birth year] Today's Date: 2015-02-25    History of Present Illness Justin Woods is a 81 y.o. male who presented to his PCP with c/o fatigue, LE swelling, SOB, and with acute kidney failure, potential myeloma with plasmpharesis and HD initiated acutely, Hx of HTN    PT Comments    Pt very pleasant and eager to return to active lifestyle outside gardening and working on apartments. Pt with increased activity tolerance today with sats maintained 89-97% on RA with all activity. HR 68-73 with gait. Pt educated for HEP and encouraged increased mobility and gait acutely.   Follow Up Recommendations  Home health PT;Supervision for mobility/OOB     Equipment Recommendations  Rolling walker with 5" wheels    Recommendations for Other Services       Precautions / Restrictions Precautions Precautions: Fall    Mobility  Bed Mobility Overal bed mobility: Modified Independent                Transfers Overall transfer level: Modified independent                  Ambulation/Gait Ambulation/Gait assistance: Supervision Ambulation Distance (Feet): 450 Feet Assistive device: Rolling walker (2 wheeled) Gait Pattern/deviations: Step-through pattern;Decreased stride length   Gait velocity interpretation: Below normal speed for age/gender General Gait Details: cues for posture and directional cues with cues for breathing technique   Stairs            Wheelchair Mobility    Modified Rankin (Stroke Patients Only)       Balance                                    Cognition Arousal/Alertness: Awake/alert Behavior During Therapy: WFL for tasks assessed/performed Overall Cognitive Status: Within Functional Limits for tasks assessed                      Exercises General Exercises - Lower Extremity Long Arc Quad: AROM;Both;Seated;15 reps Hip  Flexion/Marching: AROM;Both;Seated;15 reps    General Comments        Pertinent Vitals/Pain Pain Assessment: No/denies pain    Home Living                      Prior Function            PT Goals (current goals can now be found in the care plan section) Progress towards PT goals: Progressing toward goals    Frequency       PT Plan Current plan remains appropriate    Co-evaluation             End of Session   Activity Tolerance: Patient tolerated treatment well Patient left: in chair;with call bell/phone within reach;with family/visitor present;with nursing/sitter in room     Time: 0802-0825 PT Time Calculation (min) (ACUTE ONLY): 23 min  Charges:  $Gait Training: 8-22 mins $Therapeutic Exercise: 8-22 mins                    G Codes:      Melford Aase Feb 25, 2015, 9:23 AM Elwyn Reach, [place]

## 2015-02-05 NOTE — Consult Note (Signed)
Patient name: Justin Woods MRN: [id number] DOB: 08-May-1931 Sex: male   Referred by: Florene Glen  Reason for referral:  Chief Complaint  Patient presents with  . high creatinine     HISTORY OF PRESENT ILLNESS: She is very pleasant 81 year old gentleman who had normal renal function. He presented with end-stage renal disease with generalized malaise. Was admitted and had a temporary catheter placed in his left internal jugular vein and this had hemodialysis through this. He is just finished hemodialysis and is still in the unit. No difficulty today with his acute catheter. We are seeing him for discussion of long term access. He is scheduled for hemodialysis again tomorrow  Past Medical History  Diagnosis Date  . Hypertension   . COPD (chronic obstructive pulmonary disease)   . Asthma   . Chronic kidney disease   . History of kidney stones   . History of hiatal hernia   . Headache     HISTORY OF CLUSTER HEADACHES  . Arthritis   . Complication of anesthesia     TROUBLE WAKING UP  . H/O colonoscopy with polypectomy   . Allergic rhinitis   . DVT (deep venous thrombosis)     RIGHT CALF AFTER A LITHOTRIPSY  . BPH (benign prostatic hypertrophy)   . Osteoarthritis     Past Surgical History  Procedure Laterality Date  . Lithotripsy    . Back surgery    . Arthroscopy right knee    . Cholecystectomy    . Skin cancer removed from ears      History   Social History  . Marital Status: Married    Spouse Name: N/A  . Number of Children: N/A  . Years of Education: N/A   Occupational History  . Not on file.   Social History Main Topics  . Smoking status: Former Smoker -- 2.00 packs/day    Types: Cigarettes  . Smokeless tobacco: Never Used  . Alcohol Use: No  . Drug Use: No  . Sexual Activity: No   Other Topics Concern  . Not on file   Social History Narrative    Family History  Problem Relation Age of Onset  . Hypertension Mother   . Hypertension Father      Allergies as of 01/30/2015  . (No Known Allergies)    No current facility-administered medications on file prior to encounter.   No current outpatient prescriptions on file prior to encounter.     REVIEW OF SYSTEMS: Reviewed in his history and physical with nothing to add  PHYSICAL EXAMINATION:  General: The patient is a well-nourished male, in no acute distress. Vital signs are BP 115/68 mmHg  Pulse 78  Temp(Src) 98 F (36.7 C) (Oral)  Resp 18  Ht 5\' 6"  (1.676 m)  Wt 203 lb 11.3 oz (92.4 kg)  BMI 32.89 kg/m2  SpO2 94% Pulmonary: There is a good air exchange  Abdomen: Soft and non-tender  Musculoskeletal: There are no major deformities.  There is no significant extremity pain. Neurologic: No focal weakness or paresthesias are detected, Skin: There are no ulcer or rashes noted. Psychiatric: The patient has normal affect. Cardiovascular: 2+ radial pulses bilaterally   Vascular Lab Studies:  Patent and greater than 3 mm cephalic veins bilaterally. Good size in both forearms and somewhat larger in the right upper arm  Impression and Plan:  I discussed the options for hemodialysis the patient. Explained limitations of acute catheter. Explained that tunneled catheter placement and also AV  graft and AV fistula. He appears to be an excellent candidate for AV fistula creation. Will restrict use of his left arm. Plan tunneled catheter and left arm access on Wednesday. He has both hemodialysis and plasmapheresis planned for tomorrow.    Curt Jews [facility] Office: [phone]

## 2015-02-06 DIAGNOSIS — I272 Pulmonary hypertension, unspecified: Secondary | ICD-10-CM | POA: Diagnosis present

## 2015-02-06 DIAGNOSIS — I270 Primary pulmonary hypertension: Secondary | ICD-10-CM

## 2015-02-06 DIAGNOSIS — I82409 Acute embolism and thrombosis of unspecified deep veins of unspecified lower extremity: Secondary | ICD-10-CM | POA: Diagnosis present

## 2015-02-06 LAB — CBC WITH DIFFERENTIAL/PLATELET
BASOS ABS: 0 10*3/uL (ref 0.0–0.1)
Basophils Relative: 0 % (ref 0–1)
EOS ABS: 0.3 10*3/uL (ref 0.0–0.7)
Eosinophils Relative: 3 % (ref 0–5)
HCT: 27.1 % — ABNORMAL LOW (ref 39.0–52.0)
Hemoglobin: 9.6 g/dL — ABNORMAL LOW (ref 13.0–17.0)
LYMPHS PCT: 25 % (ref 12–46)
Lymphs Abs: 2.1 10*3/uL (ref 0.7–4.0)
MCH: 33.1 pg (ref 26.0–34.0)
MCHC: 35.4 g/dL (ref 30.0–36.0)
MCV: 93.4 fL (ref 78.0–100.0)
Monocytes Absolute: 1.1 10*3/uL — ABNORMAL HIGH (ref 0.1–1.0)
Monocytes Relative: 13 % — ABNORMAL HIGH (ref 3–12)
NEUTROS PCT: 59 % (ref 43–77)
Neutro Abs: 4.9 10*3/uL (ref 1.7–7.7)
PLATELETS: 93 10*3/uL — AB (ref 150–400)
RBC: 2.9 MIL/uL — ABNORMAL LOW (ref 4.22–5.81)
RDW: 13.6 % (ref 11.5–15.5)
WBC: 8.4 10*3/uL (ref 4.0–10.5)

## 2015-02-06 LAB — POCT I-STAT, CHEM 8
BUN: 44 mg/dL — ABNORMAL HIGH (ref 6–20)
CALCIUM ION: 1.06 mmol/L — AB (ref 1.13–1.30)
CHLORIDE: 98 mmol/L — AB (ref 101–111)
Creatinine, Ser: 7.1 mg/dL — ABNORMAL HIGH (ref 0.61–1.24)
GLUCOSE: 116 mg/dL — AB (ref 65–99)
HCT: 28 % — ABNORMAL LOW (ref 39.0–52.0)
Hemoglobin: 9.5 g/dL — ABNORMAL LOW (ref 13.0–17.0)
Potassium: 3.8 mmol/L (ref 3.5–5.1)
Sodium: 139 mmol/L (ref 135–145)
TCO2: 26 mmol/L (ref 0–100)

## 2015-02-06 LAB — THERAPEUTIC PLASMA EXCHANGE (BLOOD BANK)
Plasma volume needed: 3250
UNIT DIVISION: 0
UNIT DIVISION: 0
Unit division: 0
Unit division: 0
Unit division: 0
Unit division: 0
Unit division: 0
Unit division: 0
Unit division: 0
Unit division: 0
Unit division: 0

## 2015-02-06 LAB — RENAL FUNCTION PANEL
Albumin: 2.7 g/dL — ABNORMAL LOW (ref 3.5–5.0)
Anion gap: 16 — ABNORMAL HIGH (ref 5–15)
BUN: 117 mg/dL — ABNORMAL HIGH (ref 6–20)
CHLORIDE: 95 mmol/L — AB (ref 101–111)
CO2: 29 mmol/L (ref 22–32)
Calcium: 8.3 mg/dL — ABNORMAL LOW (ref 8.9–10.3)
Creatinine, Ser: 12.38 mg/dL — ABNORMAL HIGH (ref 0.61–1.24)
GFR, EST AFRICAN AMERICAN: 4 mL/min — AB (ref >60)
GFR, EST NON AFRICAN AMERICAN: 3 mL/min — AB (ref >60)
Glucose, Bld: 90 mg/dL (ref 65–99)
POTASSIUM: 4.2 mmol/L (ref 3.5–5.1)
Phosphorus: 13.6 mg/dL — ABNORMAL HIGH (ref 2.5–4.6)
SODIUM: 140 mmol/L (ref 135–145)

## 2015-02-06 MED ORDER — ACETAMINOPHEN 325 MG PO TABS: 650.0000 mg | ORAL_TABLET | ORAL | Status: DC | PRN

## 2015-02-06 MED ORDER — ANTICOAGULANT SODIUM CITRATE 4% (200MG/5ML) IV SOLN
5.0000 mL | Freq: Once | Status: DC
  Administered 2015-02-06: 5 mL
  Filled 2015-02-06: qty 250

## 2015-02-06 MED ORDER — CALCIUM CARBONATE ANTACID 500 MG PO CHEW: 2.0000 | CHEWABLE_TABLET | ORAL | Status: AC

## 2015-02-06 MED ORDER — SODIUM CHLORIDE 0.9 % IV SOLN
2.0000 g | Freq: Once | INTRAVENOUS | Status: DC
  Administered 2015-02-06: 2 g via INTRAVENOUS
  Filled 2015-02-06: qty 20

## 2015-02-06 MED ORDER — DIPHENHYDRAMINE HCL 25 MG PO CAPS: 25.0000 mg | ORAL_CAPSULE | Freq: Four times a day (QID) | ORAL | Status: DC | PRN

## 2015-02-06 MED ORDER — ACD FORMULA A 0.73-2.45-2.2 GM/100ML VI SOLN
500.0000 mL | Status: DC
  Administered 2015-02-06 – 2015-02-07 (×2): 500 mL via INTRAVENOUS
  Filled 2015-02-06: qty 500

## 2015-02-06 MED ORDER — ACD FORMULA A 0.73-2.45-2.2 GM/100ML VI SOLN
Status: AC
  Administered 2015-02-06: 500 mL via INTRAVENOUS
  Filled 2015-02-06: qty 1000

## 2015-02-06 NOTE — Procedures (Signed)
  Active 81 yo WM with fairly benign PMH of HTN, COPD, arthritis. Baseline creatinine of 1.27 May 2014. 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 AKI - Oliguric AKI . HD initiated 6/22. Euvolemic now at weight of about 90 kg.   Tolerating HD today but uncomfortable. No hemodynamic issues.  For TPE # 4 today.  Justin Woods

## 2015-02-06 NOTE — Progress Notes (Signed)
TPE 4/7 complete without issue. Pt tolerated well. Received 2g calcium gluconate as replacement. Report called to [place], pt has no complaints.

## 2015-02-06 NOTE — Progress Notes (Signed)
Patient is resting comfortably on room air and is in no distress. BIPAP is not needed at this time. Will continue to monitor.

## 2015-02-06 NOTE — Anesthesia Preprocedure Evaluation (Addendum)
Anesthesia Evaluation  Patient identified by MRN, date of birth, ID band Patient awake    Reviewed: Allergy & Precautions  History of Anesthesia Complications (+) PROLONGED EMERGENCE and history of anesthetic complications  Airway Mallampati: I  TM Distance: >3 FB Neck ROM: Full    Dental  (+) Dental Advisory Given   Pulmonary COPDformer smoker,    Pulmonary exam normal       Cardiovascular hypertension, Normal cardiovascular exam    Neuro/Psych    GI/Hepatic Neg liver ROS, hiatal hernia,   Endo/Other    Renal/GU ESRFRenal disease     Musculoskeletal   Abdominal   Peds  Hematology   Anesthesia Other Findings   Reproductive/Obstetrics                           Anesthesia Physical Anesthesia Plan  ASA: III  Anesthesia Plan: General   Post-op Pain Management:    Induction: Intravenous  Airway Management Planned: LMA  Additional Equipment:   Intra-op Plan:   Post-operative Plan: Extubation in OR  Informed Consent: I have reviewed the patients History and Physical, chart, labs and discussed the procedure including the risks, benefits and alternatives for the proposed anesthesia with the patient or authorized representative who has indicated his/her understanding and acceptance.   Dental advisory given  Plan Discussed with: CRNA, Anesthesiologist and Surgeon  Anesthesia Plan Comments:         Anesthesia Quick Evaluation

## 2015-02-06 NOTE — Care Management (Signed)
Important Message  Patient Details  Name: Justin Woods MRN: [id number] Date of Birth: 1931-02-19   Medicare Important Message Given:  Yes-second notification given    Pricilla Handler 02/06/2015, 7:54 AM

## 2015-02-06 NOTE — Progress Notes (Signed)
Justin Woods - Stepdown/ICU TEAM Progress Note  Justin Woods NLG:[id number] DOB: October 28, 1930 DOA: 01/30/2015 PCP: Wenda Low, MD  Admit HPI / Brief Narrative: 81yo WM PMHx COPD, HTN, CTD, Nephrolithiasis, DVT right calf. Went to see his PCP c/o fatigue with decreased urine output. Labs noted creatinine of 12.6, w/ baseline at PCP office in October 2015 of Woods.17. CT abdomen and pelvis did not show evidence of renal stones or hydronephrosis, only mild pre-ureteral stranding. Patient was seen by Urology Dr.Herrick and had Foley catheter inserted with minimal urine output (total 150 mL).   HPI/Subjective: 6/28 A/O 4, states positive fatigue following this mornings HD session, negative SOB, negative N/V.   Assessment/Plan: Acute renal failure/ESRD on HD? (Baseline creatinine Woods.27 May 2014) - Most likely secondary to multiple myeloma vs MGUS; followed by oncology. -Despite HD patient's Cr continues to worsen. -Progressive surgery patient scheduled for tunnel catheter on left arm on 6/29 -Patient will continue HD per nephrology. -6/27 bone marrow biopsy; results pending -6/27 renal biopsy results pending  Multiple myeloma? -Will continue plasmapheresis per oncology. -If multiple myeloma confirmed patient will be started on SQ Velcade and Decadron.   Hyperkalemia -Resolved, monitor closely   Acute hypoxic respiratory failure - pulmonary edema -Resolved patient currently on room air   -Strict in and out since admission -5.8 L -Echocardiogram; mild hypertension, see results below  Pulmonary hypertension -Mild, would just monitor at this point.  Anemia -Likely related to kidney failure + MM - no indication for epo or iron at this time  Hypertension -Blood pressure reasonably controlled at present time   Prior history of DVT during hospitalization -Unable to use anticoagulation of any type for 2 weeks post renal biopsy  COPD -No evidence of acute exacerbation at  present time  Chronic arthritis -Denies focal joint pain at this time    Code Status: FULL Family Communication: Wife present at time of exam Disposition Plan: Per nephrology    Consultants: Dr.Wesam Kathryne Sharper (PCCM) Dr.Cynthia Lorrene Reid (nephrology) Dr.Herrick (urology) Dr.Mohamed [facility] (oncology) Dr.Todd F Early (vascular surgery)   Procedure/Significant Events: 6/22 urine was positive for lambda light chains and M spike 6/24 echocardiogram;- LVEF=55%- 60%. -(grade Woods diastolicdysfunction). - Pulmonary arteries: PA peak pressure: 39 mm Hg (S). 6/27 bone marrow biopsy; results pending   Culture 6/21 MRSA by PCR negative   Antibiotics:   DVT prophylaxis: SCDs    Devices    LINES / TUBES:  Lt IJ HD cath 6/22>>    Continuous Infusions:   Objective: VITAL SIGNS: Temp: 97.6 F (36.4 C) (06/28 1224) Temp Source: Oral (06/28 1224) BP: 120/48 mmHg (06/28 1224) Pulse Rate: 74 (06/28 1224) SPO2; FIO2:   Intake/Output Summary (Last 24 hours) at 02/06/15 1322 Last data filed at 02/06/15 1130  Gross per 24 hour  Intake      0 ml  Output    959 ml  Net   -959 ml     Exam: General: A/O 4, NAD, sitting in chair comfortably.  Eyes: Negative headache, eye pain, double vision, negative retinal hemorrhage ENT: Negative Runny nose, negative ear pain, negative tinnitus, negative gingival bleeding Neck:  Negative scars, masses, torticollis, lymphadenopathy, negative JVD, left IJ in place negative sign of infection covered and clean. Lungs: Clear to auscultation bilaterally,negative wheezing Cardiovascular: Regular rate and rhythm without murmur gallop or rub normal S1 and S2 Abdomen:negative abdominal pain, negative dysphagia, Nontender, nondistended, soft, bowel sounds positive, no rebound, no ascites, no appreciable mass Extremities: No significant cyanosis, clubbing,  or edema bilateral lower extremities Psychiatric:  Negative depression, negative anxiety,  negative fatigue, negative mania  Neurologic:  Cranial nerves II through XII intact, tongue/uvula midline, all extremities muscle strength 5/5, sensation intact throughout, negative dysarthria, negative expressive aphasia, negative receptive aphasia.     Data Reviewed: Basic Metabolic Panel:  Recent Labs Lab 02/02/15 0454 02/03/15 0550  02/04/15 0455 02/04/15 1248 02/05/15 0602 02/05/15 1425 02/06/15 0810  NA 137  136 138  < > 138 137 140 138 140  K 4.2  4.2 4.5  < > 4.4 4.2 4.0 3.8 4.2  CL 98*  97* 97*  < > 97* 95* 97* 93* 95*  CO2 $Re'25  24 22  'NBQ$ --  27  --  29  --  29  GLUCOSE 210*  208* 172*  < > 167* 155* 97 136* 90  BUN 47*  48* 89*  < > 63* 78* 97* 110* 117*  CREATININE 9.49*  9.48* 12.39*  < > 8.17* 10.00* 10.53* 11.40* 12.38*  CALCIUM 8.Woods*  8.Woods* 8.6*  --  8.7*  --  8.3*  --  8.3*  MG 2.Woods  --   --   --   --   --   --   --   PHOS 6.5* 9.Woods*  --  9.6*  --  11.7*  --  13.6*  < > = values in this interval not displayed. Liver Function Tests:  Recent Labs Lab 01/30/15 1435  02/02/15 0454 02/03/15 0550 02/04/15 0455 02/05/15 0602 02/06/15 0810  AST 28  --  29  --   --   --   --   ALT 28  --  19  --   --   --   --   ALKPHOS 64  --  71  --   --   --   --   BILITOT Woods.5*  --  0.9  --   --   --   --   PROT 6.5  --  5.9*  --   --   --   --   ALBUMIN 3.9  < > 3.0*  3.0* 2.9* 2.9* 2.7* 2.7*  < > = values in this interval not displayed. No results for input(s): LIPASE, AMYLASE in the last 168 hours. No results for input(s): AMMONIA in the last 168 hours. CBC:  Recent Labs Lab 01/30/15 1435  02/02/15 0454 02/03/15 0550  02/04/15 0455 02/04/15 1248 02/05/15 0557 02/05/15 1425 02/06/15 0811  WBC 9.4  < > 10.5 14.7*  --  12.2*  --  11.Woods*  --  8.4  NEUTROABS 5.8  --   --   --   --   --   --   --   --  4.9  HGB 11.Woods*  < > 10.3* 10.Woods*  < > 10.3* 9.5* 9.8* 9.5* 9.6*  HCT 31.Woods*  < > 28.9* 28.4*  < > 28.9* 28.0* 27.7* 28.0* 27.Woods*  MCV 94.0  < > 91.7 92.5  --  91.7  --   92.0  --  93.4  PLT 113*  < > 114* 128*  --  99*  --  96*  --  93*  < > = values in this interval not displayed. Cardiac Enzymes: No results for input(s): CKTOTAL, CKMB, CKMBINDEX, TROPONINI in the last 168 hours. BNP (last 3 results) No results for input(s): BNP in the last 8760 hours.  ProBNP (last 3 results) No results for input(s): PROBNP in the last 8760 hours.  CBG:  No results for input(s): GLUCAP in the last 168 hours.  Recent Results (from the past 240 hour(s))  MRSA PCR Screening     Status: None   Collection Time: 01/30/15  7:01 PM  Result Value Ref Range Status   MRSA by PCR NEGATIVE NEGATIVE Final    Comment:        The GeneXpert MRSA Assay (FDA approved for NASAL specimens only), is one component of a comprehensive MRSA colonization surveillance program. It is not intended to diagnose MRSA infection nor to guide or monitor treatment for MRSA infections.      Studies:  Recent x-ray studies have been reviewed in detail by the Attending Physician  Scheduled Meds:  Scheduled Meds: . antiseptic oral rinse  7 mL Mouth Rinse BID  . atenolol  25 mg Oral QHS  . budesonide-formoterol  2 puff Inhalation BID  . [START ON 02/07/2015] cefUROXime (ZINACEF)  IV  Woods.5 g Intravenous On Call to OR  . fluticasone  Woods spray Each Nare Daily  . sodium chloride  3 mL Intravenous Q12H    Time spent on care of this patient: 40 mins   Reinaldo Helt, Geraldo Docker , MD  [facility] Office  [phone] Pager [phone]  On-Call/Text Page:      [email or web address]      password [email or web address]  If 7PM-7AM, please contact night-coverage [email or web address] Password [email or web address] 02/06/2015, Woods:22 PM   LOS: 7 days   Care during the described time interval was provided by me .  I have reviewed this patient's available data, including medical history, events of note, physical examination, and all test results as part of my evaluation. I have personally reviewed and interpreted all radiology studies.   Dia Crawford, MD [phone] Pager

## 2015-02-07 ENCOUNTER — Inpatient Hospital Stay (HOSPITAL_COMMUNITY): Payer: PPO | Admitting: Anesthesiology

## 2015-02-07 ENCOUNTER — Inpatient Hospital Stay (HOSPITAL_COMMUNITY): Payer: PPO

## 2015-02-07 ENCOUNTER — Encounter (HOSPITAL_COMMUNITY)
Admission: EM | Disposition: A | Discharge status: Home Health | Payer: Self-pay | Source: Home / Self Care | Attending: Internal Medicine

## 2015-02-07 ENCOUNTER — Other Ambulatory Visit: Payer: Self-pay | Admitting: *Deleted

## 2015-02-07 DIAGNOSIS — N186 End stage renal disease: Secondary | ICD-10-CM

## 2015-02-07 DIAGNOSIS — Z992 Dependence on renal dialysis: Secondary | ICD-10-CM

## 2015-02-07 DIAGNOSIS — C9000 Multiple myeloma not having achieved remission: Secondary | ICD-10-CM

## 2015-02-07 DIAGNOSIS — Z4931 Encounter for adequacy testing for hemodialysis: Secondary | ICD-10-CM

## 2015-02-07 HISTORY — PX: INSERTION OF DIALYSIS CATHETER: SHX1324

## 2015-02-07 HISTORY — PX: REMOVAL OF A DIALYSIS CATHETER: SHX6053

## 2015-02-07 HISTORY — PX: AV FISTULA PLACEMENT: SHX1204

## 2015-02-07 LAB — IGG, IGA, IGM
IGA: 130 mg/dL (ref 61–437)
IgG (Immunoglobin G), Serum: 769 mg/dL (ref 700–1600)
IgM, Serum: 65 mg/dL (ref 15–143)

## 2015-02-07 LAB — BASIC METABOLIC PANEL
ANION GAP: 14 (ref 5–15)
BUN: 58 mg/dL — AB (ref 6–20)
CHLORIDE: 99 mmol/L — AB (ref 101–111)
CO2: 29 mmol/L (ref 22–32)
Calcium: 8 mg/dL — ABNORMAL LOW (ref 8.9–10.3)
Creatinine, Ser: 8.35 mg/dL — ABNORMAL HIGH (ref 0.61–1.24)
GFR, EST AFRICAN AMERICAN: 6 mL/min — AB (ref >60)
GFR, EST NON AFRICAN AMERICAN: 5 mL/min — AB (ref >60)
Glucose, Bld: 96 mg/dL (ref 65–99)
POTASSIUM: 4 mmol/L (ref 3.5–5.1)
SODIUM: 142 mmol/L (ref 135–145)

## 2015-02-07 LAB — THERAPEUTIC PLASMA EXCHANGE (BLOOD BANK)
Plasma Exchange: 3250
Plasma volume needed: 3250
UNIT DIVISION: 0
UNIT DIVISION: 0
UNIT DIVISION: 0
UNIT DIVISION: 0
UNIT DIVISION: 0
UNIT DIVISION: 0
UNIT DIVISION: 0
Unit division: 0
Unit division: 0
Unit division: 0
Unit division: 0
Unit division: 0
Unit division: 0

## 2015-02-07 LAB — CBC
HCT: 25.6 % — ABNORMAL LOW (ref 39.0–52.0)
Hemoglobin: 9.1 g/dL — ABNORMAL LOW (ref 13.0–17.0)
MCH: 33.3 pg (ref 26.0–34.0)
MCHC: 35.5 g/dL (ref 30.0–36.0)
MCV: 93.8 fL (ref 78.0–100.0)
PLATELETS: 79 10*3/uL — AB (ref 150–400)
RBC: 2.73 MIL/uL — ABNORMAL LOW (ref 4.22–5.81)
RDW: 13.6 % (ref 11.5–15.5)
WBC: 6.5 10*3/uL (ref 4.0–10.5)

## 2015-02-07 LAB — PROTIME-INR
INR: 1.18 (ref 0.00–1.49)
PROTHROMBIN TIME: 15.2 s (ref 11.6–15.2)

## 2015-02-07 SURGERY — INSERTION OF DIALYSIS CATHETER
Anesthesia: General | Site: Neck | Laterality: Right

## 2015-02-07 MED ORDER — ACD FORMULA A 0.73-2.45-2.2 GM/100ML VI SOLN
Status: AC
  Filled 2015-02-07: qty 500

## 2015-02-07 MED ORDER — 0.9 % SODIUM CHLORIDE (POUR BTL) OPTIME
TOPICAL | Status: DC | PRN
  Administered 2015-02-07: 1000 mL

## 2015-02-07 MED ORDER — HEPARIN SODIUM (PORCINE) 1000 UNIT/ML IJ SOLN
INTRAMUSCULAR | Status: DC | PRN
  Administered 2015-02-07: 5000 [IU]

## 2015-02-07 MED ORDER — SODIUM CHLORIDE 0.9 % IR SOLN
Status: DC | PRN
  Administered 2015-02-07: 500 mL

## 2015-02-07 MED ORDER — DEXTROSE 5 % IV SOLN
1.5000 g | INTRAVENOUS | Status: AC
  Administered 2015-02-07: 1.5 g via INTRAVENOUS
  Filled 2015-02-07: qty 1.5

## 2015-02-07 MED ORDER — ONDANSETRON HCL 4 MG/2ML IJ SOLN
INTRAMUSCULAR | Status: AC
  Filled 2015-02-07: qty 2

## 2015-02-07 MED ORDER — HYDROMORPHONE HCL 1 MG/ML IJ SOLN: 0.2500 mg | INTRAMUSCULAR | Status: DC | PRN

## 2015-02-07 MED ORDER — CALCIUM CARBONATE ANTACID 500 MG PO CHEW
2.0000 | CHEWABLE_TABLET | ORAL | Status: DC
  Administered 2015-02-07: 400 mg via ORAL
  Filled 2015-02-07 (×2): qty 2

## 2015-02-07 MED ORDER — HEPARIN SODIUM (PORCINE) 1000 UNIT/ML IJ SOLN
INTRAMUSCULAR | Status: AC
  Filled 2015-02-07: qty 1

## 2015-02-07 MED ORDER — ONDANSETRON HCL 4 MG/2ML IJ SOLN
INTRAMUSCULAR | Status: DC | PRN
  Administered 2015-02-07: 4 mg via INTRAVENOUS

## 2015-02-07 MED ORDER — CALCIUM CARBONATE ANTACID 500 MG PO CHEW
CHEWABLE_TABLET | ORAL | Status: AC
  Administered 2015-02-07: 400 mg via ORAL
  Filled 2015-02-07: qty 2

## 2015-02-07 MED ORDER — PROMETHAZINE HCL 25 MG/ML IJ SOLN
INTRAMUSCULAR | Status: AC
  Filled 2015-02-07: qty 1

## 2015-02-07 MED ORDER — ACETAMINOPHEN 325 MG PO TABS
650.0000 mg | ORAL_TABLET | ORAL | Status: DC | PRN
Start: 2015-02-07 — End: 2015-02-07

## 2015-02-07 MED ORDER — CALCIUM GLUCONATE 10 % IV SOLN
4.0000 g | Freq: Once | INTRAVENOUS | Status: AC
  Administered 2015-02-07: 4 g via INTRAVENOUS
  Filled 2015-02-07 (×2): qty 40

## 2015-02-07 MED ORDER — FENTANYL CITRATE (PF) 250 MCG/5ML IJ SOLN
INTRAMUSCULAR | Status: AC
  Filled 2015-02-07: qty 5

## 2015-02-07 MED ORDER — SODIUM CHLORIDE 0.9 % IV SOLN
INTRAVENOUS | Status: DC
  Administered 2015-02-07 (×3): via INTRAVENOUS

## 2015-02-07 MED ORDER — DIPHENHYDRAMINE HCL 25 MG PO CAPS: 25.0000 mg | ORAL_CAPSULE | Freq: Four times a day (QID) | ORAL | Status: DC | PRN

## 2015-02-07 MED ORDER — PHENYLEPHRINE HCL 10 MG/ML IJ SOLN
10.0000 mg | INTRAVENOUS | Status: DC | PRN
  Administered 2015-02-07: 40 ug/min via INTRAVENOUS

## 2015-02-07 MED ORDER — ACD FORMULA A 0.73-2.45-2.2 GM/100ML VI SOLN
500.0000 mL | Status: DC
  Filled 2015-02-07: qty 500

## 2015-02-07 MED ORDER — OXYCODONE HCL 5 MG PO TABS
5.0000 mg | ORAL_TABLET | ORAL | Status: DC | PRN
  Administered 2015-02-07 (×2): 5 mg via ORAL
  Filled 2015-02-07: qty 1

## 2015-02-07 MED ORDER — FENTANYL CITRATE (PF) 100 MCG/2ML IJ SOLN
INTRAMUSCULAR | Status: DC | PRN
  Administered 2015-02-07: 100 ug via INTRAVENOUS

## 2015-02-07 MED ORDER — LIDOCAINE-EPINEPHRINE 0.5 %-1:200000 IJ SOLN
INTRAMUSCULAR | Status: AC
  Filled 2015-02-07: qty 1

## 2015-02-07 MED ORDER — ANTICOAGULANT SODIUM CITRATE 4% (200MG/5ML) IV SOLN
5.0000 mL | Freq: Once | Status: DC
  Filled 2015-02-07: qty 250

## 2015-02-07 MED ORDER — LIDOCAINE HCL (CARDIAC) 20 MG/ML IV SOLN
INTRAVENOUS | Status: DC | PRN
  Administered 2015-02-07: 100 mg via INTRAVENOUS

## 2015-02-07 MED ORDER — PROMETHAZINE HCL 25 MG/ML IJ SOLN
6.2500 mg | INTRAMUSCULAR | Status: DC | PRN
  Administered 2015-02-07: 6.25 mg via INTRAVENOUS

## 2015-02-07 MED ORDER — OXYCODONE HCL 5 MG PO TABS
ORAL_TABLET | ORAL | Status: AC
  Filled 2015-02-07: qty 1

## 2015-02-07 MED ORDER — PROPOFOL 10 MG/ML IV BOLUS
INTRAVENOUS | Status: DC | PRN
  Administered 2015-02-07: 130 mg via INTRAVENOUS

## 2015-02-07 SURGICAL SUPPLY — 59 items
APL SKNCLS STERI-STRIP NONHPOA (GAUZE/BANDAGES/DRESSINGS) ×3
ARMBAND PINK RESTRICT EXTREMIT (MISCELLANEOUS) ×5 IMPLANT
BAG DECANTER FOR FLEXI CONT (MISCELLANEOUS) ×5 IMPLANT
BENZOIN TINCTURE PRP APPL 2/3 (GAUZE/BANDAGES/DRESSINGS) ×5 IMPLANT
BIOPATCH RED 1 DISK 7.0 (GAUZE/BANDAGES/DRESSINGS) ×4 IMPLANT
BIOPATCH RED 1IN DISK 7.0MM (GAUZE/BANDAGES/DRESSINGS) ×1
CANISTER SUCTION 2500CC (MISCELLANEOUS) ×5 IMPLANT
CANNULA VESSEL 3MM 2 BLNT TIP (CANNULA) ×5 IMPLANT
CATH CANNON HEMO 15F 50CM (CATHETERS) IMPLANT
CATH CANNON HEMO 15FR 19 (HEMODIALYSIS SUPPLIES) IMPLANT
CATH CANNON HEMO 15FR 23CM (HEMODIALYSIS SUPPLIES) IMPLANT
CATH CANNON HEMO 15FR 31CM (HEMODIALYSIS SUPPLIES) IMPLANT
CATH CANNON HEMO 15FR 32CM (HEMODIALYSIS SUPPLIES) ×5 IMPLANT
CLIP LIGATING EXTRA MED SLVR (CLIP) ×5 IMPLANT
CLIP LIGATING EXTRA SM BLUE (MISCELLANEOUS) ×5 IMPLANT
CLOSURE STERI-STRIP 1/2X4 (GAUZE/BANDAGES/DRESSINGS) ×1
CLOSURE WOUND 1/2 X4 (GAUZE/BANDAGES/DRESSINGS) ×1
CLSR STERI-STRIP ANTIMIC 1/2X4 (GAUZE/BANDAGES/DRESSINGS) ×4 IMPLANT
COVER PROBE W GEL 5X96 (DRAPES) ×5 IMPLANT
DECANTER SPIKE VIAL GLASS SM (MISCELLANEOUS) ×5 IMPLANT
DRAPE C-ARM 42X72 X-RAY (DRAPES) ×5 IMPLANT
DRAPE CHEST BREAST 15X10 FENES (DRAPES) ×5 IMPLANT
DRSG TEGADERM 2-3/8X2-3/4 SM (GAUZE/BANDAGES/DRESSINGS) ×5 IMPLANT
ELECT REM PT RETURN 9FT ADLT (ELECTROSURGICAL) ×5
ELECTRODE REM PT RTRN 9FT ADLT (ELECTROSURGICAL) ×3 IMPLANT
GAUZE SPONGE 2X2 8PLY STRL LF (GAUZE/BANDAGES/DRESSINGS) ×3 IMPLANT
GAUZE SPONGE 4X4 12PLY STRL (GAUZE/BANDAGES/DRESSINGS) ×5 IMPLANT
GAUZE SPONGE 4X4 16PLY XRAY LF (GAUZE/BANDAGES/DRESSINGS) ×5 IMPLANT
GEL ULTRASOUND 20GR AQUASONIC (MISCELLANEOUS) IMPLANT
GLOVE SS BIOGEL STRL SZ 7.5 (GLOVE) ×3 IMPLANT
GLOVE SUPERSENSE BIOGEL SZ 7.5 (GLOVE) ×2
GOWN STRL REUS W/ TWL LRG LVL3 (GOWN DISPOSABLE) ×9 IMPLANT
GOWN STRL REUS W/TWL LRG LVL3 (GOWN DISPOSABLE) ×12
KIT BASIN OR (CUSTOM PROCEDURE TRAY) ×5 IMPLANT
KIT ROOM TURNOVER OR (KITS) ×5 IMPLANT
LIQUID BAND (GAUZE/BANDAGES/DRESSINGS) ×5 IMPLANT
NEEDLE 18GX1X1/2 (RX/OR ONLY) (NEEDLE) ×5 IMPLANT
NEEDLE 22X1 1/2 (OR ONLY) (NEEDLE) ×5 IMPLANT
NEEDLE HYPO 25GX1X1/2 BEV (NEEDLE) ×5 IMPLANT
NS IRRIG 1000ML POUR BTL (IV SOLUTION) ×5 IMPLANT
PACK CV ACCESS (CUSTOM PROCEDURE TRAY) ×5 IMPLANT
PACK SURGICAL SETUP 50X90 (CUSTOM PROCEDURE TRAY) ×5 IMPLANT
PAD ARMBOARD 7.5X6 YLW CONV (MISCELLANEOUS) ×10 IMPLANT
SOAP 2 % CHG 4 OZ (WOUND CARE) ×5 IMPLANT
SPONGE GAUZE 2X2 STER 10/PKG (GAUZE/BANDAGES/DRESSINGS) ×2
SPONGE GAUZE 4X4 12PLY STER LF (GAUZE/BANDAGES/DRESSINGS) ×10 IMPLANT
STRIP CLOSURE SKIN 1/2X4 (GAUZE/BANDAGES/DRESSINGS) ×4 IMPLANT
SUT ETHILON 3 0 PS 1 (SUTURE) ×5 IMPLANT
SUT PROLENE 6 0 CC (SUTURE) ×5 IMPLANT
SUT VIC AB 3-0 SH 27 (SUTURE) ×4
SUT VIC AB 3-0 SH 27X BRD (SUTURE) ×3 IMPLANT
SUT VICRYL 4-0 PS2 18IN ABS (SUTURE) ×5 IMPLANT
SYR 20CC LL (SYRINGE) ×5 IMPLANT
SYR 5ML LL (SYRINGE) ×10 IMPLANT
SYR CONTROL 10ML LL (SYRINGE) ×5 IMPLANT
SYRINGE 10CC LL (SYRINGE) ×5 IMPLANT
TAPE CLOTH SURG 4X10 WHT LF (GAUZE/BANDAGES/DRESSINGS) ×10 IMPLANT
UNDERPAD 30X30 INCONTINENT (UNDERPADS AND DIAPERS) ×5 IMPLANT
WATER STERILE IRR 1000ML POUR (IV SOLUTION) ×5 IMPLANT

## 2015-02-07 NOTE — Progress Notes (Signed)
[facility] TEAM 1 - Stepdown/ICU TEAM Progress Note  Justin Woods HMC:[id number] DOB: [birth year] DOA: 01/30/2015 PCP: Wenda Low, MD  Admit HPI / Brief Narrative: 81yo male with history of HTN and renal stones who went to see his PCP c/o fatigue with decreased urine output.  Labs noted creatinine of 12.6, w/ baseline at PCP office in October 2015 of 1.17.  CT abdomen and pelvis did not show evidence of renal stones or hydronephrosis, only mild pre-ureteral stranding.  Patient was seen by Urology Dr.Herrick and had Foley catheter inserted with minimal urine output (total 150 mL).  HPI/Subjective: The patient is sedate after returning from the OR for his vascular surgery.  His respirations are calm and he appears comfortable.  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 - awaiting follow-up visit from Oncology  Hyperkalemia - resolved Due to acute renal failure - corrected with hemodialysis  Acute hypoxic respiratory failure - pulmonary edema - resolved pulmonary edema related to acute renal failure - resolved with ongoing dialysis - wean to room air as able  Anemia Due to kidney failure + MM - no indication for epo or iron at this time  Hypertension Blood pressure reasonably controlled - follow without change in treatment plan   Prior history of DVT during hospitalization Unable to use anticoagulation of any type for 2 weeks post renal biopsy  COPD No evidence of acute exacerbation at present time  Chronic arthritis Denies focal joint pain at this time  Code Status: FULL Family Communication: Spoke with wife at  length at bedside Disposition Plan: SDU - possible transfer to renal floor in 24/48 hours  Consultants: Nephrology  Hematology/Oncology  Procedures: 6/22 insertion L IJ temporary hemodialysis catheter 6/22 HD initiated  6/24 kidney bx 6/25 plasmapheresis initiated 6/27 BMbx 6/29 R IJ tunneled HD cath + L radiocephalic AV fistula   Antibiotics: None   DVT prophylaxis: SCDs (post renal bx)  Objective: Blood pressure 128/57, pulse 60, temperature 98.6 F (37 C), temperature source Oral, resp. rate 17, height 5\' 6"  (1.676 m), weight 81.6 kg (179 lb 14.3 oz), SpO2 98 %.  Intake/Output Summary (Last 24 hours) at 02/07/15 1426 Last data filed at 02/07/15 1200  Gross per 24 hour  Intake    823 ml  Output     10 ml  Net    813 ml    Exam: General: Respirations appear comfortable - sedate post OR  Lungs: No appreciable crackles or wheezes - good air movement th/o  Cardiovascular: Regular rate & rhythm without gallop or rub or M Abdomen: Nontender, mildly protuberant, soft, bowel sounds positive, no rebound, no ascites, no mass Extremities: No significant cyanosis, or clubbing, only trace edema bilateral lower extremities  Data Reviewed: Basic Metabolic Panel:  Recent Labs Lab 02/02/15 0454 02/03/15 0550  02/04/15 0455  02/05/15 0602 02/05/15 1425 02/06/15 0810 02/06/15 1432 02/07/15 0307  NA 137  136 138  < > 138  < > 140 138 140 139 142  K 4.2  4.2 4.5  < > 4.4  < > 4.0 3.8 4.2 3.8 4.0  CL 98*  97* 97*  < > 97*  < >  97* 93* 95* 98* 99*  CO2 25  24 22   --  27  --  29  --  29  --  29  GLUCOSE 210*  208* 172*  < > 167*  < > 97 136* 90 116* 96  BUN 47*  48* 89*  < > 63*  < > 97* 110* 117* 44* 58*  CREATININE 9.49*  9.48* 12.39*  < > 8.17*  < > 10.53* 11.40* 12.38* 7.10* 8.35*  CALCIUM 8.1*  8.1* 8.6*  --  8.7*  --  8.3*  --  8.3*  --  8.0*  MG 2.1  --   --   --   --   --   --   --   --   --   PHOS 6.5* 9.1*  --  9.6*  --  11.7*  --  13.6*  --   --   < > =  values in this interval not displayed.  CBC:  Recent Labs Lab 02/03/15 0550  02/04/15 0455  02/05/15 0557 02/05/15 1425 02/06/15 0811 02/06/15 1432 02/07/15 0307  WBC 14.7*  --  12.2*  --  11.1*  --  8.4  --  6.5  NEUTROABS  --   --   --   --   --   --  4.9  --   --   HGB 10.1*  < > 10.3*  < > 9.8* 9.5* 9.6* 9.5* 9.1*  HCT 28.4*  < > 28.9*  < > 27.7* 28.0* 27.1* 28.0* 25.6*  MCV 92.5  --  91.7  --  92.0  --  93.4  --  93.8  PLT 128*  --  99*  --  96*  --  93*  --  79*  < > = values in this interval not displayed.  Liver Function Tests:  Recent Labs Lab 02/02/15 0454 02/03/15 0550 02/04/15 0455 02/05/15 0602 02/06/15 0810  AST 29  --   --   --   --   ALT 19  --   --   --   --   ALKPHOS 71  --   --   --   --   BILITOT 0.9  --   --   --   --   PROT 5.9*  --   --   --   --   ALBUMIN 3.0*  3.0* 2.9* 2.9* 2.7* 2.7*    Recent Results (from the past 240 hour(s))  MRSA PCR Screening     Status: None   Collection Time: 01/30/15  7:01 PM  Result Value Ref Range Status   MRSA by PCR NEGATIVE NEGATIVE Final    Comment:        The GeneXpert MRSA Assay (FDA approved for NASAL specimens only), is one component of a comprehensive MRSA colonization surveillance program. It is not intended to diagnose MRSA infection nor to guide or monitor treatment for MRSA infections.      Studies:   Recent x-ray studies have been reviewed in detail by the Attending Physician  Scheduled Meds:  Scheduled Meds: . anticoagulant sodium citrate  5 mL Intracatheter Once  . antiseptic oral rinse  7 mL Mouth Rinse BID  . atenolol  25 mg Oral QHS  . budesonide-formoterol  2 puff Inhalation BID  . calcium carbonate  2 tablet Oral Q3H  . calcium gluconate IVPB  4 g Intravenous Once  . fluticasone  1 spray Each Nare Daily  . oxyCODONE      .  promethazine      . sodium chloride  3 mL Intravenous Q12H    Time spent on care of this patient: 25 mins   [facility] , MD   [facility] Office  [phone] Pager - Text Page per Shea Evans as per below:  On-Call/Text Page:      [email or web address]      password [email or web address]  If 7PM-7AM, please contact night-coverage [email or web address] Password [email or web address] 02/07/2015, 2:26 PM   LOS: 8 days

## 2015-02-07 NOTE — Interval H&P Note (Signed)
History and Physical Interval Note:  02/07/2015 9:46 AM  Hughes Better  has presented today for surgery, with the diagnosis of Acute renal insufficiency N28.9  The various methods of treatment have been discussed with the patient and family. After consideration of risks, benefits and other options for treatment, the patient has consented to  Procedure(s): INSERTION OF DIALYSIS CATHETER (N/A) ARTERIOVENOUS (AV) FISTULA CREATION (Left) as a surgical intervention .  The patient's history has been reviewed, patient examined, no change in status, stable for surgery.  I have reviewed the patient's chart and labs.  Questions were answered to the patient's satisfaction.     Curt Jews

## 2015-02-07 NOTE — Op Note (Signed)
    OPERATIVE REPORT  DATE OF SURGERY: 02/07/2015  PATIENT: Justin Woods, 81 y.o. male MRN: [id number]  DOB: 08-11-1931  PRE-OPERATIVE DIAGNOSIS:  End-stage renal disease  POST-OPERATIVE DIAGNOSIS:  Same  PROCEDURE:  Right IJ tunneled hemodialysis catheter with SonoSite visualization  Left radiocephalic Cimino AV fistula  SURGEON:  Curt Jews, M.D.  PHYSICIAN ASSISTANT:  Collins  ANESTHESIA:   L MA  EBL:  minimal ml  Total I/O In: 500 [I.V.:500] Out: 10 [Emesis/NG output:10]  BLOOD ADMINISTERED:  none  DRAINS:  none  SPECIMEN:  none  COUNTS CORRECT:  YES  PLAN OF CARE:   PACU with chest x-ray pending  PATIENT DISPOSITION:  PACU - hemodynamically stable  PROCEDURE DETAILS:  patient was taken up replacing the right neck and chest were prepped and draped in sterile fashion. Using SonoSite ultrasound an 18-gauge needle was used to access the internal jugular vein and a guidewire was passed over the level the right atrium. This was confirmed with fluoroscopy. Exudative dilator and peel-away sheath were passed over the guidewire and the dilator and guidewire removed. A 27 cm vitals catheter was passed through the peel-away sheath and the peel-away sheath was removed. The catheter tips were placed the level of the distal right atrium. The catheter was then brought through a separate subcutaneous tunnel through a separate stab incision. The 2 lm ports were attached in both lumens flushed and aspirated easily. The catheter was secured to the skin with a 3-0 nylon stitch and the entry site was closed with a 4 subcuticular Monocryl suture.    next the left arm was prepped and draped in usual sterile fashion. SonoSite ultrasound was used to visualize the cephalic vein which was of good caliber at the wrist. An incision was made between the level of the cephalic vein and the radial artery. Cephalic vein was mobilized proximally and distally. The vein was  Divided distally and was  gently dilated was of good caliber. The radial artery was occluded proximally and distally and was opened with 11 blade incision longitudinally with Potts scissors. The vein was cut to appropriate length and was sewn end-to-side to the artery with a running 60 proline suture. Clamps removed and good thrill was noted. The wounds were irrigated with saline. Hemostasis obtained left cautery. The wounds were closed with 30 Vicryls in the subcutaneous and subcuticular tissue.   finally the left IJ temporary catheter was removed and pressure was held for hemostasis   Curt Jews, M.D. 02/07/2015 1:11 PM

## 2015-02-07 NOTE — Progress Notes (Signed)
1. AKI - Oliguric AKI .  Renal biopsy with ATN and myeloma cast  Nephropathy.  For TPE # 6 today.  HD & TPE Thursday 2. Multiple myeloma 3.  S/P L Cimino 4   R IJ PC  Subjective: Interval History:S/P AV access today  Objective: Vital signs in last 24 hours: Temp:  [97.3 F (36.3 Woods)-98.6 F (37 Woods)] 98.6 F (37 Woods) (06/29 1145) Pulse Rate:  [60-84] 60 (06/29 1315) Resp:  [9-22] 17 (06/29 1315) BP: (103-162)/(50-71) 128/57 mmHg (06/29 1245) SpO2:  [92 %-99 %] 98 % (06/29 1315) Weight:  [81.6 kg (179 lb 14.3 oz)] 81.6 kg (179 lb 14.3 oz) (06/29 0445) Weight change: 0.6 kg (1 lb 5.2 oz)  Intake/Output from previous day: 06/28 0701 - 06/29 0700 In: 323 [P.O.:270; I.V.:3; IV Piggyback:50] Out: 908  Intake/Output this shift: Total I/O In: 500 [I.V.:500] Out: 10 [Emesis/NG output:10]  General appearance: sleepy post op Extremities: edema none  AVF left wrist  Left IJ PC  Lab Results:  Recent Labs  02/06/15 0811 02/06/15 1432 02/07/15 0307  WBC 8.4  --  6.5  HGB 9.6* 9.5* 9.1*  HCT 27.1* 28.0* 25.6*  PLT 93*  --  79*   BMET:  Recent Labs  02/06/15 0810 02/06/15 1432 02/07/15 0307  NA 140 139 142  K 4.2 3.8 4.0  CL 95* 98* 99*  CO2 29  --  29  GLUCOSE 90 116* 96  BUN 117* 44* 58*  CREATININE 12.38* 7.10* 8.35*  CALCIUM 8.3*  --  8.0*   No results for input(s): PTH in the last 72 hours. Iron Studies: No results for input(s): IRON, TIBC, TRANSFERRIN, FERRITIN in the last 72 hours. Studies/Results: Dg Chest Port 1 View  02/07/2015   CLINICAL DATA:  Central line placement portable exam 1215 hours compared to 01/31/2015  EXAM: PORTABLE CHEST - 1 VIEW  COMPARISON:  None.  FINDINGS: RIGHT jugular central venous catheter with tips projecting over RIGHT atrium.  Enlargement of cardiac silhouette.  Mediastinal contours and pulmonary vascularity normal.  Atherosclerotic calcification aorta.  Bibasilar atelectasis.  No gross infiltrate, pleural effusion or pneumothorax.  No  acute osseous findings.  IMPRESSION: No pneumothorax following central line placement.  Bibasilar atelectasis.   Electronically Signed   By: Lavonia Dana M.D.   On: 02/07/2015 12:22    Scheduled: . anticoagulant sodium citrate  5 mL Intracatheter Once  . antiseptic oral rinse  7 mL Mouth Rinse BID  . atenolol  25 mg Oral QHS  . budesonide-formoterol  2 puff Inhalation BID  . calcium carbonate  2 tablet Oral Q3H  . calcium gluconate IVPB  4 g Intravenous Once  . fluticasone  1 spray Each Nare Daily  . oxyCODONE      . promethazine      . sodium chloride  3 mL Intravenous Q12H    LOS: 8 days   Justin Woods 02/07/2015,3:19 PM

## 2015-02-07 NOTE — Progress Notes (Signed)
Pt TPE ended with no complications. Pt resting, vss. Report called to primary RN. Patient returned safely to room. CCMD notified.

## 2015-02-07 NOTE — Progress Notes (Signed)
PT Cancellation Note  Patient Details Name: Justin Woods MRN: [id number] DOB: Mar 12, 1931   Cancelled Treatment:    Reason Eval/Treat Not Completed: Patient at procedure or test/unavailable (Still in PACU. Will check back tomorrow.  thanks. )   Irwin Brakeman F 02/07/2015, 1:15 PM  [facility] [phone] (pager)

## 2015-02-07 NOTE — Progress Notes (Signed)
PT Cancellation Note  Patient Details Name: Justin Woods MRN: [id number] DOB: [birth year]   Cancelled Treatment:    Reason Eval/Treat Not Completed: Medical issues which prohibited therapy (Pt gettting catheter placed.  Will check back in pm. thanksl)   Juriel Cid, Arrie Aran F 02/07/2015, 9:49 AM [facility] [phone] (pager)

## 2015-02-07 NOTE — Anesthesia Postprocedure Evaluation (Signed)
Anesthesia Post Note  Patient: Justin Woods  Procedure(s) Performed: Procedure(s) (LRB): INSERTION OF DIALYSIS CATHETER (Right) ARTERIOVENOUS (AV) FISTULA CREATION (Left) Removal of  Temporary catheter in left neck (Left)  Anesthesia type: general  Patient location: PACU  Post pain: Pain level controlled  Post assessment: Patient's Cardiovascular Status Stable  Last Vitals:  Filed Vitals:   02/07/15 1315  BP:   Pulse: 60  Temp:   Resp: 17    Post vital signs: Reviewed and stable  Level of consciousness: sedated  Complications: No apparent anesthesia complications

## 2015-02-07 NOTE — H&P (View-Only) (Signed)
Patient name: Justin Woods MRN: [id number] DOB: 10-Mar-1931 Sex: male   Referred by: Florene Glen  Reason for referral:  Chief Complaint  Patient presents with  . high creatinine     HISTORY OF PRESENT ILLNESS: She is very pleasant 81 year old gentleman who had normal renal function. He presented with end-stage renal disease with generalized malaise. Was admitted and had a temporary catheter placed in his left internal jugular vein and this had hemodialysis through this. He is just finished hemodialysis and is still in the unit. No difficulty today with his acute catheter. We are seeing him for discussion of long term access. He is scheduled for hemodialysis again tomorrow  Past Medical History  Diagnosis Date  . Hypertension   . COPD (chronic obstructive pulmonary disease)   . Asthma   . Chronic kidney disease   . History of kidney stones   . History of hiatal hernia   . Headache     HISTORY OF CLUSTER HEADACHES  . Arthritis   . Complication of anesthesia     TROUBLE WAKING UP  . H/O colonoscopy with polypectomy   . Allergic rhinitis   . DVT (deep venous thrombosis)     RIGHT CALF AFTER A LITHOTRIPSY  . BPH (benign prostatic hypertrophy)   . Osteoarthritis     Past Surgical History  Procedure Laterality Date  . Lithotripsy    . Back surgery    . Arthroscopy right knee    . Cholecystectomy    . Skin cancer removed from ears      History   Social History  . Marital Status: Married    Spouse Name: N/A  . Number of Children: N/A  . Years of Education: N/A   Occupational History  . Not on file.   Social History Main Topics  . Smoking status: Former Smoker -- 2.00 packs/day    Types: Cigarettes  . Smokeless tobacco: Never Used  . Alcohol Use: No  . Drug Use: No  . Sexual Activity: No   Other Topics Concern  . Not on file   Social History Narrative    Family History  Problem Relation Age of Onset  . Hypertension Mother   . Hypertension Father      Allergies as of 01/30/2015  . (No Known Allergies)    No current facility-administered medications on file prior to encounter.   No current outpatient prescriptions on file prior to encounter.     REVIEW OF SYSTEMS: Reviewed in his history and physical with nothing to add  PHYSICAL EXAMINATION:  General: The patient is a well-nourished male, in no acute distress. Vital signs are BP 115/68 mmHg  Pulse 78  Temp(Src) 98 F (36.7 C) (Oral)  Resp 18  Ht 5\' 6"  (1.676 m)  Wt 203 lb 11.3 oz (92.4 kg)  BMI 32.89 kg/m2  SpO2 94% Pulmonary: There is a good air exchange  Abdomen: Soft and non-tender  Musculoskeletal: There are no major deformities.  There is no significant extremity pain. Neurologic: No focal weakness or paresthesias are detected, Skin: There are no ulcer or rashes noted. Psychiatric: The patient has normal affect. Cardiovascular: 2+ radial pulses bilaterally   Vascular Lab Studies:  Patent and greater than 3 mm cephalic veins bilaterally. Good size in both forearms and somewhat larger in the right upper arm  Impression and Plan:  I discussed the options for hemodialysis the patient. Explained limitations of acute catheter. Explained that tunneled catheter placement and also AV  graft and AV fistula. He appears to be an excellent candidate for AV fistula creation. Will restrict use of his left arm. Plan tunneled catheter and left arm access on Wednesday. He has both hemodialysis and plasmapheresis planned for tomorrow.    Curt Jews [facility] Office: [phone]

## 2015-02-07 NOTE — Progress Notes (Signed)
Subjective: The patient is seen and examined today. His wife was at the bedside. He was a little bit sleepy after receiving sedation during IV access placement. He has a bone marrow biopsy and aspirate performed on 03/07/2015 and the final pathology was consistent with plasma cell neoplasm with plasma cells representing 54% of all cells. The patient also has a skeletal bone survey that showed no significant lytic lesions. He is currently undergoing hemodialysis as well as plasmapheresis. He denied having any fever or chills, no nausea or vomiting.  Objective: Vital signs in last 24 hours: Temp:  [97.3 F (36.3 C)-98.6 F (37 C)] 98.1 F (36.7 C) (06/29 1620) Pulse Rate:  [60-84] 69 (06/29 1620) Resp:  [9-21] 21 (06/29 1620) BP: (104-162)/(49-71) 118/49 mmHg (06/29 1620) SpO2:  [90 %-99 %] 99 % (06/29 1620) Weight:  [179 lb 14.3 oz (81.6 kg)] 179 lb 14.3 oz (81.6 kg) (06/29 0445)  Intake/Output from previous day: 06/28 0701 - 06/29 0700 In: 323 [P.O.:270; I.V.:3; IV Piggyback:50] Out: 908  Intake/Output this shift: Total I/O In: 500 [I.V.:500] Out: 10 [Emesis/NG output:10]  General appearance: alert, cooperative, fatigued and no distress Resp: clear to auscultation bilaterally Cardio: regular rate and rhythm, S1, S2 normal, no murmur, click, rub or gallop GI: soft, non-tender; bowel sounds normal; no masses,  no organomegaly Extremities: extremities normal, atraumatic, no cyanosis or edema  Lab Results:   Recent Labs  02/06/15 0811 02/06/15 1432 02/07/15 0307  WBC 8.4  --  6.5  HGB 9.6* 9.5* 9.1*  HCT 27.1* 28.0* 25.6*  PLT 93*  --  79*   BMET  Recent Labs  02/06/15 0810 02/06/15 1432 02/07/15 0307  NA 140 139 142  K 4.2 3.8 4.0  CL 95* 98* 99*  CO2 29  --  29  GLUCOSE 90 116* 96  BUN 117* 44* 58*  CREATININE 12.38* 7.10* 8.35*  CALCIUM 8.3*  --  8.0*    Studies/Results: Dg Chest Port 1 View  02/07/2015   CLINICAL DATA:  Central line placement portable exam  1215 hours compared to 01/31/2015  EXAM: PORTABLE CHEST - 1 VIEW  COMPARISON:  None.  FINDINGS: RIGHT jugular central venous catheter with tips projecting over RIGHT atrium.  Enlargement of cardiac silhouette.  Mediastinal contours and pulmonary vascularity normal.  Atherosclerotic calcification aorta.  Bibasilar atelectasis.  No gross infiltrate, pleural effusion or pneumothorax.  No acute osseous findings.  IMPRESSION: No pneumothorax following central line placement.  Bibasilar atelectasis.   Electronically Signed   By: Lavonia Dana M.D.   On: 02/07/2015 12:22    Medications: I have reviewed the patient's current medications.   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 The patient is currently undergoing hemodialysis as well as plasmapheresis. I had a lengthy discussion with the patient and his wife about his current condition and the bone marrow biopsy results.  I would consider the patient for systemic chemotherapy with subcutaneous Velcade and Decadron after discharge from the hospital later this week. I will arrange for the patient a follow-up appointment with me at the [facility] for more detailed discussion of his treatment options. Thank you for taking good care of Mr. Kindt, I will continue to follow up the patient with you and assist in his management on as-needed basis.  LOS: 8 days    Maycel Riffe K. 02/07/2015

## 2015-02-07 NOTE — Anesthesia Procedure Notes (Signed)
Procedure Name: LMA Insertion Date/Time: 02/07/2015 10:04 AM Performed by: Izora Gala Pre-anesthesia Checklist: Patient identified Patient Re-evaluated:Patient Re-evaluated prior to inductionOxygen Delivery Method: Circle system utilized Preoxygenation: Pre-oxygenation with 100% oxygen Intubation Type: IV induction LMA: LMA inserted LMA Size: 5.0 Number of attempts: 1 Placement Confirmation: positive ETCO2 Tube secured with: Tape

## 2015-02-07 NOTE — Transfer of Care (Signed)
Immediate Anesthesia Transfer of Care Note  Patient: Justin Woods  Procedure(s) Performed: Procedure(s): INSERTION OF DIALYSIS CATHETER (Right) ARTERIOVENOUS (AV) FISTULA CREATION (Left) Removal of  Temporary catheter in left neck (Left)  Patient Location: PACU  Anesthesia Type:General  Level of Consciousness: awake, alert , oriented and patient cooperative  Airway & Oxygen Therapy: Patient Spontanous Breathing and Patient connected to face mask oxygen  Post-op Assessment: Report given to RN, Post -op Vital signs reviewed and stable and Patient moving all extremities  Post vital signs: Reviewed and stable  Last Vitals:  Filed Vitals:   02/07/15 0851  BP:   Pulse: 66  Temp:   Resp: 16    Complications: No apparent anesthesia complications

## 2015-02-08 ENCOUNTER — Telehealth: Payer: Self-pay | Admitting: Vascular Surgery

## 2015-02-08 ENCOUNTER — Encounter (HOSPITAL_COMMUNITY): Payer: Self-pay | Admitting: Vascular Surgery

## 2015-02-08 LAB — THERAPEUTIC PLASMA EXCHANGE (BLOOD BANK)
PLASMA VOLUME NEEDED: 3250
UNIT DIVISION: 0
UNIT DIVISION: 0
UNIT DIVISION: 0
UNIT DIVISION: 0
UNIT DIVISION: 0
UNIT DIVISION: 0
Unit division: 0
Unit division: 0
Unit division: 0
Unit division: 0
Unit division: 0
Unit division: 0
Unit division: 0

## 2015-02-08 LAB — BETA 2 MICROGLOBULIN, SERUM: Beta-2 Microglobulin: 28.9 mg/L — ABNORMAL HIGH (ref 0.6–2.4)

## 2015-02-08 LAB — POCT I-STAT, CHEM 8
BUN: 18 mg/dL (ref 6–20)
Calcium, Ion: 0.92 mmol/L — ABNORMAL LOW (ref 1.13–1.30)
Chloride: 96 mmol/L — ABNORMAL LOW (ref 101–111)
Creatinine, Ser: 3.7 mg/dL — ABNORMAL HIGH (ref 0.61–1.24)
GLUCOSE: 82 mg/dL (ref 65–99)
HEMATOCRIT: 22 % — AB (ref 39.0–52.0)
Hemoglobin: 7.5 g/dL — ABNORMAL LOW (ref 13.0–17.0)
POTASSIUM: 3.1 mmol/L — AB (ref 3.5–5.1)
SODIUM: 137 mmol/L (ref 135–145)
TCO2: 29 mmol/L (ref 0–100)

## 2015-02-08 MED ORDER — ACYCLOVIR 200 MG PO CAPS
200.0000 mg | ORAL_CAPSULE | Freq: Two times a day (BID) | ORAL | Status: DC
  Administered 2015-02-08 – 2015-02-16 (×16): 200 mg via ORAL
  Filled 2015-02-08 (×19): qty 1

## 2015-02-08 MED ORDER — DEXAMETHASONE 6 MG PO TABS
40.0000 mg | ORAL_TABLET | ORAL | Status: DC
  Administered 2015-02-09 – 2015-02-16 (×2): 40 mg via ORAL
  Filled 2015-02-08 (×4): qty 1

## 2015-02-08 MED ORDER — DIPHENHYDRAMINE HCL 25 MG PO CAPS: 25.0000 mg | ORAL_CAPSULE | Freq: Four times a day (QID) | ORAL | Status: DC | PRN

## 2015-02-08 MED ORDER — ACD FORMULA A 0.73-2.45-2.2 GM/100ML VI SOLN
500.0000 mL | Status: DC
  Filled 2015-02-08: qty 500

## 2015-02-08 MED ORDER — SODIUM CHLORIDE 0.9 % IV SOLN
4.0000 g | Freq: Once | INTRAVENOUS | Status: AC
  Administered 2015-02-08: 4 g via INTRAVENOUS
  Filled 2015-02-08 (×2): qty 40

## 2015-02-08 MED ORDER — ONDANSETRON HCL 8 MG PO TABS
8.0000 mg | ORAL_TABLET | Freq: Once | ORAL | Status: AC
  Administered 2015-02-08: 8 mg via ORAL
  Filled 2015-02-08: qty 2
  Filled 2015-02-08: qty 1

## 2015-02-08 MED ORDER — BORTEZOMIB CHEMO SQ INJECTION 3.5 MG (2.5MG/ML)
1.3000 mg/m2 | Freq: Once | INTRAMUSCULAR | Status: DC
  Filled 2015-02-08: qty 1

## 2015-02-08 MED ORDER — CALCIUM CARBONATE ANTACID 500 MG PO CHEW
2.0000 | CHEWABLE_TABLET | ORAL | Status: AC
  Filled 2015-02-08 (×2): qty 2

## 2015-02-08 MED ORDER — ANTICOAGULANT SODIUM CITRATE 4% (200MG/5ML) IV SOLN
5.0000 mL | Freq: Once | Status: AC
  Administered 2015-02-08: 5 mL
  Filled 2015-02-08: qty 250

## 2015-02-08 MED ORDER — ACETAMINOPHEN 325 MG PO TABS: 650.0000 mg | ORAL_TABLET | ORAL | Status: DC | PRN

## 2015-02-08 NOTE — Progress Notes (Signed)
[place] TEAM 1 - Stepdown/ICU TEAM Progress Note  Justin Woods LKG:[id number] DOB: [birth year] DOA: 01/30/2015 PCP: Wenda Low, MD  Admit HPI / Brief Narrative: 81yo WM PMHx COPD, HTN, CTD, Nephrolithiasis, DVT right calf. Went to see his PCP c/o fatigue with decreased urine output. Labs noted creatinine of 12.6, w/ baseline at PCP office in October 2015 of 1.17. CT abdomen and pelvis did not show evidence of renal stones or hydronephrosis, only mild pre-ureteral stranding. Patient was seen by Urology Dr.Herrick and had Foley catheter inserted with minimal urine output (total 150 mL).   HPI/Subjective: 6/30 A/O 4, sitting comfortably in chair, negative SOB, negative N/V.   Assessment/Plan: Acute renal failure/ESRD on HD? (Baseline creatinine 1.27 May 2014) - Per Dr. Worthy Flank note (oncology), multiple myeloma confirmed by pathology. -patient's Cr has significantly improved with HD and plasmapheresis.  -Progressive surgery patient scheduled for tunnel catheter on left arm on 6/29 -Patient complete 7 days of HD and then transfer to [facility] for chemotherapy. -Patient will continue HD per nephrology. 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 -Will continue plasmapheresis per oncology. -Per Dr. Lorna Few (oncology) after patient completes week of HD/plasmapheresis will be transferred to [facility] for treatment with Velcade and Decadron.   Hyperkalemia -Resolved, monitor closely   Acute hypoxic respiratory failure - pulmonary edema -Resolved patient currently on room air   -Strict in and out since admission -5.1 L -Echocardiogram; mild pulmonary hypertension, see results below  Pulmonary hypertension -Mild, would just monitor at this point.  Anemia -Likely related to kidney failure + MM - no indication for epo or  iron at this time  Hypertension -Blood pressure reasonably controlled at present time   Prior history of DVT during hospitalization -Unable to use anticoagulation of any type for 2 weeks post renal biopsy  COPD -No evidence of acute exacerbation at present time  Chronic arthritis -Denies focal joint pain at this time    Code Status: FULL Family Communication: Wife present at time of exam Disposition Plan: Per nephrology    Consultants: Dr.Wesam Kathryne Sharper (PCCM) Dr.Cynthia Lorrene Reid (nephrology) Dr.Herrick (urology) Dr.Mohamed [facility] (oncology) Dr.Todd F Early (vascular surgery)   Procedure/Significant Events: 6/22 urine was positive for lambda light chains and M spike 6/22 HD initiated 6/24 echocardiogram;- LVEF=55%- 60%. -(grade 1 diastolicdysfunction). - Pulmonary arteries: PA peak pressure: 39 mm Hg (S). 6/24 kidney biopsy pending 6/27 bone marrow biopsy; atypical plasma cells representing 54% of all cells (kappa light chain restricted consistent with plasma cell neoplasm) 6/29 R IJ tunneled HD cath + L radiocephalic AV fistula    Culture 6/21 MRSA by PCR negative   Antibiotics:   DVT prophylaxis: SCDs    Devices    LINES / TUBES:  Lt IJ HD cath 6/22>>    Continuous Infusions: . sodium chloride 10 mL/hr at 02/07/15 0936  . citrate dextrose      Objective: VITAL SIGNS: Temp: 98.3 F (36.8 C) (06/30 0400) Temp Source: Oral (06/29 2057) BP: 110/46 mmHg (06/30 0400) Pulse Rate: 63 (06/30 0400) SPO2; FIO2:   Intake/Output Summary (Last 24 hours) at 02/08/15 0805 Last data filed at 02/07/15 1200  Gross per 24 hour  Intake    500 ml  Output     10 ml  Net    490 ml     Exam: General: A/O 4, NAD, sitting in chair comfortably.  Eyes: Negative headache, eye pain, double vision, negative retinal hemorrhage ENT: Negative Runny nose, negative ear pain, negative tinnitus, negative gingival bleeding Neck:  Negative scars, masses,  torticollis, lymphadenopathy, negative JVD, left IJ in place negative sign of infection covered and clean. Lungs: Clear to auscultation bilaterally,negative wheezing Cardiovascular: Regular rate and rhythm without murmur gallop or rub normal S1 and S2 Abdomen:negative abdominal pain, negative dysphagia, Nontender, nondistended, soft, bowel sounds positive, no rebound, no ascites, no appreciable mass Extremities: No significant cyanosis, clubbing, or edema bilateral lower extremities Psychiatric:  Negative depression, negative anxiety, negative fatigue, negative mania  Neurologic:  Cranial nerves II through XII intact, tongue/uvula midline, all extremities muscle strength 5/5, sensation intact throughout, negative dysarthria, negative expressive aphasia, negative receptive aphasia.     Data Reviewed: Basic Metabolic Panel:  Recent Labs Lab 02/02/15 0454 02/03/15 0550  02/04/15 0455  02/05/15 0602 02/05/15 1425 02/06/15 0810 02/06/15 1432 02/07/15 0307  NA 137  136 138  < > 138  < > 140 138 140 139 142  K 4.2  4.2 4.5  < > 4.4  < > 4.0 3.8 4.2 3.8 4.0  CL 98*  97* 97*  < > 97*  < > 97* 93* 95* 98* 99*  CO2 $Re'25  24 22  'fTY$ --  27  --  29  --  29  --  29  GLUCOSE 210*  208* 172*  < > 167*  < > 97 136* 90 116* 96  BUN 47*  48* 89*  < > 63*  < > 97* 110* 117* 44* 58*  CREATININE 9.49*  9.48* 12.39*  < > 8.17*  < > 10.53* 11.40* 12.38* 7.10* 8.35*  CALCIUM 8.1*  8.1* 8.6*  --  8.7*  --  8.3*  --  8.3*  --  8.0*  MG 2.1  --   --   --   --   --   --   --   --   --   PHOS 6.5* 9.1*  --  9.6*  --  11.7*  --  13.6*  --   --   < > = values in this interval not displayed. Liver Function Tests:  Recent Labs Lab 02/02/15 0454 02/03/15 0550 02/04/15 0455 02/05/15 0602 02/06/15 0810  AST 29  --   --   --   --   ALT 19  --   --   --   --   ALKPHOS 71  --   --   --   --   BILITOT 0.9  --   --   --   --   PROT 5.9*  --   --   --   --   ALBUMIN 3.0*  3.0* 2.9* 2.9* 2.7* 2.7*   No  results for input(s): LIPASE, AMYLASE in the last 168 hours. No results for input(s): AMMONIA in the last 168 hours. CBC:  Recent Labs Lab 02/03/15 0550  02/04/15 0455  02/05/15 0557 02/05/15 1425 02/06/15 0811 02/06/15 1432 02/07/15 0307  WBC 14.7*  --  12.2*  --  11.1*  --  8.4  --  6.5  NEUTROABS  --   --   --   --   --   --  4.9  --   --   HGB 10.1*  < > 10.3*  < > 9.8* 9.5* 9.6* 9.5* 9.1*  HCT 28.4*  < > 28.9*  < > 27.7* 28.0* 27.1* 28.0* 25.6*  MCV 92.5  --  91.7  --  92.0  --  93.4  --  93.8  PLT 128*  --  99*  --  96*  --  93*  --  79*  < > = values in this interval not displayed. Cardiac Enzymes: No results for input(s): CKTOTAL, CKMB, CKMBINDEX, TROPONINI in the last 168 hours. BNP (last 3 results) No results for input(s): BNP in the last 8760 hours.  ProBNP (last 3 results) No results for input(s): PROBNP in the last 8760 hours.  CBG: No results for input(s): GLUCAP in the last 168 hours.  Recent Results (from the past 240 hour(s))  MRSA PCR Screening     Status: None   Collection Time: 01/30/15  7:01 PM  Result Value Ref Range Status   MRSA by PCR NEGATIVE NEGATIVE Final    Comment:        The GeneXpert MRSA Assay (FDA approved for NASAL specimens only), is one component of a comprehensive MRSA colonization surveillance program. It is not intended to diagnose MRSA infection nor to guide or monitor treatment for MRSA infections.      Studies:  Recent x-ray studies have been reviewed in detail by the Attending Physician  Scheduled Meds:  Scheduled Meds: . antiseptic oral rinse  7 mL Mouth Rinse BID  . atenolol  25 mg Oral QHS  . budesonide-formoterol  2 puff Inhalation BID  . fluticasone  1 spray Each Nare Daily  . sodium chloride  3 mL Intravenous Q12H    Time spent on care of this patient: 40 mins   WOODS, Geraldo Docker , MD  [facility] Office  [phone] Pager [phone]  On-Call/Text Page:      [email or web address]      password  [email or web address]  If 7PM-7AM, please contact night-coverage [email or web address] Password [email or web address] 02/08/2015, 8:05 AM   LOS: 9 days   Care during the described time interval was provided by me .  I have reviewed this patient's available data, including medical history, events of note, physical examination, and all test results as part of my evaluation. I have personally reviewed and interpreted all radiology studies.   Dia Crawford, MD [phone] Pager

## 2015-02-08 NOTE — Telephone Encounter (Signed)
Lm for pt, dpm

## 2015-02-08 NOTE — Progress Notes (Signed)
1. AKI - Oliguric AKI . Renal biopsy with ATN and myeloma cast Nephropathy. HD & TPE Today and last Fri.   I have spoke to Dr. Julien Nordmann who will initiate chem in hospital ans last TPE will be Friday. I also anticipate pt will need to sat in house to declare status of renal failure(AKI w/ recovery v ESRD) 2. Multiple myeloma 3. S/P L Cimino 4 R IJ PC  Subjective: Interval History: Some UOP!  Objective: Vital signs in last 24 hours: Temp:  [97.7 F (36.5 C)-98.6 F (37 C)] 98.3 F (36.8 C) (06/30 0941) Pulse Rate:  [60-84] 73 (06/30 0941) Resp:  [14-22] 22 (06/30 0941) BP: (104-162)/(41-86) 116/41 mmHg (06/30 0941) SpO2:  [89 %-100 %] 94 % (06/30 0941) Weight:  [81.7 kg (180 lb 1.9 oz)] 81.7 kg (180 lb 1.9 oz) (06/30 0500) Weight change: -11.3 kg (-24 lb 14.6 oz)  Intake/Output from previous day: 06/29 0701 - 06/30 0700 In: 500 [I.V.:500] Out: 10 [Emesis/NG output:10] Intake/Output this shift: Total I/O In: 240 [P.O.:240] Out: 125 [Urine:125]  General appearance: alert and cooperative Resp: clear to auscultation bilaterally Chest wall: no tenderness Lung clear Cor RRR Extremities: extremities normal, atraumatic, no cyanosis or edema  Lab Results:  Recent Labs  02/06/15 0811 02/06/15 1432 02/07/15 0307  WBC 8.4  --  6.5  HGB 9.6* 9.5* 9.1*  HCT 27.1* 28.0* 25.6*  PLT 93*  --  79*   BMET:  Recent Labs  02/06/15 0810 02/06/15 1432 02/07/15 0307  NA 140 139 142  K 4.2 3.8 4.0  CL 95* 98* 99*  CO2 29  --  29  GLUCOSE 90 116* 96  BUN 117* 44* 58*  CREATININE 12.38* 7.10* 8.35*  CALCIUM 8.3*  --  8.0*   No results for input(s): PTH in the last 72 hours. Iron Studies: No results for input(s): IRON, TIBC, TRANSFERRIN, FERRITIN in the last 72 hours. Studies/Results: Dg Chest Port 1 View  02/07/2015   CLINICAL DATA:  Central line placement portable exam 1215 hours compared to 01/31/2015  EXAM: PORTABLE CHEST - 1 VIEW  COMPARISON:  None.  FINDINGS: RIGHT  jugular central venous catheter with tips projecting over RIGHT atrium.  Enlargement of cardiac silhouette.  Mediastinal contours and pulmonary vascularity normal.  Atherosclerotic calcification aorta.  Bibasilar atelectasis.  No gross infiltrate, pleural effusion or pneumothorax.  No acute osseous findings.  IMPRESSION: No pneumothorax following central line placement.  Bibasilar atelectasis.   Electronically Signed   By: Lavonia Dana M.D.   On: 02/07/2015 12:22   Scheduled: . antiseptic oral rinse  7 mL Mouth Rinse BID  . atenolol  25 mg Oral QHS  . budesonide-formoterol  2 puff Inhalation BID  . dexamethasone  40 mg Oral Once  . fluticasone  1 spray Each Nare Daily  . sodium chloride  3 mL Intravenous Q12H     LOS: 9 days   Mariell Nester C 02/08/2015,10:58 AM

## 2015-02-08 NOTE — Progress Notes (Signed)
[facility]  Subjective  - Walking in the halls no complaints of steal symptoms in the left UE.   Objective 110/46 63 98.3 F (36.8 C) (Oral) 17 100%  Intake/Output Summary (Last 24 hours) at 02/08/15 0807 Last data filed at 02/07/15 1200  Gross per 24 hour  Intake    500 ml  Output     10 ml  Net    490 ml    Palpable thrill left AV fistula Left hand N/V/M intact Incisional bleeding, non pulsatile, clean dry dressing applied   Assessment/Planning: POD # 1 Left forearm AV fistula  Will re-evaluate the incision tomorrow  Laurence [facility] 02/08/2015 8:07 AM --  Laboratory Lab Results:  Recent Labs  02/06/15 0811 02/06/15 1432 02/07/15 0307  WBC 8.4  --  6.5  HGB 9.6* 9.5* 9.1*  HCT 27.1* 28.0* 25.6*  PLT 93*  --  79*   BMET  Recent Labs  02/06/15 0810 02/06/15 1432 02/07/15 0307  NA 140 139 142  K 4.2 3.8 4.0  CL 95* 98* 99*  CO2 29  --  29  GLUCOSE 90 116* 96  BUN 117* 44* 58*  CREATININE 12.38* 7.10* 8.35*  CALCIUM 8.3*  --  8.0*    COAG Lab Results  Component Value Date   INR 1.18 02/07/2015   INR 1.11 02/02/2015   No results found for: PTT

## 2015-02-08 NOTE — Progress Notes (Signed)
Physical Therapy Treatment Patient Details Name: Justin Woods MRN: [id number] DOB: 06-02-1931 Today's Date: 2015/02/22    History of Present Illness Justin Woods is a 81 y.o. male who presented to his PCP with c/o fatigue, LE swelling, SOB, and with acute kidney failure, potential myeloma with plasmpharesis and HD initiated acutely, Hx of HTN    PT Comments    [name] continues to maximize function and gait with fatigue end of session but eager to progress and return home. Pt states he was out of room for procedures all day yesterday and unable to mobilize. Encouraged mobility today with nursing and continued HEP. Sats remained 90-95% on RA throughout activity today with HR 65-73  Follow Up Recommendations  Home health PT;Supervision for mobility/OOB     Equipment Recommendations  None recommended by PT (pt states he can get RW from church)    Recommendations for Other Services       Precautions / Restrictions Precautions Precautions: Fall    Mobility  Bed Mobility Overal bed mobility: Modified Independent                Transfers Overall transfer level: Modified independent                  Ambulation/Gait Ambulation/Gait assistance: Supervision Ambulation Distance (Feet): 450 Feet Assistive device: Rolling walker (2 wheeled) Gait Pattern/deviations: Step-through pattern;Decreased stride length   Gait velocity interpretation: Below normal speed for age/gender General Gait Details: cues for posture and directional cues    Stairs            Wheelchair Mobility    Modified Rankin (Stroke Patients Only)       Balance Overall balance assessment: Needs assistance   Sitting balance-Leahy Scale: Good       Standing balance-Leahy Scale: Fair                      Cognition Arousal/Alertness: Awake/alert Behavior During Therapy: WFL for tasks assessed/performed Overall Cognitive Status: Within Functional Limits for tasks  assessed                      Exercises General Exercises - Lower Extremity Ankle Circles/Pumps: AROM;Both;Seated;20 reps Long Arc Quad: AROM;Both;Seated;20 reps Hip ABduction/ADduction: AROM;Seated;Both;20 reps Hip Flexion/Marching: AROM;Both;Seated;20 reps    General Comments        Pertinent Vitals/Pain Pain Assessment: No/denies pain    Home Living                      Prior Function            PT Goals (current goals can now be found in the care plan section) Progress towards PT goals: Progressing toward goals    Frequency       PT Plan Current plan remains appropriate    Co-evaluation             End of Session   Activity Tolerance: Patient tolerated treatment well Patient left: in chair;with call bell/phone within reach;with family/visitor present     Time: 0998-3382 PT Time Calculation (min) (ACUTE ONLY): 23 min  Charges:  $Gait Training: 8-22 mins $Therapeutic Exercise: 8-22 mins                    G Codes:      Melford Aase 02-22-2015, 8:33 AM Elwyn Reach, [place]

## 2015-02-08 NOTE — Procedures (Signed)
Patient seen on Hemodialysis. QB 400, UF goal 1L Treatment adjusted as needed.  Elmarie Shiley MD [facility]. Office # [phone] Pager # [phone] 4:59 PM

## 2015-02-08 NOTE — Telephone Encounter (Signed)
-----   Message from Mena Goes, RN sent at 02/07/2015 11:30 AM EDT ----- Regarding: Schedule   ----- Message -----    From: Ulyses Amor, PA-C    Sent: 02/07/2015  11:17 AM      To: Vvs Charge Pool  Left forearm av fistula creation Dr. Donnetta Hutching f/u in 4-6 weeks with fistula duplex

## 2015-02-08 NOTE — Care Management (Signed)
Important Message  Patient Details  Name: Justin Woods MRN: [id number] Date of Birth: 1931/03/04   Medicare Important Message Given:  Yes-third notification given    Pricilla Handler 02/08/2015, 2:14 PM

## 2015-02-08 NOTE — Progress Notes (Addendum)
CHART NOTE  I received a call from Dr. Florene Glen earlier today indicating that the patient may not use the hospital for another 2 weeks because of need for dialysis for his acute lung injury. My plan was to start the patient on systemic therapy with weekly subcutaneous Velcade as well as oral Decadron on an outpatient basis next week. Because of this delay in discharging the patient, I would consider starting him on treatment with Velcade 1.3 MG/M2 subcutaneously in addition to Decadron 40 mg by mouth weekly during his hospitalization. This was discussed with the patient and his wife yesterday and they are in agreement with the treatment plan. He is expected to start the first dose of his treatment today. We will start him on prophylactic acyclovir during his treatment with Velcade. The recommendation was also discussed with the pharmacist, Coleen at [facility].

## 2015-02-09 ENCOUNTER — Encounter (HOSPITAL_COMMUNITY): Payer: Self-pay

## 2015-02-09 ENCOUNTER — Encounter: Payer: Self-pay | Admitting: *Deleted

## 2015-02-09 LAB — THERAPEUTIC PLASMA EXCHANGE (BLOOD BANK)
Plasma Exchange: 3250
Plasma volume needed: 3250
UNIT DIVISION: 0
UNIT DIVISION: 0
UNIT DIVISION: 0
UNIT DIVISION: 0
UNIT DIVISION: 0
UNIT DIVISION: 0
UNIT DIVISION: 0
UNIT DIVISION: 0
UNIT DIVISION: 0
Unit division: 0
Unit division: 0
Unit division: 0

## 2015-02-09 LAB — POCT I-STAT, CHEM 8
BUN: 27 mg/dL — ABNORMAL HIGH (ref 6–20)
Calcium, Ion: 0.98 mmol/L — ABNORMAL LOW (ref 1.13–1.30)
Chloride: 90 mmol/L — ABNORMAL LOW (ref 101–111)
Creatinine, Ser: 6.5 mg/dL — ABNORMAL HIGH (ref 0.61–1.24)
Glucose, Bld: 113 mg/dL — ABNORMAL HIGH (ref 65–99)
HCT: 22 % — ABNORMAL LOW (ref 39.0–52.0)
Hemoglobin: 7.5 g/dL — ABNORMAL LOW (ref 13.0–17.0)
Potassium: 3.8 mmol/L (ref 3.5–5.1)
SODIUM: 139 mmol/L (ref 135–145)
TCO2: 33 mmol/L (ref 0–100)

## 2015-02-09 LAB — RENAL FUNCTION PANEL
ALBUMIN: 2.7 g/dL — AB (ref 3.5–5.0)
Anion gap: 13 (ref 5–15)
BUN: 26 mg/dL — AB (ref 6–20)
CHLORIDE: 94 mmol/L — AB (ref 101–111)
CO2: 35 mmol/L — AB (ref 22–32)
Calcium: 7.7 mg/dL — ABNORMAL LOW (ref 8.9–10.3)
Creatinine, Ser: 6.55 mg/dL — ABNORMAL HIGH (ref 0.61–1.24)
GFR calc non Af Amer: 7 mL/min — ABNORMAL LOW (ref >60)
GFR, EST AFRICAN AMERICAN: 8 mL/min — AB (ref >60)
Glucose, Bld: 116 mg/dL — ABNORMAL HIGH (ref 65–99)
Phosphorus: 7.4 mg/dL — ABNORMAL HIGH (ref 2.5–4.6)
Potassium: 3.8 mmol/L (ref 3.5–5.1)
SODIUM: 142 mmol/L (ref 135–145)

## 2015-02-09 LAB — CBC
HEMATOCRIT: 22.9 % — AB (ref 39.0–52.0)
HEMOGLOBIN: 7.9 g/dL — AB (ref 13.0–17.0)
MCH: 33.2 pg (ref 26.0–34.0)
MCHC: 34.5 g/dL (ref 30.0–36.0)
MCV: 96.2 fL (ref 78.0–100.0)
PLATELETS: 87 10*3/uL — AB (ref 150–400)
RBC: 2.38 MIL/uL — ABNORMAL LOW (ref 4.22–5.81)
RDW: 13.7 % (ref 11.5–15.5)
WBC: 6.2 10*3/uL (ref 4.0–10.5)

## 2015-02-09 MED ORDER — DIPHENHYDRAMINE HCL 25 MG PO CAPS: 25.0000 mg | ORAL_CAPSULE | Freq: Four times a day (QID) | ORAL | Status: DC | PRN

## 2015-02-09 MED ORDER — CALCIUM CARBONATE ANTACID 500 MG PO CHEW
2.0000 | CHEWABLE_TABLET | ORAL | Status: DC
  Administered 2015-02-09: 400 mg via ORAL
  Filled 2015-02-09 (×2): qty 2

## 2015-02-09 MED ORDER — ACD FORMULA A 0.73-2.45-2.2 GM/100ML VI SOLN
500.0000 mL | Status: DC
  Filled 2015-02-09: qty 500

## 2015-02-09 MED ORDER — ONDANSETRON HCL 8 MG PO TABS
8.0000 mg | ORAL_TABLET | Freq: Once | ORAL | Status: AC
  Administered 2015-02-09: 8 mg via ORAL
  Filled 2015-02-09: qty 1
  Filled 2015-02-09: qty 2

## 2015-02-09 MED ORDER — ACETAMINOPHEN 325 MG PO TABS: 650.0000 mg | ORAL_TABLET | ORAL | Status: DC | PRN

## 2015-02-09 MED ORDER — CALCIUM GLUCONATE 10 % IV SOLN
4.0000 g | Freq: Once | INTRAVENOUS | Status: AC
  Administered 2015-02-09: 4 g via INTRAVENOUS
  Filled 2015-02-09: qty 40

## 2015-02-09 MED ORDER — ACD FORMULA A 0.73-2.45-2.2 GM/100ML VI SOLN
Status: AC
  Administered 2015-02-09: 500 mL via INTRAVENOUS
  Filled 2015-02-09: qty 1000

## 2015-02-09 MED ORDER — ANTICOAGULANT SODIUM CITRATE 4% (200MG/5ML) IV SOLN
5.0000 mL | Freq: Once | Status: AC
  Administered 2015-02-09: 5 mL
  Filled 2015-02-09: qty 250

## 2015-02-09 MED ORDER — CALCIUM CARBONATE ANTACID 500 MG PO CHEW
CHEWABLE_TABLET | ORAL | Status: AC
  Administered 2015-02-09: 200 mg via ORAL
  Filled 2015-02-09: qty 4

## 2015-02-09 MED ORDER — BORTEZOMIB CHEMO SQ INJECTION 3.5 MG (2.5MG/ML)
1.3000 mg/m2 | Freq: Once | INTRAMUSCULAR | Status: AC
  Administered 2015-02-09: 2.5 mg via SUBCUTANEOUS
  Filled 2015-02-09: qty 1

## 2015-02-09 NOTE — Progress Notes (Signed)
Report given to Kennon Rounds, RN prior to Plasmapharesis.

## 2015-02-09 NOTE — Progress Notes (Signed)
[name] TEAM 1 - Stepdown/ICU TEAM Progress Note  Justin Woods RXV:[id number] DOB: 08-28-1930 DOA: 01/30/2015 PCP: Wenda Low, MD  Admit HPI / Brief Narrative: 81yo male with history of HTN and renal stones who went to see his PCP c/o fatigue with decreased urine output.  Labs noted creatinine of 12.6, w/ baseline at PCP office in October 2015 of 1.17.  CT abdomen and pelvis did not show evidence of renal stones or hydronephrosis, only mild pre-ureteral stranding.  Patient was seen by Urology Dr.Herrick and had Foley catheter inserted with minimal urine output (total 150 mL).  HPI/Subjective: The patient is sedate after returning from the OR for his vascular surgery.  His respirations are calm and he appears comfortable.  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 - Oncology has initiated Velcade and Decadron  Hyperkalemia - resolved Due to acute renal failure - corrected with hemodialysis  Acute hypoxic respiratory failure - pulmonary edema - resolved pulmonary edema related to acute renal failure - resolved with ongoing dialysis - wean to room air   Anemia Due to kidney failure + MM - no indication for epo or iron at this time - follow hemoglobin trend as patient approaching transfusion threshold of 7.0  Hypertension Blood pressure well controlled today  Prior history of DVT during hospitalization Unable to use anticoagulation of any type for 2 weeks post renal biopsy  COPD No evidence of acute exacerbation at present time  Chronic arthritis Denies focal joint pain at this time  Code Status: FULL Family Communication: Spoke with wife at length at  bedside Disposition Plan: SDU - possible transfer to renal floor in AM  Consultants: Nephrology  Hematology/Oncology Vasc Surgery   Procedures: 6/22 insertion L IJ temporary hemodialysis catheter 6/22 HD initiated  6/24 kidney bx 6/25 plasmapheresis initiated 6/27 BMbx 6/29 R IJ tunneled HD cath + L radiocephalic AV fistula   Antibiotics: None   DVT prophylaxis: SCDs (post renal bx)  Objective: Blood pressure 119/53, pulse 73, temperature 97.8 F (36.6 C), temperature source Oral, resp. rate 20, height 5\' 6"  (1.676 m), weight 78 kg (171 lb 15.3 oz), SpO2 99 %.  Intake/Output Summary (Last 24 hours) at 02/09/15 1954 Last data filed at 02/09/15 1700  Gross per 24 hour  Intake      0 ml  Output    150 ml  Net   -150 ml    Exam: General: No acute respiratory distress-alert and conversant Lungs: No appreciable crackles or wheezes Cardiovascular: Regular rate & rhythm without gallop or rub or M Abdomen: Nontender, mildly protuberant, soft, bowel sounds positive, no rebound, no ascites, no mass Extremities: No significant cyanosis, clubbing, or edema bilateral lower extremities  Data Reviewed: Basic Metabolic Panel:  Recent Labs Lab 02/03/15 0550  02/04/15 0455  02/05/15 0602  02/06/15 0810 02/06/15 1432 02/07/15 0307 02/08/15 2011 02/09/15 0956 02/09/15 1015  NA 138  < > 138  < > 140  < > 140 139 142 137 139 142  K 4.5  < > 4.4  < > 4.0  < > 4.2 3.8 4.0 3.1* 3.8 3.8  CL 97*  < > 97*  < > 97*  < > 95* 98* 99* 96* 90* 94*  CO2 22  --  27  --  29  --  29  --  29  --   --  35*  GLUCOSE 172*  < > 167*  < > 97  < > 90 116* 96 82 113* 116*  BUN 89*  < > 63*  < > 97*  < > 117* 44* 58* 18 27* 26*  CREATININE 12.39*  < > 8.17*  < > 10.53*  < > 12.38* 7.10* 8.35* 3.70* 6.50* 6.55*  CALCIUM 8.6*  --  8.7*  --  8.3*  --  8.3*  --  8.0*  --   --  7.7*  PHOS 9.1*  --  9.6*  --  11.7*  --  13.6*  --   --   --   --  7.4*  < > = values in this interval not  displayed.  CBC:  Recent Labs Lab 02/04/15 0455  02/05/15 0557  02/06/15 0811 02/06/15 1432 02/07/15 0307 02/08/15 2011 02/09/15 0956 02/09/15 1015  WBC 12.2*  --  11.1*  --  8.4  --  6.5  --   --  6.2  NEUTROABS  --   --   --   --  4.9  --   --   --   --   --   HGB 10.3*  < > 9.8*  < > 9.6* 9.5* 9.1* 7.5* 7.5* 7.9*  HCT 28.9*  < > 27.7*  < > 27.1* 28.0* 25.6* 22.0* 22.0* 22.9*  MCV 91.7  --  92.0  --  93.4  --  93.8  --   --  96.2  PLT 99*  --  96*  --  93*  --  79*  --   --  87*  < > = values in this interval not displayed.  Liver Function Tests:  Recent Labs Lab 02/03/15 0550 02/04/15 0455 02/05/15 0602 02/06/15 0810 02/09/15 1015  ALBUMIN 2.9* 2.9* 2.7* 2.7* 2.7*    No results found for this or any previous visit (from the past 240 hour(s)).   Studies:   Recent x-ray studies have been reviewed in detail by the Attending Physician  Scheduled Meds:  Scheduled Meds: . acyclovir  200 mg Oral BID  . antiseptic oral rinse  7 mL Mouth Rinse BID  . atenolol  25 mg Oral QHS  . budesonide-formoterol  2 puff Inhalation BID  . dexamethasone  40 mg Oral Weekly  . fluticasone  1 spray Each Nare Daily  . sodium chloride  3 mL Intravenous Q12H    Time spent on care of this patient: 25 mins   [facility] , MD   [facility] Office  [phone] Pager - Text Page per Shea Evans as per below:  On-Call/Text Page:      [email or web address]      password [email or web address]  If 7PM-7AM, please contact night-coverage [email or web address] Password [email or web address] 02/09/2015, 7:54 PM   LOS: 10 days

## 2015-02-09 NOTE — Progress Notes (Signed)
Expand All Collapse All   1. AKI - Oliguric AKI . Renal biopsy with ATN and myeloma cast Nephropathy. Last TPE today.  Now on TTS shcedule for IHD.  Will check free light chains in AM for baseline post TPE. Velcade and Decadron started yesterday. I also anticipate pt will need to stay in house to declare status of renal failure(AKI w/ recovery v ESRD) for more days 2. Multiple myeloma 3. S/P L Cimino 4 R IJ PC     Subjective: Interval History: reports UOP increased but I/o show only 125cc.  Will place condom cath  Objective: Vital signs in last 24 hours: Temp:  [97.4 F (36.3 C)-98.6 F (37 C)] 98.6 F (37 C) (07/01 0736) Pulse Rate:  [59-81] 72 (07/01 0906) Resp:  [15-22] 19 (07/01 0906) BP: (94-139)/(41-64) 112/52 mmHg (07/01 0736) SpO2:  [89 %-100 %] 89 % (07/01 0906) FiO2 (%):  [21 %] 21 % (07/01 0806) Weight:  [90.5 kg (199 lb 8.3 oz)-93.8 kg (206 lb 12.7 oz)] 90.5 kg (199 lb 8.3 oz) (07/01 0335) Weight change: 12.1 kg (26 lb 10.8 oz)  Intake/Output from previous day: 06/30 0701 - 07/01 0700 In: 240 [P.O.:240] Out: 1125 [Urine:125] Intake/Output this shift:    General appearance: alert and cooperative Chest wall: no tenderness Cardio: regular rate and rhythm, S1, S2 normal, no murmur, click, rub or gallop GI: soft, non-tender; bowel sounds normal; no masses,  no organomegaly Extremities: extremities normal, atraumatic, no cyanosis , 1 +edema  Lab Results:  Recent Labs  02/07/15 0307 02/08/15 2011  WBC 6.5  --   HGB 9.1* 7.5*  HCT 25.6* 22.0*  PLT 79*  --    BMET:  Recent Labs  02/07/15 0307 02/08/15 2011  NA 142 137  K 4.0 3.1*  CL 99* 96*  CO2 29  --   GLUCOSE 96 82  BUN 58* 18  CREATININE 8.35* 3.70*  CALCIUM 8.0*  --    No results for input(s): PTH in the last 72 hours. Iron Studies: No results for input(s): IRON, TIBC, TRANSFERRIN, FERRITIN in the last 72 hours. Studies/Results: Dg Chest Port 1 View  02/07/2015   CLINICAL DATA:   Central line placement portable exam 1215 hours compared to 01/31/2015  EXAM: PORTABLE CHEST - 1 VIEW  COMPARISON:  None.  FINDINGS: RIGHT jugular central venous catheter with tips projecting over RIGHT atrium.  Enlargement of cardiac silhouette.  Mediastinal contours and pulmonary vascularity normal.  Atherosclerotic calcification aorta.  Bibasilar atelectasis.  No gross infiltrate, pleural effusion or pneumothorax.  No acute osseous findings.  IMPRESSION: No pneumothorax following central line placement.  Bibasilar atelectasis.   Electronically Signed   By: Lavonia Dana M.D.   On: 02/07/2015 12:22   Scheduled: . acyclovir  200 mg Oral BID  . anticoagulant sodium citrate  5 mL Intracatheter Once  . antiseptic oral rinse  7 mL Mouth Rinse BID  . atenolol  25 mg Oral QHS  . bortezomib SQ  1.3 mg/m2 (Treatment Plan Actual) Subcutaneous Once  . budesonide-formoterol  2 puff Inhalation BID  . calcium carbonate  2 tablet Oral Q3H  . calcium gluconate IVPB  4 g Intravenous Once  . citrate dextrose      . dexamethasone  40 mg Oral Weekly  . fluticasone  1 spray Each Nare Daily  . sodium chloride  3 mL Intravenous Q12H     LOS: 10 days   Zoeie Ritter C 02/09/2015,9:11 AM

## 2015-02-09 NOTE — Consult Note (Addendum)
   [facility] CM Inpatient Consult   02/09/2015  ARN MCOMBER 07-Jun-1931 [id number]   Patient evaluated for [facility] services. Spoke with patient, wife, and grand-daughters at bedside to explain services. Written consent obtained. Explained that [facility] will not replace or interfere with services provided by home health. Also discussed that initial post transition of care calls will be provided [facility] will follow up for monthly home visits if needed. Patient lives with wife and has very supportive family. [facility] Care Management packet provided along with contact information. Appreciative of visit. Will make inpatient RNCM aware.    Marthenia Rolling, MSN-Ed, RN,BSN [facility] Liaison [phone]

## 2015-02-09 NOTE — Progress Notes (Signed)
Physical Therapy Treatment Patient Details Name: Justin Woods MRN: [id number] DOB: 07-Mar-1931 Today's Date: Feb 17, 2015    History of Present Illness Justin Woods is a 81 y.o. male who presented to his PCP with c/o fatigue, LE swelling, SOB, and with acute kidney failure, potential myeloma with plasmpharesis and HD initiated acutely, Hx of HTN    PT Comments    Pt continues to be very motivated to return home. Moving well and not yet able to ambulate without RW. Educated for HEP and continues to increase gait and HEP. Pt encouraged to ambulate daily with nursing and will continue to follow to increase independence.  Follow Up Recommendations  Home health PT;Supervision for mobility/OOB     Equipment Recommendations       Recommendations for Other Services       Precautions / Restrictions Precautions Precautions: Fall Restrictions Weight Bearing Restrictions: No    Mobility  Bed Mobility               General bed mobility comments: in chair on arrival.  Transfers Overall transfer level: Modified independent                  Ambulation/Gait Ambulation/Gait assistance: Modified independent (Device/Increase time) Ambulation Distance (Feet): 525 Feet Assistive device: Rolling walker (2 wheeled) Gait Pattern/deviations: Step-through pattern;Decreased stride length   Gait velocity interpretation: Below normal speed for age/gender General Gait Details: good use of RW today without need for cues for DME use   Stairs Stairs: Yes Stairs assistance: Modified independent (Device/Increase time) Stair Management: Two rails;Step to pattern;Forwards Number of Stairs: 3    Wheelchair Mobility    Modified Rankin (Stroke Patients Only)       Balance                                    Cognition Arousal/Alertness: Awake/alert Behavior During Therapy: WFL for tasks assessed/performed Overall Cognitive Status: Within Functional Limits for  tasks assessed                      Exercises General Exercises - Lower Extremity Long Arc Quad: AROM;Both;Seated;20 reps Hip ABduction/ADduction: AROM;Seated;Both;20 reps Hip Flexion/Marching: AROM;Both;Seated;20 reps Toe Raises: AROM;Seated;Both;20 reps Heel Raises: AROM;Seated;Both;20 reps    General Comments        Pertinent Vitals/Pain Pain Assessment: No/denies pain  HR 68 sats 92% on RA    Home Living                      Prior Function            PT Goals (current goals can now be found in the care plan section) Progress towards PT goals: Goals met and updated - see care plan    Frequency       PT Plan Current plan remains appropriate    Co-evaluation             End of Session   Activity Tolerance: Patient tolerated treatment well Patient left: in chair;with call bell/phone within reach;with family/visitor present     Time: 0459-9774 PT Time Calculation (min) (ACUTE ONLY): 17 min  Charges:  $Gait Training: 8-22 mins                    G Codes:      Melford Aase 17-Feb-2015, 11:03 AM Elwyn Reach, [place]

## 2015-02-09 NOTE — Progress Notes (Signed)
  [facility] Progress Note  Subjective  - POD #2  No complaints.   Objective Filed Vitals:   02/09/15 0736  BP: 112/52  Pulse: 68  Temp: 98.6 F (37 C)  Resp: 15   Left upper extremity incision with dried blood on steri-strips. No active bleeding. Palpable thrill. Palpable radial pulse.   Assessment/Planning: 81 y.o. male is s/p: left radial-cephalic AV fistula, insertion of right IJ tunneled dialysis catheter 2 Days Post-Op   Fistula patent. Incision intact. Dried blood on steri-strips.  [facility] working without complications.  F/u on 03/20/15 with Dr. Donnetta Hutching. Will sign off.    Alvia Grove 02/09/2015 8:06 AM --

## 2015-02-10 LAB — THERAPEUTIC PLASMA EXCHANGE (BLOOD BANK)
Plasma volume needed: 3250
UNIT DIVISION: 0
UNIT DIVISION: 0
UNIT DIVISION: 0
UNIT DIVISION: 0
UNIT DIVISION: 0
Unit division: 0
Unit division: 0
Unit division: 0
Unit division: 0
Unit division: 0
Unit division: 0
Unit division: 0

## 2015-02-10 LAB — RENAL FUNCTION PANEL
ANION GAP: 13 (ref 5–15)
Albumin: 2.9 g/dL — ABNORMAL LOW (ref 3.5–5.0)
BUN: 38 mg/dL — ABNORMAL HIGH (ref 6–20)
CHLORIDE: 91 mmol/L — AB (ref 101–111)
CO2: 33 mmol/L — AB (ref 22–32)
Calcium: 8.4 mg/dL — ABNORMAL LOW (ref 8.9–10.3)
Creatinine, Ser: 7.8 mg/dL — ABNORMAL HIGH (ref 0.61–1.24)
GFR calc Af Amer: 7 mL/min — ABNORMAL LOW (ref >60)
GFR, EST NON AFRICAN AMERICAN: 6 mL/min — AB (ref >60)
GLUCOSE: 196 mg/dL — AB (ref 65–99)
PHOSPHORUS: 8.7 mg/dL — AB (ref 2.5–4.6)
POTASSIUM: 4.6 mmol/L (ref 3.5–5.1)
Sodium: 137 mmol/L (ref 135–145)

## 2015-02-10 LAB — PREPARE RBC (CROSSMATCH)

## 2015-02-10 LAB — CBC
HCT: 21.8 % — ABNORMAL LOW (ref 39.0–52.0)
Hemoglobin: 7.6 g/dL — ABNORMAL LOW (ref 13.0–17.0)
MCH: 33.2 pg (ref 26.0–34.0)
MCHC: 34.9 g/dL (ref 30.0–36.0)
MCV: 95.2 fL (ref 78.0–100.0)
Platelets: 81 10*3/uL — ABNORMAL LOW (ref 150–400)
RBC: 2.29 MIL/uL — ABNORMAL LOW (ref 4.22–5.81)
RDW: 13.4 % (ref 11.5–15.5)
WBC: 5 10*3/uL (ref 4.0–10.5)

## 2015-02-10 MED ORDER — SODIUM CHLORIDE 0.9 % IV SOLN: Freq: Once | INTRAVENOUS | Status: DC

## 2015-02-10 MED ORDER — AMLODIPINE BESYLATE 5 MG PO TABS
5.0000 mg | ORAL_TABLET | Freq: Every day | ORAL | Status: DC
  Administered 2015-02-10 – 2015-02-15 (×6): 5 mg via ORAL
  Filled 2015-02-10 (×7): qty 1

## 2015-02-10 MED ORDER — LIDOCAINE-PRILOCAINE 2.5-2.5 % EX CREA: 1.0000 "application " | TOPICAL_CREAM | CUTANEOUS | Status: DC | PRN

## 2015-02-10 MED ORDER — NEPRO/CARBSTEADY PO LIQD
237.0000 mL | ORAL | Status: DC | PRN
  Filled 2015-02-10: qty 237

## 2015-02-10 MED ORDER — HEPARIN SODIUM (PORCINE) 1000 UNIT/ML DIALYSIS: 20.0000 [IU]/kg | INTRAMUSCULAR | Status: DC | PRN

## 2015-02-10 MED ORDER — SODIUM CHLORIDE 0.9 % IV SOLN: 100.0000 mL | INTRAVENOUS | Status: DC | PRN

## 2015-02-10 MED ORDER — ALTEPLASE 2 MG IJ SOLR
2.0000 mg | Freq: Once | INTRAMUSCULAR | Status: DC | PRN
  Filled 2015-02-10: qty 2

## 2015-02-10 MED ORDER — PENTAFLUOROPROP-TETRAFLUOROETH EX AERO: 1.0000 "application " | INHALATION_SPRAY | CUTANEOUS | Status: DC | PRN

## 2015-02-10 MED ORDER — LIDOCAINE HCL (PF) 1 % IJ SOLN: 5.0000 mL | INTRAMUSCULAR | Status: DC | PRN

## 2015-02-10 MED ORDER — HEPARIN SODIUM (PORCINE) 1000 UNIT/ML DIALYSIS: 1000.0000 [IU] | INTRAMUSCULAR | Status: DC | PRN

## 2015-02-10 NOTE — Progress Notes (Signed)
Pt c/o BLE +1 dependent edema. BLE tender to touch with LLE worse than RLE. Pt has been sitting up in his chair with legs dangling. Education regarding circulation given to pt, wife and family. Instruction to elevate BLE was reinforced. All partied verbalized understanding. Pt remains sitting in chair eating dinner. Wheels are locked. Call bell and phone within reach. Will continue to monitor.

## 2015-02-10 NOTE — Progress Notes (Signed)
Patient is resting comfortably on room air at this time. BIPAP is not needed at this time. Will continue to monitor.

## 2015-02-10 NOTE — Progress Notes (Signed)
[facility] TEAM 1 - Stepdown/ICU TEAM Progress Note  Justin Woods VZC:[id number] DOB: 01-Sep-1930 DOA: 01/30/2015 PCP: Wenda Low, MD  Admit HPI / Brief Narrative: 81yo male with history of HTN and renal stones who went to see his PCP c/o fatigue with decreased urine output.  Labs noted creatinine of 12.6 w/ baseline at PCP office in October 2015 of 1.17.  CT abdomen and pelvis did not show evidence of renal stones or hydronephrosis, only mild pre-ureteral stranding.  Patient was seen by Urology Dr.Herrick and had Foley catheter inserted with minimal urine output (total 150 mL).  HPI/Subjective: Patient is seen in his room.  He states he is beginning to feel somewhat better overall.  He denies shortness of breath nausea vomiting or abdominal pain.  He states that he has somewhat of an appetite today.  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 - Oncology has initiated Velcade and Decadron  Hyperkalemia - resolved Due to acute renal failure - corrected with hemodialysis  Acute hypoxic respiratory failure - pulmonary edema - resolved pulmonary edema related to acute renal failure - resolved with ongoing dialysis  Anemia Due to kidney failure + MM - s/p transfusion in HD today - follow Hgb trend   Hypertension Blood pressure elevated today - resume norvasc   Prior history of DVT during hospitalization Unable to use anticoagulation of any type for 2 weeks post renal biopsy  COPD No evidence of acute exacerbation at present time  Chronic  arthritis Denies focal joint pain at this time  Code Status: FULL Family Communication: No family present at time of exam today Disposition Plan: SDU - probable transfer to renal floor in AM  Consultants: Nephrology  Hematology/Oncology Vasc Surgery   Procedures: 6/22 insertion L IJ temporary hemodialysis catheter 6/22 HD initiated  6/24 kidney bx 6/25 plasmapheresis initiated 6/27 BMbx 6/29 R IJ tunneled HD cath + L radiocephalic AV fistula   Antibiotics: None   DVT prophylaxis: SCDs (post renal bx)  Objective: Blood pressure 145/79, pulse 60, temperature 97.6 F (36.4 C), temperature source Oral, resp. rate 18, height 5\' 6"  (1.676 m), weight 93.7 kg (206 lb 9.1 oz), SpO2 100 %.  Intake/Output Summary (Last 24 hours) at 02/10/15 1137 Last data filed at 02/10/15 1100  Gross per 24 hour  Intake    886 ml  Output    390 ml  Net    496 ml    Exam: General: No acute respiratory distress - alert and pleasant  Lungs: CTA th/o - no wheezing  Cardiovascular: Regular rate & rhythm without M/G/R Abdomen: Nontender, mildly protuberant, soft, bowel sounds positive, no rebound, no ascites, no mass Extremities: No significant cyanosis, clubbing, or edema bilateral lower extremities  Data Reviewed: Basic Metabolic Panel:  Recent Labs Lab 02/04/15 0455  02/05/15 0602  02/06/15 0810  02/07/15 0307 02/08/15 2011 02/09/15 0956 02/09/15 1015 02/10/15 0315  NA 138  < > 140  < > 140  < > 142 137 139 142 137  K 4.4  < > 4.0  < > 4.2  < > 4.0 3.1* 3.8 3.8  4.6  CL 97*  < > 97*  < > 95*  < > 99* 96* 90* 94* 91*  CO2 27  --  29  --  29  --  29  --   --  35* 33*  GLUCOSE 167*  < > 97  < > 90  < > 96 82 113* 116* 196*  BUN 63*  < > 97*  < > 117*  < > 58* 18 27* 26* 38*  CREATININE 8.17*  < > 10.53*  < > 12.38*  < > 8.35* 3.70* 6.50* 6.55* 7.80*  CALCIUM 8.7*  --  8.3*  --  8.3*  --  8.0*  --   --  7.7* 8.4*  PHOS 9.6*  --  11.7*  --  13.6*  --   --   --   --  7.4* 8.7*  < > =  values in this interval not displayed.  CBC:  Recent Labs Lab 02/05/15 0557  02/06/15 0811  02/07/15 0307 02/08/15 2011 02/09/15 0956 02/09/15 1015 02/10/15 0315  WBC 11.1*  --  8.4  --  6.5  --   --  6.2 5.0  NEUTROABS  --   --  4.9  --   --   --   --   --   --   HGB 9.8*  < > 9.6*  < > 9.1* 7.5* 7.5* 7.9* 7.6*  HCT 27.7*  < > 27.1*  < > 25.6* 22.0* 22.0* 22.9* 21.8*  MCV 92.0  --  93.4  --  93.8  --   --  96.2 95.2  PLT 96*  --  93*  --  79*  --   --  87* 81*  < > = values in this interval not displayed.  Liver Function Tests:  Recent Labs Lab 02/04/15 0455 02/05/15 0602 02/06/15 0810 02/09/15 1015 02/10/15 0315  ALBUMIN 2.9* 2.7* 2.7* 2.7* 2.9*    No results found for this or any previous visit (from the past 240 hour(s)).   Studies:   Recent x-ray studies have been reviewed in detail by the Attending Physician  Scheduled Meds:  Scheduled Meds: . sodium chloride   Intravenous Once  . sodium chloride   Intravenous Once  . acyclovir  200 mg Oral BID  . antiseptic oral rinse  7 mL Mouth Rinse BID  . atenolol  25 mg Oral QHS  . budesonide-formoterol  2 puff Inhalation BID  . dexamethasone  40 mg Oral Weekly  . fluticasone  1 spray Each Nare Daily  . sodium chloride  3 mL Intravenous Q12H    Time spent on care of this patient: 25 mins   [facility] T , MD   [facility] Office  [phone] Pager - Text Page per Shea Evans as per below:  On-Call/Text Page:      [email or web address]      password [email or web address]  If 7PM-7AM, please contact night-coverage [email or web address] Password [email or web address] 02/10/2015, 11:37 AM   LOS: 11 days

## 2015-02-10 NOTE — Procedures (Signed)
1. AKI - Oliguric AKI . Renal biopsy with ATN and myeloma cast Nephropathy. S/P TPE x 7. Now on TTS shcedule for IHD. Will check kappa free light chains today for baseline post TPE. Velcade and Decadron started per Oncology. I also anticipate pt will need to stay in house to declare status of renal failure(AKI w/ recovery v ESRD) for more days 2. Multiple myeloma 3.  S/P L Cimino 4  R IJ PC  Subjective: Interval History: No urine recently  Objective: Vital signs in last 24 hours: Temp:  [97.4 F (36.3 C)-98.7 F (37.1 C)] 97.4 F (36.3 C) (07/02 0945) Pulse Rate:  [53-84] 62 (07/02 0945) Resp:  [13-21] 18 (07/02 0727) BP: (102-147)/(44-67) 118/60 mmHg (07/02 0945) SpO2:  [90 %-100 %] 96 % (07/02 0930) Weight:  [93.895 kg (207 lb)-94.3 kg (207 lb 14.3 oz)] 94.3 kg (207 lb 14.3 oz) (07/02 0727) Weight change: 0.095 kg (3.3 oz)  Intake/Output from previous day: 07/01 0701 - 07/02 0700 In: 3 [I.V.:3] Out: 150 [Urine:150] Intake/Output this shift: Total I/O In: 555 [P.O.:120; I.V.:100; Blood:335] Out: -   Lab Results:  Recent Labs  02/09/15 1015 02/10/15 0315  WBC 6.2 5.0  HGB 7.9* 7.6*  HCT 22.9* 21.8*  PLT 87* 81*   BMET:  Recent Labs  02/09/15 1015 02/10/15 0315  NA 142 137  K 3.8 4.6  CL 94* 91*  CO2 35* 33*  GLUCOSE 116* 196*  BUN 26* 38*  CREATININE 6.55* 7.80*  CALCIUM 7.7* 8.4*   No results for input(s): PTH in the last 72 hours. Iron Studies: No results for input(s): IRON, TIBC, TRANSFERRIN, FERRITIN in the last 72 hours. Studies/Results: No results found.  Scheduled: . sodium chloride   Intravenous Once  . sodium chloride   Intravenous Once  . acyclovir  200 mg Oral BID  . antiseptic oral rinse  7 mL Mouth Rinse BID  . atenolol  25 mg Oral QHS  . budesonide-formoterol  2 puff Inhalation BID  . dexamethasone  40 mg Oral Weekly  . fluticasone  1 spray Each Nare Daily  . sodium chloride  3 mL Intravenous Q12H     LOS: 11 days    Letina Luckett C 02/10/2015,9:59 AM

## 2015-02-10 NOTE — Progress Notes (Signed)
Pt back from HD treatment. Report received from [place], [place]. No complaints/ concerns expressed. Tele in place. VSS. Respiratory therapist at bedside. Will continue to monitor.

## 2015-02-11 LAB — TYPE AND SCREEN
ABO/RH(D): O POS
ANTIBODY SCREEN: NEGATIVE
UNIT DIVISION: 0
Unit division: 0

## 2015-02-11 LAB — RENAL FUNCTION PANEL
ANION GAP: 11 (ref 5–15)
ANION GAP: 13 (ref 5–15)
Albumin: 2.8 g/dL — ABNORMAL LOW (ref 3.5–5.0)
Albumin: 2.9 g/dL — ABNORMAL LOW (ref 3.5–5.0)
BUN: 29 mg/dL — ABNORMAL HIGH (ref 6–20)
BUN: 36 mg/dL — ABNORMAL HIGH (ref 6–20)
CALCIUM: 8 mg/dL — AB (ref 8.9–10.3)
CALCIUM: 8.1 mg/dL — AB (ref 8.9–10.3)
CHLORIDE: 98 mmol/L — AB (ref 101–111)
CO2: 30 mmol/L (ref 22–32)
CO2: 28 mmol/L (ref 22–32)
CREATININE: 6.08 mg/dL — AB (ref 0.61–1.24)
Chloride: 97 mmol/L — ABNORMAL LOW (ref 101–111)
Creatinine, Ser: 6.73 mg/dL — ABNORMAL HIGH (ref 0.61–1.24)
GFR calc Af Amer: 8 mL/min — ABNORMAL LOW (ref >60)
GFR, EST AFRICAN AMERICAN: 9 mL/min — AB (ref >60)
GFR, EST NON AFRICAN AMERICAN: 8 mL/min — AB (ref >60)
GFR, EST NON AFRICAN AMERICAN: 7 mL/min — AB (ref >60)
GLUCOSE: 123 mg/dL — AB (ref 65–99)
GLUCOSE: 105 mg/dL — AB (ref 65–99)
Phosphorus: 5.9 mg/dL — ABNORMAL HIGH (ref 2.5–4.6)
Phosphorus: 6.4 mg/dL — ABNORMAL HIGH (ref 2.5–4.6)
Potassium: 3.9 mmol/L (ref 3.5–5.1)
Potassium: 3.7 mmol/L (ref 3.5–5.1)
Sodium: 139 mmol/L (ref 135–145)
Sodium: 138 mmol/L (ref 135–145)

## 2015-02-11 LAB — CBC
HCT: 28.1 % — ABNORMAL LOW (ref 39.0–52.0)
HEMATOCRIT: 26.8 % — AB (ref 39.0–52.0)
HEMOGLOBIN: 9.1 g/dL — AB (ref 13.0–17.0)
Hemoglobin: 9.6 g/dL — ABNORMAL LOW (ref 13.0–17.0)
MCH: 30.5 pg (ref 26.0–34.0)
MCH: 31.1 pg (ref 26.0–34.0)
MCHC: 34 g/dL (ref 30.0–36.0)
MCHC: 34.2 g/dL (ref 30.0–36.0)
MCV: 89.9 fL (ref 78.0–100.0)
MCV: 90.9 fL (ref 78.0–100.0)
PLATELETS: 90 10*3/uL — AB (ref 150–400)
Platelets: 82 10*3/uL — ABNORMAL LOW (ref 150–400)
RBC: 2.98 MIL/uL — ABNORMAL LOW (ref 4.22–5.81)
RBC: 3.09 MIL/uL — ABNORMAL LOW (ref 4.22–5.81)
RDW: 17.6 % — AB (ref 11.5–15.5)
RDW: 17.7 % — AB (ref 11.5–15.5)
WBC: 10.3 10*3/uL (ref 4.0–10.5)
WBC: 10.1 10*3/uL (ref 4.0–10.5)

## 2015-02-11 LAB — KAPPA/LAMBDA LIGHT CHAINS
Kappa, lambda light chain ratio: 990.24 — ABNORMAL HIGH (ref 0.26–1.65)
Lambda free light chains: 10.66 mg/L (ref 5.71–26.30)

## 2015-02-11 MED ORDER — SODIUM CHLORIDE 0.9 % IV SOLN: 100.0000 mL | INTRAVENOUS | Status: DC | PRN

## 2015-02-11 MED ORDER — LIDOCAINE-PRILOCAINE 2.5-2.5 % EX CREA
1.0000 "application " | TOPICAL_CREAM | CUTANEOUS | Status: DC | PRN
  Filled 2015-02-11: qty 5

## 2015-02-11 MED ORDER — LIDOCAINE HCL (PF) 1 % IJ SOLN: 5.0000 mL | INTRAMUSCULAR | Status: DC | PRN

## 2015-02-11 MED ORDER — NEPRO/CARBSTEADY PO LIQD: 237.0000 mL | ORAL | Status: DC | PRN

## 2015-02-11 MED ORDER — HEPARIN SODIUM (PORCINE) 1000 UNIT/ML DIALYSIS
1000.0000 [IU] | INTRAMUSCULAR | Status: DC | PRN
  Filled 2015-02-11: qty 1

## 2015-02-11 MED ORDER — HEPARIN SODIUM (PORCINE) 1000 UNIT/ML DIALYSIS
20.0000 [IU]/kg | INTRAMUSCULAR | Status: DC | PRN
  Filled 2015-02-11: qty 2

## 2015-02-11 MED ORDER — ALTEPLASE 2 MG IJ SOLR
2.0000 mg | Freq: Once | INTRAMUSCULAR | Status: AC | PRN
  Filled 2015-02-11: qty 2

## 2015-02-11 MED ORDER — PENTAFLUOROPROP-TETRAFLUOROETH EX AERO: 1.0000 "application " | INHALATION_SPRAY | CUTANEOUS | Status: DC | PRN

## 2015-02-11 NOTE — Progress Notes (Signed)
1. AKI - Oliguric AKI . Renal biopsy with ATN and myeloma cast Nephropathy. S/P TPE x 7. Now on TTS shcedule for IHD. Repeat kappa free light chains pending for baseline post TPE. Velcade and Decadron started per Oncology.  Will need to  declare status of renal failure(AKI w/ recovery v ESRD).  It appears more likely longer term for recovery if poss. 2. Multiple myeloma 3. S/P L Cimino 4 R IJ PC  Subjective: Interval History: Minimal UOP.  Objective: Vital signs in last 24 hours: Temp:  [97.4 F (36.3 C)-98.8 F (37.1 C)] 98.8 F (37.1 C) (07/03 0843) Pulse Rate:  [51-102] 64 (07/03 0843) Resp:  [15-20] 20 (07/03 0843) BP: (104-162)/(35-80) 120/51 mmHg (07/03 0843) SpO2:  [91 %-100 %] 97 % (07/03 0843) Weight:  [89.3 kg (196 lb 13.9 oz)-93.7 kg (206 lb 9.1 oz)] 89.3 kg (196 lb 13.9 oz) (07/03 0114) Weight change: 0.405 kg (14.3 oz)  Intake/Output from previous day: 07/02 0701 - 07/03 0700 In: 2188 [P.O.:600; I.V.:100; Blood:1488] Out: 340 [Urine:100] Intake/Output this shift:   General appearance: alert and cooperative Resp: clear to auscultation bilaterally Extremities: extremities normal, atraumatic, no cyanosis or edema Abd.  Soft nontender  Lab Results:  Recent Labs  02/10/15 0315 02/11/15 0305  WBC 5.0 10.3  HGB 7.6* 9.1*  HCT 21.8* 26.8*  PLT 81* 82*   BMET:  Recent Labs  02/10/15 0315 02/11/15 0305  NA 137 139  K 4.6 3.9  CL 91* 98*  CO2 33* 30  GLUCOSE 196* 123*  BUN 38* 29*  CREATININE 7.80* 6.08*  CALCIUM 8.4* 8.0*   No results for input(s): PTH in the last 72 hours. Iron Studies: No results for input(s): IRON, TIBC, TRANSFERRIN, FERRITIN in the last 72 hours. Studies/Results: No results found.  Scheduled: . acyclovir  200 mg Oral BID  . amLODipine  5 mg Oral Daily  . antiseptic oral rinse  7 mL Mouth Rinse BID  . atenolol  25 mg Oral QHS  . budesonide-formoterol  2 puff Inhalation BID  . dexamethasone  40 mg Oral Weekly  .  fluticasone  1 spray Each Nare Daily     LOS: 12 days   Justin Woods C 02/11/2015,9:06 AM

## 2015-02-11 NOTE — Progress Notes (Signed)
Patient arrived on unit via wheelchair from [facility].  Wife at bedside.

## 2015-02-11 NOTE — Progress Notes (Signed)
Report called to RN 6E, patient having dinner. Will transfer after dinner.

## 2015-02-11 NOTE — Progress Notes (Signed)
[place] TEAM 1 - Stepdown/ICU TEAM Progress Note  Justin Woods MOQ:[id number] DOB: Nov 29, 1930 DOA: 01/30/2015 PCP: Wenda Low, MD  Admit HPI / Brief Narrative: 81yo male with history of HTN and renal stones who went to see his PCP c/o fatigue with decreased urine output.  Labs noted creatinine of 12.6 w/ baseline at PCP office in October 2015 of 1.17.  CT abdomen and pelvis did not show evidence of renal stones or hydronephrosis, only mild pre-ureteral stranding.  Patient was seen by Urology Dr.Herrick and had Foley catheter inserted with minimal urine output (total 150 mL).  Hospital Course: Despite ongoing care and assistance from Nephrology the patient's renal function failed to improve.  A renal biopsy was obtained.  Workup included an SPEP which revealed elevated kappa free light chains.  Oncology was consulted and a bone marrow biopsy was performed which confirmed the diagnosis of multiple myeloma.  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  The patient was treated with plasmapheresis 7 consecutive days.  Hemodialysis access was also accomplished (short-term as well as long-term) and hemodialysis was initiated.  Oncology initiated Velcade and Decadron therapy during the hospital stay.  The plan at this time is to transfer the patient to the renal floor and to continue hemodialysis on a Tuesday Thursday Saturday schedule.  It is our hope that the patient will enjoy some return of renal function in that a portion of his acute decline appears to have been related to ATN.  Nephrology continues to follow.  Oncology continues to follow.  HPI/Subjective: The patient is resting comfortably in a bedside chair.  He has had some issues with dependent edema that appeared to be due primarily due to the fact that he is sitting up in a chair more with his feet dangling.  He has been advised to keep his feet elevated even when sitting.  He  denies chest pain shortness of breath fevers chills nausea or vomiting.  Overall he appears to be in better spirits today and have more energy.  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/MM - Oncology has initiated Velcade and Decadron  Hyperkalemia - resolved Due to acute renal failure - corrected with hemodialysis  Acute hypoxic respiratory failure - pulmonary edema - resolved pulmonary edema related to acute renal failure - resolved with ongoing dialysis  Anemia Due to kidney failure + MM - s/p 2U transfusion in HD 7/2 - follow Hgb trend   Hypertension Blood pressure well controlled at present time  Prior history of DVT during hospitalization Unable to use anticoagulation of any type for 2 weeks post renal biopsy  COPD No evidence of acute exacerbation at present time  Chronic arthritis Denies focal joint pain at this time  Code Status: FULL Family Communication: No family present at time of exam today Disposition Plan: transfer to renal floor - begin ambulation/PT/OT - home once nature of renal failure has declared itself  Consultants: Nephrology  Hematology/Oncology Vasc Surgery   Procedures: 6/22 insertion L IJ temporary hemodialysis catheter 6/22 HD initiated  6/24 kidney bx  6/25 plasmapheresis initiated 6/27 BMbx 6/29 R IJ tunneled HD cath + L radiocephalic AV fistula   Antibiotics: None   DVT prophylaxis: SCDs (post renal bx)  Objective: Blood pressure 120/51, pulse 64, temperature 98.8 F (37.1 C), temperature source Oral,  resp. rate 20, height $RemoveBe'5\' 6"'tGTdspTzR$  (1.676 m), weight 89.3 kg (196 lb 13.9 oz), SpO2 97 %.  Intake/Output Summary (Last 24 hours) at 02/11/15 1351 Last data filed at 02/10/15 1805  Gross per 24 hour  Intake    480 ml  Output      0 ml  Net    480 ml    Exam: General: No acute respiratory distress - alert / pleasant  Lungs: Clear to auscultation throughout without wheezes or focal crackles Cardiovascular: Regular rate & rhythm without appreciable murmur or rub Abdomen: Nontender, mildly protuberant, soft, bowel sounds positive, no rebound, no ascites, no mass Extremities: No significant cyanosis, clubbing; 2+ pedal edema bilateral lower extremities while sitting up in chair with feet dangling  Data Reviewed: Basic Metabolic Panel:  Recent Labs Lab 02/06/15 0810  02/07/15 0307  02/09/15 0956 02/09/15 1015 02/10/15 0315 02/11/15 0305 02/11/15 1020  NA 140  < > 142  < > 139 142 137 139 138  K 4.2  < > 4.0  < > 3.8 3.8 4.6 3.9 3.7  CL 95*  < > 99*  < > 90* 94* 91* 98* 97*  CO2 29  --  29  --   --  35* 33* 30 28  GLUCOSE 90  < > 96  < > 113* 116* 196* 123* 105*  BUN 117*  < > 58*  < > 27* 26* 38* 29* 36*  CREATININE 12.38*  < > 8.35*  < > 6.50* 6.55* 7.80* 6.08* 6.73*  CALCIUM 8.3*  --  8.0*  --   --  7.7* 8.4* 8.0* 8.1*  PHOS 13.6*  --   --   --   --  7.4* 8.7* 5.9* 6.4*  < > = values in this interval not displayed.  CBC:  Recent Labs Lab 02/06/15 0811  02/07/15 0307  02/09/15 0956 02/09/15 1015 02/10/15 0315 02/11/15 0305 02/11/15 1020  WBC 8.4  --  6.5  --   --  6.2 5.0 10.3 10.1  NEUTROABS 4.9  --   --   --   --   --   --   --   --   HGB 9.6*  < > 9.1*  < > 7.5* 7.9* 7.6* 9.1* 9.6*  HCT 27.1*  < > 25.6*  < > 22.0* 22.9* 21.8* 26.8* 28.1*  MCV 93.4  --  93.8  --   --  96.2 95.2 89.9 90.9  PLT 93*  --  79*  --   --  87* 81* 82* 90*  < > = values in this interval not displayed.  Liver Function Tests:  Recent Labs Lab 02/06/15 0810 02/09/15 1015 02/10/15 0315  02/11/15 0305 02/11/15 1020  ALBUMIN 2.7* 2.7* 2.9* 2.8* 2.9*    No results found for this or any previous visit (from the past 240 hour(s)).   Studies:   Recent x-ray studies have been reviewed in detail by the Attending Physician  Scheduled Meds:  Scheduled Meds: . acyclovir  200 mg Oral BID  . amLODipine  5 mg Oral Daily  . antiseptic oral rinse  7 mL Mouth Rinse BID  . atenolol  25 mg Oral QHS  . budesonide-formoterol  2 puff Inhalation BID  .  dexamethasone  40 mg Oral Weekly  . fluticasone  1 spray Each Nare Daily    Time spent on care of this patient: 35 mins   Manna Gose T , MD   [facility] Office  [phone] Pager - Text Page per Shea Evans as per below:  On-Call/Text Page:      [email or web address]      password [email or web address]  If 7PM-7AM, please contact night-coverage [email or web address] Password [email or web address] 02/11/2015, 1:51 PM   LOS: 12 days

## 2015-02-12 DIAGNOSIS — I82409 Acute embolism and thrombosis of unspecified deep veins of unspecified lower extremity: Secondary | ICD-10-CM

## 2015-02-12 LAB — CBC
HCT: 27.2 % — ABNORMAL LOW (ref 39.0–52.0)
Hemoglobin: 9.3 g/dL — ABNORMAL LOW (ref 13.0–17.0)
MCH: 31 pg (ref 26.0–34.0)
MCHC: 34.2 g/dL (ref 30.0–36.0)
MCV: 90.7 fL (ref 78.0–100.0)
Platelets: 106 10*3/uL — ABNORMAL LOW (ref 150–400)
RBC: 3 MIL/uL — AB (ref 4.22–5.81)
RDW: 17 % — ABNORMAL HIGH (ref 11.5–15.5)
WBC: 9.7 10*3/uL (ref 4.0–10.5)

## 2015-02-12 LAB — RENAL FUNCTION PANEL
ALBUMIN: 2.8 g/dL — AB (ref 3.5–5.0)
ANION GAP: 15 (ref 5–15)
BUN: 54 mg/dL — AB (ref 6–20)
CALCIUM: 7.5 mg/dL — AB (ref 8.9–10.3)
CO2: 24 mmol/L (ref 22–32)
CREATININE: 8.63 mg/dL — AB (ref 0.61–1.24)
Chloride: 96 mmol/L — ABNORMAL LOW (ref 101–111)
GFR calc Af Amer: 6 mL/min — ABNORMAL LOW (ref >60)
GFR, EST NON AFRICAN AMERICAN: 5 mL/min — AB (ref >60)
Glucose, Bld: 78 mg/dL (ref 65–99)
POTASSIUM: 4.5 mmol/L (ref 3.5–5.1)
Phosphorus: 7.2 mg/dL — ABNORMAL HIGH (ref 2.5–4.6)
SODIUM: 135 mmol/L (ref 135–145)

## 2015-02-12 MED ORDER — LANTHANUM CARBONATE 500 MG PO CHEW
500.0000 mg | CHEWABLE_TABLET | Freq: Three times a day (TID) | ORAL | Status: DC
  Administered 2015-02-13 – 2015-02-16 (×10): 500 mg via ORAL
  Filled 2015-02-12 (×12): qty 1

## 2015-02-12 MED ORDER — DARBEPOETIN ALFA 60 MCG/0.3ML IJ SOSY
60.0000 ug | PREFILLED_SYRINGE | INTRAMUSCULAR | Status: DC
  Administered 2015-02-13: 60 ug via INTRAVENOUS
  Filled 2015-02-12: qty 0.3

## 2015-02-12 MED ORDER — METHOCARBAMOL 500 MG PO TABS
500.0000 mg | ORAL_TABLET | Freq: Three times a day (TID) | ORAL | Status: DC | PRN
Start: 2015-02-12 — End: 2015-02-16

## 2015-02-12 NOTE — Progress Notes (Signed)
Progress Note  Justin Woods FSE:[id number] DOB: 08-Jun-1931 DOA: 01/30/2015 PCP: Georgann Housekeeper, MD  Admit HPI / Brief Narrative: 81yo male with history of HTN and renal stones who went to see his PCP c/o fatigue with decreased urine output.  Labs noted creatinine of 12.6 w/ baseline at PCP office in October 2015 of 1.17.  CT abdomen and pelvis did not show evidence of renal stones or hydronephrosis, only mild pre-ureteral stranding.  Patient was seen by Urology Dr.Herrick and had Foley catheter inserted with minimal urine output (total 150 mL).  Hospital Course: Despite ongoing care and assistance from Nephrology the patient's renal function failed to improve.  A renal biopsy was obtained.  Workup included an SPEP which revealed elevated kappa free light chains.  Oncology was consulted and a bone marrow biopsy was performed which confirmed the diagnosis of multiple myeloma.  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  The patient was treated with plasmapheresis 7 consecutive days.  Hemodialysis access was also accomplished (short-term as well as long-term) and hemodialysis was initiated.  Oncology initiated Velcade and Decadron therapy during the hospital stay.  The plan at this time is to transfer the patient to the renal floor and to continue hemodialysis on a Tuesday Thursday Saturday schedule.  It is our hope that the patient will enjoy some return of renal function in that a portion of his acute decline appears to have been related to ATN.  Nephrology continues to follow.  Oncology continues to follow.  HPI/Subjective: The patient is afebrile, denies CP, SOB, nausea, vomiting and abd pain. He reports appetite is better and his legs are less swollen.  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/MM - Oncology has initiated Velcade and Decadron -will follow oncology rec's -patient tolerating treatment   Hyperkalemia - resolved -Due to acute renal failure  - corrected with hemodialysis -will monitor electrolytes  Acute hypoxic respiratory failure - pulmonary edema - resolved -pulmonary edema related to acute renal failure -resolved with dialysis -will follow daily weights and intake/output -patient advised to follow low sodium diet  Anemia -Due to kidney failure + MM - s/p 2U transfusion in HD 7/2  -follow Hgb trend  -renal to decide on ESA and IV iron  Hypertension -Blood pressure well controlled at present time; will continue current antihypertensive regimen  Prior history of DVT during hospitalization -Unable to use anticoagulation of any type for 2 weeks post renal biopsy -will add TED-Hoses  COPD -No evidence of acute exacerbation at present time  Chronic arthritis -Denies focal joint pain at this time  Code Status: FULL Family Communication: No family present at time of exam today Disposition Plan: remains inpatient. Continue HD therapy and initiate clipping process  Consultants: Nephrology  Hematology/Oncology Vasc Surgery   Procedures: 6/22 insertion L IJ temporary hemodialysis catheter 6/22 HD initiated  6/24 kidney bx 6/25 plasmapheresis initiated 6/27 BMbx 6/29 R IJ tunneled HD cath + L radiocephalic AV fistula   Antibiotics: None  DVT prophylaxis: SCDs (post renal bx)  Objective: Blood pressure 154/64, pulse 70, temperature 98.1 F (36.7 C), temperature source Oral, resp.  rate 18, height $RemoveBe'5\' 6"'ggpZSnqhD$  (1.676 m), weight 93.441 kg (206 lb), SpO2 92 %.  Intake/Output Summary (Last 24 hours) at 02/12/15 2303 Last data filed at 02/12/15 1700  Gross per 24 hour  Intake    840 ml  Output    502 ml  Net    338 ml    Exam: General: No acute respiratory distress - alert / pleasant. Denies CP and SOB. No fever Lungs: Clear to auscultation throughout without wheezes or focal crackles Cardiovascular: Regular rate & rhythm, no rubs or gallops Abdomen: Nontender, mildly protuberant, soft, bowel sounds positive, no rebound, no ascites, no mass Extremities: No significant cyanosis, clubbing; 1-2+ pedal edema bilateral lower extremities while sitting up in chair with feet dangling  Data Reviewed: Basic Metabolic Panel:  Recent Labs Lab 02/09/15 1015 02/10/15 0315 02/11/15 0305 02/11/15 1020 02/12/15 0540  NA 142 137 139 138 135  K 3.8 4.6 3.9 3.7 4.5  CL 94* 91* 98* 97* 96*  CO2 35* 33* $Remov'30 28 24  'GDXiRu$ GLUCOSE 116* 196* 123* 105* 78  BUN 26* 38* 29* 36* 54*  CREATININE 6.55* 7.80* 6.08* 6.73* 8.63*  CALCIUM 7.7* 8.4* 8.0* 8.1* 7.5*  PHOS 7.4* 8.7* 5.9* 6.4* 7.2*    CBC:  Recent Labs Lab 02/06/15 0811  02/09/15 1015 02/10/15 0315 02/11/15 0305 02/11/15 1020 02/12/15 0540  WBC 8.4  < > 6.2 5.0 10.3 10.1 9.7  NEUTROABS 4.9  --   --   --   --   --   --   HGB 9.6*  < > 7.9* 7.6* 9.1* 9.6* 9.3*  HCT 27.1*  < > 22.9* 21.8* 26.8* 28.1* 27.2*  MCV 93.4  < > 96.2 95.2 89.9 90.9 90.7  PLT 93*  < > 87* 81* 82* 90* 106*  < > = values in this interval not displayed.  Liver Function Tests:  Recent Labs Lab 02/09/15 1015 02/10/15 0315 02/11/15 0305 02/11/15 1020 02/12/15 0540  ALBUMIN 2.7* 2.9* 2.8* 2.9* 2.8*    Studies:   Recent x-ray studies have been reviewed in detail by the Attending Physician  Scheduled Meds:  Scheduled Meds: . acyclovir  200 mg Oral BID  . amLODipine  5 mg Oral Daily  . antiseptic oral rinse  7 mL Mouth Rinse BID  . atenolol  25 mg  Oral QHS  . budesonide-formoterol  2 puff Inhalation BID  . [START ON 02/13/2015] darbepoetin (ARANESP) injection - DIALYSIS  60 mcg Intravenous Q Tue-HD  . dexamethasone  40 mg Oral Weekly  . fluticasone  1 spray Each Nare Daily  . lanthanum  500 mg Oral TID WC    Time spent on care of this patient: 35 mins   Barton Dubois , MD  [phone]   LOS: 13 days

## 2015-02-12 NOTE — Progress Notes (Signed)
Patient ID: Justin Woods, male   DOB: [birth year], 81 y.o.   MRN: [id number]   [facility] Progress Note   Assessment/ Plan:   1. Oliguric acute renal failure-dialysis dependent. With minimal urine output and no indications for renal recovery as yet. 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 He has received 7 treatments of plasmapheresis in order try and reduce light chain burden and has been started on chemotherapy for multiple myeloma. He is status post left radiocephalic fistula placement and has a right IJ tunnel dialysis catheter. Start the process for outpatient dialysis unit placement tomorrow. 2. Multiple myeloma: Started on chemotherapy with Velcade and Decadron 3. Anemia: Secondary to renal failure/multiple myeloma, reassess ESA dosing 4. Secondary hyperparathyroidism: Hyperphosphatemia noted on his recent labs, will start phosphorus binder.  Subjective:   Reports to be bothered by constipation overnight-had a suppository placed last evening.    Objective:   BP 139/60 mmHg  Pulse 65  Temp(Src) 97.8 F (36.6 C) (Oral)  Resp 18  Ht _0  (1.676 m)  Wt 93.441 kg (206 lb)  BMI 33.27 kg/m2  SpO2 94%  Physical Exam: Gen: On comfortably resting in recliner-wife at bedside CVS: Pulse regular in rate and rhythm, S1 and S2 normal Resp: Clear to auscultation, no rales Abd: Soft, obese, mildly tender over lower quadrants Ext: 1+ lower extremity edema  Labs: BMET  Recent Labs Lab 02/06/15 0810  02/07/15 0307 02/08/15 2011 02/09/15 0956 02/09/15 1015 02/10/15 0315 02/11/15 0305 02/11/15 1020 02/12/15 0540  NA 140  < > 142 137 139 142 137 139 138 135  K 4.2  < > 4.0 3.1* 3.8 3.8 4.6 3.9 3.7 4.5  CL 95*  < > 99* 96* 90* 94* 91* 98* 97* 96*  CO2 29  --  29  --   --  35* 33* _1 GLUCOSE 90  < > 96 82 113* 116* 196* 123* 105* 78  BUN  117*  < > 58* 18 27* 26* 38* 29* 36* 54*  CREATININE 12.38*  < > 8.35* 3.70* 6.50* 6.55* 7.80* 6.08* 6.73* 8.63*  CALCIUM 8.3*  --  8.0*  --   --  7.7* 8.4* 8.0* 8.1* 7.5*  PHOS 13.6*  --   --   --   --  7.4* 8.7* 5.9* 6.4* 7.2*  < > = values in this interval not displayed. CBC  Recent Labs Lab 02/06/15 0811  02/10/15 0315 02/11/15 0305 02/11/15 1020 02/12/15 0540  WBC 8.4  < > 5.0 10.3 10.1 9.7  NEUTROABS 4.9  --   --   --   --   --   HGB 9.6*  < > 7.6* 9.1* 9.6* 9.3*  HCT 27.1*  < > 21.8* 26.8* 28.1* 27.2*  MCV 93.4  < > 95.2 89.9 90.9 90.7  PLT 93*  < > 81* 82* 90* 106*  < > = values in this interval not displayed.  Medications:    . acyclovir  200 mg Oral BID  . amLODipine  5 mg Oral Daily  . antiseptic oral rinse  7 mL Mouth Rinse BID  . atenolol  25 mg Oral QHS  . budesonide-formoterol  2 puff Inhalation BID  . dexamethasone  40 mg Oral Weekly  . fluticasone  1 spray Each Nare Daily   Elmarie Shiley, MD 02/12/2015, 10:41 AM

## 2015-02-12 NOTE — Care Management Note (Signed)
Case Management Note  Patient Details  Name: GEDALYA JIM MRN: [id number] Date of Birth: 1930-10-26  Subjective/Objective:                    Action/Plan:   Expected Discharge Date:   (UNKNOWN)               Expected Discharge Plan:  [place]  In-House Referral:     Discharge planning Services  CM Consult  Post Acute Care Choice:  Resumption of Svcs/PTA Provider Choice offered to:     DME Arranged:    DME Agency:     HH Arranged:  Disease Management [place] Agency:  [place]  Status of Service:  Completed, signed off  Medicare Important Message Given:  Yes-third notification given Date Medicare IM Given:  02/02/15 Medicare IM give by:  Babette Relic RN Date Additional Medicare IM Given:   02/12/15 Additional Medicare Important Message give by:    Norina Buzzard, RN, BSN  If discussed at Long Length of Stay Meetings, dates discussed:    Additional Comments:  Norina Buzzard, RN 02/12/2015, 12:54 PM

## 2015-02-13 DIAGNOSIS — J438 Other emphysema: Secondary | ICD-10-CM

## 2015-02-13 LAB — CBC
HEMATOCRIT: 27.1 % — AB (ref 39.0–52.0)
Hemoglobin: 9.2 g/dL — ABNORMAL LOW (ref 13.0–17.0)
MCH: 30.7 pg (ref 26.0–34.0)
MCHC: 33.9 g/dL (ref 30.0–36.0)
MCV: 90.3 fL (ref 78.0–100.0)
PLATELETS: 117 10*3/uL — AB (ref 150–400)
RBC: 3 MIL/uL — ABNORMAL LOW (ref 4.22–5.81)
RDW: 16.3 % — ABNORMAL HIGH (ref 11.5–15.5)
WBC: 8.5 10*3/uL (ref 4.0–10.5)

## 2015-02-13 LAB — RENAL FUNCTION PANEL
Albumin: 2.9 g/dL — ABNORMAL LOW (ref 3.5–5.0)
Anion gap: 16 — ABNORMAL HIGH (ref 5–15)
BUN: 68 mg/dL — AB (ref 6–20)
CALCIUM: 7.8 mg/dL — AB (ref 8.9–10.3)
CO2: 26 mmol/L (ref 22–32)
CREATININE: 10.83 mg/dL — AB (ref 0.61–1.24)
Chloride: 96 mmol/L — ABNORMAL LOW (ref 101–111)
GFR calc Af Amer: 4 mL/min — ABNORMAL LOW (ref >60)
GFR, EST NON AFRICAN AMERICAN: 4 mL/min — AB (ref >60)
GLUCOSE: 92 mg/dL (ref 65–99)
Phosphorus: 8.1 mg/dL — ABNORMAL HIGH (ref 2.5–4.6)
Potassium: 4 mmol/L (ref 3.5–5.1)
Sodium: 138 mmol/L (ref 135–145)

## 2015-02-13 MED ORDER — DARBEPOETIN ALFA 60 MCG/0.3ML IJ SOSY
PREFILLED_SYRINGE | INTRAMUSCULAR | Status: AC
  Filled 2015-02-13: qty 0.3

## 2015-02-13 MED ORDER — ANTICOAGULANT SODIUM CITRATE 4% (200MG/5ML) IV SOLN
5.0000 mL | Status: AC
  Administered 2015-02-13: 5 mL via INTRAVENOUS
  Filled 2015-02-13 (×2): qty 250

## 2015-02-13 NOTE — Progress Notes (Signed)
Physical Therapy Treatment Patient Details Name: Justin Woods MRN: [id number] DOB: 1931/07/18 Today's Date: 02/13/2015    History of Present Illness 81 yo with new HD and plasmapharesis, chemo precautions    PT Comments    Pt is new to this hallway and both obtained a walker for his use and instructed he and wife that help will  Be needed.  CNA was available to instruct for safe assistance of walker.  He is having some difficulty with turns but is otherwise S to min guard for gait.  Follow Up Recommendations  Home health PT;Supervision for mobility/OOB     Equipment Recommendations  None recommended by PT    Recommendations for Other Services       Precautions / Restrictions Precautions Precautions: Fall Precaution Comments: chemo  Restrictions Weight Bearing Restrictions: No    Mobility  Bed Mobility               General bed mobility comments: up when PT entered  Transfers Overall transfer level: Modified independent Equipment used: Rolling walker (2 wheeled)             General transfer comment: pt took incr time to achieve full stand but did not need help.  Ambulation/Gait Ambulation/Gait assistance: Min guard Ambulation Distance (Feet): 250 Feet Assistive device: Rolling walker (2 wheeled) Gait Pattern/deviations: Step-through pattern;Wide base of support Gait velocity: normal Gait velocity interpretation: at or above normal speed for age/gender General Gait Details: Needed guarding for turns as he has hesitation to corner on RLE and drifted off to R, but told this to CNA for when she walks him   Stairs            Wheelchair Mobility    Modified Rankin (Stroke Patients Only)       Balance Overall balance assessment: Needs assistance Sitting-balance support: Feet supported Sitting balance-Leahy Scale: Good     Standing balance support: Bilateral upper extremity supported Standing balance-Leahy Scale: Fair Standing balance  comment: fair- dynamic balance                    Cognition Arousal/Alertness: Awake/alert Behavior During Therapy: WFL for tasks assessed/performed Overall Cognitive Status: Within Functional Limits for tasks assessed                      Exercises      General Comments General comments (skin integrity, edema, etc.): Has some balance changes but he perceives no issues.  Will expect that he is going to receive some follow up therapy but pt is declining it      Pertinent Vitals/Pain Pain Assessment: No/denies pain    Home Living                      Prior Function            PT Goals (current goals can now be found in the care plan section) Acute Rehab PT Goals Patient Stated Goal: home Progress towards PT goals: Progressing toward goals    Frequency  Min 3X/week    PT Plan Current plan remains appropriate    Co-evaluation             End of Session Equipment Utilized During Treatment: Gait belt Activity Tolerance: Patient tolerated treatment well Patient left: in chair;with call bell/phone within reach;with family/visitor present     Time: 2409-7353 PT Time Calculation (min) (ACUTE ONLY): 25 min  Charges:  $Gait Training: 8-22  mins $Therapeutic Activity: 8-22 mins                    G Codes:      Ramond Dial 03/14/15, 3:08 PM   Mee Hives, PT MS Acute Rehab Dept. Number: [phone] [phone] and [place] [phone]

## 2015-02-13 NOTE — Procedures (Signed)
Patient seen on Hemodialysis. QB 400, UF goal 3.5L Treatment adjusted as needed.  Elmarie Shiley MD [facility] Office # [phone] Pager # [phone] 8:16 AM

## 2015-02-13 NOTE — Progress Notes (Signed)
Patient ID: Justin Woods, male   DOB: 09/15/1930, 81 y.o.   MRN: [id number]  [facility] Progress Note    Assessment/ Plan:   1. Oliguric acute renal failure-dialysis dependent--now likely ESRD. With minimal urine output and no indications for renal recovery as yet. 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 He has received 7 treatments of plasmapheresis in order try and reduce light chain burden and has been started on chemotherapy for multiple myeloma. He is status post left radiocephalic fistula placement and has a right IJ tunnel dialysis catheter. Start the process for outpatient dialysis unit placement today. 2. Multiple myeloma: Started on chemotherapy with Velcade and Decadron 3. Anemia: Secondary to renal failure/multiple myeloma, on ESA 4. Secondary hyperparathyroidism: Hyperphosphatemia noted on his recent labs, now on fosrenol.  Subjective:   Reports to be feeling fair and delays any SOB/CP   Objective:   BP 152/72 mmHg  Pulse 73  Temp(Src) 98.4 F (36.9 C) (Oral)  Resp 18  Ht 5' 6" (1.676 m)  Wt 93.9 kg (207 lb 0.2 oz)  BMI 33.43 kg/m2  SpO2 92%  Intake/Output Summary (Last 24 hours) at 02/13/15 0817 Last data filed at 02/13/15 0600  Gross per 24 hour  Intake    960 ml  Output    501 ml  Net    459 ml   Weight change:   Physical Exam: Gen:comfortably resting on HD CVS:Pulse RRR, normal s1 and s2 Resp:CTA bilaterally, no rales/rhonchi Abd:soft, obese, NT, BS normal Ext:trace-1+ LE edema  Imaging: No results found.  Labs: BMET  Recent Labs Lab 02/07/15 0307 02/08/15 2011 02/09/15 0956 02/09/15 1015 02/10/15 0315 02/11/15 0305 02/11/15 1020 02/12/15 0540  NA 142 137 139 142 137 139 138 135  K 4.0 3.1* 3.8 3.8 4.6 3.9 3.7 4.5  CL 99* 96* 90* 94* 91* 98* 97* 96*  CO2 29  --   --  35* 33* 30 28 24  GLUCOSE 96 82  113* 116* 196* 123* 105* 78  BUN 58* 18 27* 26* 38* 29* 36* 54*  CREATININE 8.35* 3.70* 6.50* 6.55* 7.80* 6.08* 6.73* 8.63*  CALCIUM 8.0*  --   --  7.7* 8.4* 8.0* 8.1* 7.5*  PHOS  --   --   --  7.4* 8.7* 5.9* 6.4* 7.2*   CBC  Recent Labs Lab 02/10/15 0315 02/11/15 0305 02/11/15 1020 02/12/15 0540  WBC 5.0 10.3 10.1 9.7  HGB 7.6* 9.1* 9.6* 9.3*  HCT 21.8* 26.8* 28.1* 27.2*  MCV 95.2 89.9 90.9 90.7  PLT 81* 82* 90* 106*    Medications:    . acyclovir  200 mg Oral BID  . amLODipine  5 mg Oral Daily  . antiseptic oral rinse  7 mL Mouth Rinse BID  . atenolol  25 mg Oral QHS  . budesonide-formoterol  2 puff Inhalation BID  . darbepoetin (ARANESP) injection - DIALYSIS  60 mcg Intravenous Q Tue-HD  . dexamethasone  40 mg Oral Weekly  . fluticasone  1 spray Each Nare Daily  . lanthanum  500 mg Oral TID WC   Jay Patel, MD 02/13/2015, 8:17 AM   

## 2015-02-13 NOTE — Progress Notes (Signed)
Progress Note  JAMARRIUS SALAY XYV:[id number] DOB: [birth year] DOA: 01/30/2015 PCP: Wenda Low, MD  Admit HPI / Brief Narrative: 81yo male with history of HTN and renal stones who went to see his PCP c/o fatigue with decreased urine output.  Labs noted creatinine of 12.6 w/ baseline at PCP office in October 2015 of 1.17.  CT abdomen and pelvis did not show evidence of renal stones or hydronephrosis, only mild pre-ureteral stranding.  Patient was seen by Urology Dr. Louis Meckel and had Foley catheter inserted with minimal urine output (total 150 mL).  Hospital Course: Despite ongoing care and assistance from Nephrology the patient's renal function failed to improve.  A renal biopsy was obtained.  Workup included an SPEP which revealed elevated kappa free light chains.  Oncology was consulted and a bone marrow biopsy was performed which confirmed the diagnosis of multiple myeloma.  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  The patient was treated with plasmapheresis 7 consecutive days.  Hemodialysis access was also accomplished (short-term as well as long-term) and hemodialysis was initiated.  Oncology initiated Velcade and Decadron therapy during the hospital stay.  The plan at this time is to transfer the patient to the renal floor and to continue hemodialysis on a Tuesday Thursday Saturday schedule.  It is our hope that the patient will enjoy some return of renal function in that a portion of his acute decline appears to have been related to ATN.  Nephrology continues to follow.  Oncology continues to follow.  HPI/Subjective: The patient is afebrile, denies CP, SOB, nausea, vomiting and abd pain. Seen during HD. No major distress.  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 - Oncology has initiated Velcade and Decadron -will follow oncology rec's -patient tolerating treatment   Hyperkalemia - resolved -Due to acute renal failure  -will monitor electrolytes; currently addressed with HD  Acute hypoxic respiratory failure - pulmonary edema - resolved -pulmonary edema related to acute renal failure -resolved with dialysis -will follow daily weights and intake/output -patient advised to follow low sodium diet  Anemia -Due to kidney failure + MM - s/p 2U transfusion in HD 7/2  -follow Hgb trend, stable and not requiring transfusion currently  -renal to decide on ESA and IV iron  Hypertension -Blood pressure well controlled at present time; will continue current antihypertensive regimen  Prior history of DVT during hospitalization -Unable to use anticoagulation of any type for 2 weeks post renal biopsy -will continue TED-Hoses  COPD -No evidence of acute exacerbation at present time  Chronic arthritis -Denies focal joint pain at this time  Code Status: FULL Family Communication: No family present at time of exam today Disposition Plan: remains inpatient. Continue HD therapy and initiate clipping process  Consultants: Nephrology  Hematology/Oncology Vasc Surgery   Procedures: 6/22 insertion L IJ temporary hemodialysis catheter 6/22 HD initiated  6/24 kidney bx 6/25 plasmapheresis initiated 6/27 BMbx 6/29 R IJ tunneled HD cath + L radiocephalic  AV fistula   Antibiotics: None   DVT prophylaxis: SCDs (post renal bx)  Objective: Blood pressure 141/59, pulse  70, temperature 98.8 F (37.1 C), temperature source Oral, resp. rate 18, height $RemoveBe'5\' 6"'ZlJRSwCIn$  (1.676 m), weight 90.5 kg (199 lb 8.3 oz), SpO2 96 %.  Intake/Output Summary (Last 24 hours) at 02/13/15 1907 Last data filed at 02/13/15 1700  Gross per 24 hour  Intake    480 ml  Output   3000 ml  Net  -2520 ml    Exam: General: No acute respiratory distress, afebrile, no CP. Tolerating HD w/o problems Lungs: Clear to auscultation throughout without wheezes or focal crackles Cardiovascular: Regular rate & rhythm, no rubs or gallops Abdomen: Nontender, mildly protuberant, soft, bowel sounds positive, no rebound, no ascites, no mass Extremities: No significant cyanosis, clubbing; 1+ pedal edema bilateral lower extremities while sitting up in chair with feet dangling  Data Reviewed: Basic Metabolic Panel:  Recent Labs Lab 02/10/15 0315 02/11/15 0305 02/11/15 1020 02/12/15 0540 02/13/15 0834  NA 137 139 138 135 138  K 4.6 3.9 3.7 4.5 4.0  CL 91* 98* 97* 96* 96*  CO2 33* $Remov'30 28 24 26  'xTmYMc$ GLUCOSE 196* 123* 105* 78 92  BUN 38* 29* 36* 54* 68*  CREATININE 7.80* 6.08* 6.73* 8.63* 10.83*  CALCIUM 8.4* 8.0* 8.1* 7.5* 7.8*  PHOS 8.7* 5.9* 6.4* 7.2* 8.1*    CBC:  Recent Labs Lab 02/10/15 0315 02/11/15 0305 02/11/15 1020 02/12/15 0540 02/13/15 0833  WBC 5.0 10.3 10.1 9.7 8.5  HGB 7.6* 9.1* 9.6* 9.3* 9.2*  HCT 21.8* 26.8* 28.1* 27.2* 27.1*  MCV 95.2 89.9 90.9 90.7 90.3  PLT 81* 82* 90* 106* 117*    Liver Function Tests:  Recent Labs Lab 02/10/15 0315 02/11/15 0305 02/11/15 1020 02/12/15 0540 02/13/15 0834  ALBUMIN 2.9* 2.8* 2.9* 2.8* 2.9*    Studies:   Recent x-ray studies have been reviewed in detail by the Attending Physician  Scheduled Meds:  Scheduled Meds: . acyclovir  200 mg Oral BID  . amLODipine  5 mg Oral Daily  . antiseptic oral rinse  7 mL Mouth Rinse BID  . atenolol  25 mg Oral QHS  . budesonide-formoterol  2 puff Inhalation BID  . darbepoetin (ARANESP)  injection - DIALYSIS  60 mcg Intravenous Q Tue-HD  . dexamethasone  40 mg Oral Weekly  . fluticasone  1 spray Each Nare Daily  . lanthanum  500 mg Oral TID WC    Time spent on care of this patient: 35 mins   Barton Dubois , MD  [phone]   LOS: 14 days

## 2015-02-13 NOTE — Care Management (Signed)
Important Message  Patient Details  Name: Justin Woods MRN: [id number] Date of Birth: 10-21-1930   Medicare Important Message Given:  Yes-fourth notification given    Loann Quill 02/13/2015, 11:13 AM

## 2015-02-14 NOTE — Progress Notes (Signed)
Physical Therapy Treatment Patient Details Name: Justin Woods MRN: [id number] DOB: [birth year] Today's Date: 02/14/2015    History of Present Illness 81 yo with new HD and plasmapharesis, chemo precautions    PT Comments    Pt with all goals met. Encourage to cont ambulating with wife and nursing on unit with use of rolling walker.   Follow Up Recommendations  Home health PT;Supervision for mobility/OOB     Equipment Recommendations  None recommended by PT    Recommendations for Other Services       Precautions / Restrictions Precautions Precautions: Fall Precaution Comments: chemo  Restrictions Weight Bearing Restrictions: No    Mobility  Bed Mobility               General bed mobility comments: up in chair  Transfers Overall transfer level: Needs assistance Equipment used: Rolling walker (2 wheeled) Transfers: Sit to/from Stand Sit to Stand: Min guard         General transfer comment: pt with difficulty powering up; min guard to steady; required 2 attempts   Ambulation/Gait Ambulation/Gait assistance: Supervision Ambulation Distance (Feet): 200 Feet Assistive device: Rolling walker (2 wheeled) Gait Pattern/deviations: Step-through pattern;Decreased stride length;Wide base of support Gait velocity: normal Gait velocity interpretation: at or above normal speed for age/gender General Gait Details: supervision for safety; required RW to steady   Stairs Stairs: Yes Stairs assistance: Modified independent (Device/Increase time) Stair Management: Step to pattern;Forwards;One rail Right Number of Stairs: 3 General stair comments: supervision for safety for stair managment   Wheelchair Mobility    Modified Rankin (Stroke Patients Only)       Balance Overall balance assessment: Needs assistance         Standing balance support: During functional activity;No upper extremity supported Standing balance-Leahy Scale: Fair Standing balance  comment: dynamic standing balance fair                     Cognition Arousal/Alertness: Awake/alert Behavior During Therapy: WFL for tasks assessed/performed Overall Cognitive Status: Within Functional Limits for tasks assessed                      Exercises      General Comments General comments (skin integrity, edema, etc.): difficulty powering up; bracing LEs to balance with initial sit to stand      Pertinent Vitals/Pain Pain Assessment: No/denies pain    Home Living                      Prior Function            PT Goals (current goals can now be found in the care plan section) Acute Rehab PT Goals Patient Stated Goal: to go home tomorrow PT Goal Formulation: With patient Time For Goal Achievement: 02/08/15 Potential to Achieve Goals: Good Progress towards PT goals: Progressing toward goals    Frequency  Min 3X/week    PT Plan Current plan remains appropriate    Co-evaluation             End of Session Equipment Utilized During Treatment: Gait belt Activity Tolerance: Patient tolerated treatment well Patient left: in chair;with call bell/phone within reach;with family/visitor present     Time: 1200-1210 PT Time Calculation (min) (ACUTE ONLY): 10 min  Charges:  $Gait Training: 8-22 mins                    G Codes:  [place], [place] N PT  02/14/2015, 1:01 PM

## 2015-02-14 NOTE — Progress Notes (Signed)
Progress Note  Justin Woods OAC:[id number] DOB: 04-16-1931 DOA: 01/30/2015 PCP: Wenda Low, MD  Admit HPI / Brief Narrative: 81yo male with history of HTN and renal stones who went to see his PCP c/o fatigue with decreased urine output.  Labs noted creatinine of 12.6 w/ baseline at PCP office in October 2015 of 1.17.  CT abdomen and pelvis did not show evidence of renal stones or hydronephrosis, only mild pre-ureteral stranding.  Patient was seen by Urology Dr. Louis Meckel and had Foley catheter inserted with minimal urine output (total 150 mL).  Hospital Course: Despite ongoing care and assistance from Nephrology the patient's renal function failed to improve.  A renal biopsy was obtained.  Workup included an SPEP which revealed elevated kappa free light chains.  Oncology was consulted and a bone marrow biopsy was performed which confirmed the diagnosis of multiple myeloma.  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  The patient was treated with plasmapheresis 7 consecutive days.  Hemodialysis access was also accomplished (short-term as well as long-term) and hemodialysis was initiated.  Oncology initiated Velcade and Decadron therapy during the hospital stay.  The plan at this time is to transfer the patient to the renal floor and to continue hemodialysis on a Tuesday Thursday Saturday schedule.  It is our hope that the patient will enjoy some return of renal function in that a portion of his acute decline appears to have been related to ATN.  Nephrology continues to follow.  Oncology continues to follow.  HPI/Subjective: The patient is afebrile, denies CP, SOB, nausea, vomiting and abd pain. Seen during HD. No major distress.  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 -Per nephrology this likely represents end-stage renal disease as there has not been renal recovery. 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 - Oncology has initiated Velcade and Decadron -will follow oncology rec's -patient tolerating treatment   Hyperkalemia  -Due to acute renal failure  -will monitor electrolytes; currently addressed with HD  Acute hypoxic respiratory failure - pulmonary edema - resolved -pulmonary edema related to acute renal failure -resolved with dialysis -will follow daily weights and intake/output -patient advised to follow low sodium diet  Anemia -Due to kidney failure + MM - s/p 2U transfusion in HD 7/2  -follow Hgb trend, stable and not requiring transfusion currently  -Patient having a hemoglobin 9.2 on 02/13/2015  Hypertension -Blood pressure well controlled at present time; will continue current antihypertensive regimen  Prior history of DVT during hospitalization -Unable to use anticoagulation of any type for 2 weeks post renal biopsy -will continue TED-Hoses  COPD -No evidence of acute exacerbation at present time  Chronic arthritis -Denies focal joint pain at this time  Code Status: FULL Family Communication:  Disposition Plan: remains inpatient. Continue HD therapy and initiate clipping process  Consultants: Nephrology  Hematology/Oncology Vasc Surgery   Procedures: 6/22 insertion L IJ temporary hemodialysis catheter 6/22 HD initiated  6/24 kidney bx 6/25 plasmapheresis initiated 6/27 BMbx 6/29 R IJ tunneled HD cath + L  radiocephalic AV fistula   Antibiotics: None   DVT prophylaxis: SCDs (post renal  bx)  Objective: Blood pressure 132/52, pulse 68, temperature 98.7 F (37.1 C), temperature source Oral, resp. rate 19, height $RemoveBe'5\' 6"'WhYFBAXBZ$  (1.676 m), weight 89.7 kg (197 lb 12 oz), SpO2 92 %.  Intake/Output Summary (Last 24 hours) at 02/14/15 1834 Last data filed at 02/14/15 1705  Gross per 24 hour  Intake    600 ml  Output    425 ml  Net    175 ml    Exam: General: No acute respiratory distress, afebrile, no CP. Tolerating HD w/o problems Lungs: Clear to auscultation throughout without wheezes or focal crackles Cardiovascular: Regular rate & rhythm, no rubs or gallops Abdomen: Nontender, mildly protuberant, soft, bowel sounds positive, no rebound, no ascites, no mass Extremities: No significant cyanosis, clubbing; 1+ pedal edema bilateral lower extremities while sitting up in chair with feet dangling  Data Reviewed: Basic Metabolic Panel:  Recent Labs Lab 02/10/15 0315 02/11/15 0305 02/11/15 1020 02/12/15 0540 02/13/15 0834  NA 137 139 138 135 138  K 4.6 3.9 3.7 4.5 4.0  CL 91* 98* 97* 96* 96*  CO2 33* $Remov'30 28 24 26  'nIxSBC$ GLUCOSE 196* 123* 105* 78 92  BUN 38* 29* 36* 54* 68*  CREATININE 7.80* 6.08* 6.73* 8.63* 10.83*  CALCIUM 8.4* 8.0* 8.1* 7.5* 7.8*  PHOS 8.7* 5.9* 6.4* 7.2* 8.1*    CBC:  Recent Labs Lab 02/10/15 0315 02/11/15 0305 02/11/15 1020 02/12/15 0540 02/13/15 0833  WBC 5.0 10.3 10.1 9.7 8.5  HGB 7.6* 9.1* 9.6* 9.3* 9.2*  HCT 21.8* 26.8* 28.1* 27.2* 27.1*  MCV 95.2 89.9 90.9 90.7 90.3  PLT 81* 82* 90* 106* 117*    Liver Function Tests:  Recent Labs Lab 02/10/15 0315 02/11/15 0305 02/11/15 1020 02/12/15 0540 02/13/15 0834  ALBUMIN 2.9* 2.8* 2.9* 2.8* 2.9*    Studies:   Recent x-ray studies have been reviewed in detail by the Attending Physician  Scheduled Meds:  Scheduled Meds: . acyclovir  200 mg Oral BID  . amLODipine  5 mg Oral Daily  . antiseptic oral rinse  7 mL Mouth Rinse BID  . atenolol  25 mg Oral QHS  . budesonide-formoterol  2 puff  Inhalation BID  . darbepoetin (ARANESP) injection - DIALYSIS  60 mcg Intravenous Q Tue-HD  . dexamethasone  40 mg Oral Weekly  . fluticasone  1 spray Each Nare Daily  . lanthanum  500 mg Oral TID WC    Time spent on care of this patient: 25 mins   Kelvin Cellar , MD  [phone]   LOS: 15 days

## 2015-02-14 NOTE — Progress Notes (Signed)
Patient ID: Justin Woods, male   DOB: May 02, 1931, 81 y.o.   MRN: [id number]  [facility] Progress Note    Assessment/ Plan:   1. Oliguric acute renal failure-dialysis dependent--now likely ESRD. With minimal urine output and no indications for renal recovery as yet. 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 Status post 7 plasmapheresis treatments for reduction of light chain burden to minimize cast nephropathy. Unfortunately, continues to have oliguria/anuria and no evident renal recovery with need for continued dialysis. Process initiated for outpatient dialysis unit placement. Next hemodialysis tomorrow. 2. Multiple myeloma: Started on chemotherapy with Velcade and Decadron. The patient and family worried about arrangements for outpatient chemotherapy upon discharge. 3. Anemia: Secondary to renal failure/multiple myeloma, on ESA 4. Secondary hyperparathyroidism: Started Fosrenol for phosphorus binding  Subjective:   Reports to be feeling fair and delays any SOB/CP   Objective:   BP 132/51 mmHg  Pulse 71  Temp(Src) 98 F (36.7 C) (Oral)  Resp 18  Ht $R'5\' 6"'hA$  (1.676 m)  Wt 90.5 kg (199 lb 8.3 oz)  BMI 32.22 kg/m2  SpO2 94%  Intake/Output Summary (Last 24 hours) at 02/14/15 1027 Last data filed at 02/14/15 0851  Gross per 24 hour  Intake    480 ml  Output   3225 ml  Net  -2745 ml   Weight change:   Physical Exam: FKC:LEXNTZGYFVC resting on HD BSW:HQPRF RRR, normal s1 and s2 Resp:CTA bilaterally, no rales/rhonchi FMB:WGYK, obese, NT, BS normal ZLD:JTTSV LE edema  Imaging: No results found.  Labs: BMET  Recent Labs Lab 02/09/15 0956 02/09/15 1015 02/10/15 0315 02/11/15 0305 02/11/15 1020 02/12/15 0540 02/13/15 0834  NA 139 142 137 139 138 135 138  K 3.8 3.8 4.6 3.9 3.7 4.5 4.0  CL 90* 94* 91* 98* 97* 96* 96*  CO2  --  35* 33*  $Re'30 28 24 26  'ahk$ GLUCOSE 113* 116* 196* 123* 105* 78 92  BUN 27* 26* 38* 29* 36* 54* 68*  CREATININE 6.50* 6.55* 7.80* 6.08* 6.73* 8.63* 10.83*  CALCIUM  --  7.7* 8.4* 8.0* 8.1* 7.5* 7.8*  PHOS  --  7.4* 8.7* 5.9* 6.4* 7.2* 8.1*   CBC  Recent Labs Lab 02/11/15 0305 02/11/15 1020 02/12/15 0540 02/13/15 0833  WBC 10.3 10.1 9.7 8.5  HGB 9.1* 9.6* 9.3* 9.2*  HCT 26.8* 28.1* 27.2* 27.1*  MCV 89.9 90.9 90.7 90.3  PLT 82* 90* 106* 117*    Medications:    . acyclovir  200 mg Oral BID  . amLODipine  5 mg Oral Daily  . antiseptic oral rinse  7 mL Mouth Rinse BID  . atenolol  25 mg Oral QHS  . budesonide-formoterol  2 puff Inhalation BID  . darbepoetin (ARANESP) injection - DIALYSIS  60 mcg Intravenous Q Tue-HD  . dexamethasone  40 mg Oral Weekly  . fluticasone  1 spray Each Nare Daily  . lanthanum  500 mg Oral TID WC   Elmarie Shiley, MD 02/14/2015, 10:27 AM

## 2015-02-15 DIAGNOSIS — J9601 Acute respiratory failure with hypoxia: Secondary | ICD-10-CM

## 2015-02-15 DIAGNOSIS — J810 Acute pulmonary edema: Secondary | ICD-10-CM

## 2015-02-15 LAB — CBC
HEMATOCRIT: 27 % — AB (ref 39.0–52.0)
Hemoglobin: 9.1 g/dL — ABNORMAL LOW (ref 13.0–17.0)
MCH: 31.2 pg (ref 26.0–34.0)
MCHC: 33.7 g/dL (ref 30.0–36.0)
MCV: 92.5 fL (ref 78.0–100.0)
Platelets: 121 10*3/uL — ABNORMAL LOW (ref 150–400)
RBC: 2.92 MIL/uL — ABNORMAL LOW (ref 4.22–5.81)
RDW: 16.2 % — ABNORMAL HIGH (ref 11.5–15.5)
WBC: 6.7 10*3/uL (ref 4.0–10.5)

## 2015-02-15 LAB — BASIC METABOLIC PANEL
Anion gap: 12 (ref 5–15)
BUN: 37 mg/dL — AB (ref 6–20)
CO2: 27 mmol/L (ref 22–32)
Calcium: 8.2 mg/dL — ABNORMAL LOW (ref 8.9–10.3)
Chloride: 99 mmol/L — ABNORMAL LOW (ref 101–111)
Creatinine, Ser: 9.23 mg/dL — ABNORMAL HIGH (ref 0.61–1.24)
GFR calc Af Amer: 5 mL/min — ABNORMAL LOW (ref >60)
GFR, EST NON AFRICAN AMERICAN: 5 mL/min — AB (ref >60)
GLUCOSE: 178 mg/dL — AB (ref 65–99)
Potassium: 3.3 mmol/L — ABNORMAL LOW (ref 3.5–5.1)
Sodium: 138 mmol/L (ref 135–145)

## 2015-02-15 MED ORDER — HEPARIN SODIUM (PORCINE) 1000 UNIT/ML DIALYSIS: 40.0000 [IU]/kg | INTRAMUSCULAR | Status: DC | PRN

## 2015-02-15 MED ORDER — ALUM & MAG HYDROXIDE-SIMETH 200-200-20 MG/5ML PO SUSP: 15.0000 mL | Freq: Four times a day (QID) | ORAL | Status: DC | PRN

## 2015-02-15 NOTE — Progress Notes (Signed)
Patient ID: Justin Woods, male   DOB: 08-Apr-1931, 81 y.o.   MRN: [id number]  [facility] Progress Note    Assessment/ Plan:   1. Oliguric acute renal failure-dialysis dependent--now ESRD. With minimal urine output and no indications for renal recovery as yet. Renal biopsy showed a combination of ATN and myeloma cast nephropathy so there is small optimism for possible recovery in the future however timeline it remains unclear. Status post 7 plasmapheresis treatments for reduction of light chain burden to minimize cast nephropathy. Unfortunately, continues to have oliguria/anuria and no evident renal recovery with need for continued dialysis. Process initiated for outpatient dialysis unit placement.  2. Multiple myeloma: Started on chemotherapy with Velcade and Decadron. Hematology/oncology to coordinate outpatient chemotherapy 3. Anemia: Secondary to renal failure/multiple myeloma, on ESA 4. Secondary hyperparathyroidism: Started Fosrenol for phosphorus binding  Subjective:   Reports to be feeling fair and delays any SOB/CP   Objective:   BP 148/69 mmHg  Pulse 74  Temp(Src) 98.3 F (36.8 C) (Oral)  Resp 18  Ht _0  (1.676 m)  Wt 89.7 kg (197 lb 12 oz)  BMI 31.93 kg/m2  SpO2 93%  Intake/Output Summary (Last 24 hours) at 02/15/15 1040 Last data filed at 02/14/15 1845  Gross per 24 hour  Intake    600 ml  Output    200 ml  Net    400 ml   Weight change: -4.2 kg (-9 lb 4.2 oz)  Physical Exam: GOT:LXBWIOMBTDH resting on HD RCB:ULAGT RRR, normal s1 and s2 Resp:CTA bilaterally, no rales/rhonchi XMI:WOEH, obese, NT, BS normal OZY:YQMGN LE edema  Imaging: No results found.  Labs: BMET  Recent Labs Lab 02/09/15 1015 02/10/15 0315 02/11/15 0305 02/11/15 1020 02/12/15 0540 02/13/15 0834 02/15/15 0822  NA 142 137 139 138 135 138 138  K 3.8 4.6 3.9 3.7 4.5 4.0 3.3*  CL 94* 91* 98* 97* 96* 96* 99*  CO2 35* 33* _1 GLUCOSE 116* 196* 123* 105*  78 92 178*  BUN 26* 38* 29* 36* 54* 68* 37*  CREATININE 6.55* 7.80* 6.08* 6.73* 8.63* 10.83* 9.23*  CALCIUM 7.7* 8.4* 8.0* 8.1* 7.5* 7.8* 8.2*  PHOS 7.4* 8.7* 5.9* 6.4* 7.2* 8.1*  --    CBC  Recent Labs Lab 02/11/15 1020 02/12/15 0540 02/13/15 0833 02/15/15 0822  WBC 10.1 9.7 8.5 6.7  HGB 9.6* 9.3* 9.2* 9.1*  HCT 28.1* 27.2* 27.1* 27.0*  MCV 90.9 90.7 90.3 92.5  PLT 90* 106* 117* 121*    Medications:    . acyclovir  200 mg Oral BID  . amLODipine  5 mg Oral Daily  . antiseptic oral rinse  7 mL Mouth Rinse BID  . atenolol  25 mg Oral QHS  . budesonide-formoterol  2 puff Inhalation BID  . darbepoetin (ARANESP) injection - DIALYSIS  60 mcg Intravenous Q Tue-HD  . dexamethasone  40 mg Oral Weekly  . fluticasone  1 spray Each Nare Daily  . lanthanum  500 mg Oral TID WC   Elmarie Shiley, MD 02/15/2015, 10:40 AM

## 2015-02-15 NOTE — Procedures (Signed)
Patient seen on Hemodialysis. QB 320, UF goal 3L Treatment adjusted as needed.  Elmarie Shiley MD [facility]. Office # [phone] Pager # [phone] 10:50 AM

## 2015-02-15 NOTE — Progress Notes (Signed)
Progress Note  Justin Woods LFW:[id number] DOB: [birth year] DOA: 01/30/2015 PCP: Georgann Housekeeper, MD  Admit HPI / Brief Narrative: 81yo male with history of HTN and renal stones who went to see his PCP c/o fatigue with decreased urine output.  Labs noted creatinine of 12.6 w/ baseline at PCP office in October 2015 of 1.17.  CT abdomen and pelvis did not show evidence of renal stones or hydronephrosis, only mild pre-ureteral stranding.  Patient was seen by Urology Dr. Marlou Porch and had Foley catheter inserted with minimal urine output (total 150 mL).  Hospital Course: Despite ongoing care and assistance from Nephrology the patient's renal function failed to improve.  A renal biopsy was obtained.  Workup included an SPEP which revealed elevated kappa free light chains.  Oncology was consulted and a bone marrow biopsy was performed which confirmed the diagnosis of multiple myeloma.  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  The patient was treated with plasmapheresis 7 consecutive days.  Hemodialysis access was also accomplished (short-term as well as long-term) and hemodialysis was initiated.  Oncology initiated Velcade and Decadron therapy during the hospital stay.  The plan at this time is to transfer the patient to the renal floor and to continue hemodialysis on a Tuesday Thursday Saturday schedule.  It is our hope that the patient will enjoy some return of renal function in that a portion of his acute decline appears to have been related to ATN.  Nephrology continues to follow.  Oncology continues to follow.  HPI/Subjective: The patient states feeling about the same, tolerating PO intake. Denies fevers, chills, N/V. 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 -Per nephrology this likely represents end-stage renal disease as there has not been renal recovery. 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 Will keep patient overnight.   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 - Oncology has initiated Velcade and Decadron -will follow oncology rec's -patient tolerating treatment   Hyperkalemia  -Due to acute renal failure   Acute hypoxic respiratory failure - pulmonary edema - resolved -pulmonary edema related to acute renal failure -resolved with dialysis  Anemia -Due to kidney failure + MM - s/p 2U transfusion in HD 7/2   Hypertension -Blood pressure well controlled at present time; will continue current antihypertensive regimen  Prior history of DVT during hospitalization -Unable to use anticoagulation of any type for 2 weeks post renal biopsy -will continue TED-Hoses  COPD -No evidence of acute exacerbation at present time  Chronic arthritis -Denies focal joint pain at this time  Code Status: FULL Family Communication:  Disposition Plan: remains inpatient. Continue HD therapy and initiate clipping process  Consultants: Nephrology  Hematology/Oncology Vasc Surgery   Procedures: 6/22 insertion L IJ temporary hemodialysis catheter 6/22 HD initiated  6/24 kidney bx 6/25 plasmapheresis initiated 6/27 BMbx 6/29 R IJ tunneled HD cath + L radiocephalic AV fistula   Antibiotics: None   DVT prophylaxis: SCDs (post renal bx)  Objective: Blood  pressure 143/53, pulse 70, temperature 97.9 F (36.6 C), temperature source Oral, resp. rate 18, height $RemoveBe'5\' 6"'xRpuIwsca$  (1.676 m), weight 89.7 kg (197 lb 12  oz), SpO2 93 %.  Intake/Output Summary (Last 24 hours) at 02/15/15 1613 Last data filed at 02/15/15 1424  Gross per 24 hour  Intake    720 ml  Output      0 ml  Net    720 ml    Exam: General: No acute respiratory distress, afebrile, no CP. Tolerating HD w/o problems Lungs: Clear to auscultation throughout without wheezes or focal crackles Cardiovascular: Regular rate & rhythm, no rubs or gallops Abdomen: Nontender, mildly protuberant, soft, bowel sounds positive, no rebound, no ascites, no mass Extremities: No significant cyanosis, clubbing; 1+ pedal edema bilateral lower extremities while sitting up in chair with feet dangling  Data Reviewed: Basic Metabolic Panel:  Recent Labs Lab 02/10/15 0315 02/11/15 0305 02/11/15 1020 02/12/15 0540 02/13/15 0834 02/15/15 0822  NA 137 139 138 135 138 138  K 4.6 3.9 3.7 4.5 4.0 3.3*  CL 91* 98* 97* 96* 96* 99*  CO2 33* $Remov'30 28 24 26 27  'NfAZaG$ GLUCOSE 196* 123* 105* 78 92 178*  BUN 38* 29* 36* 54* 68* 37*  CREATININE 7.80* 6.08* 6.73* 8.63* 10.83* 9.23*  CALCIUM 8.4* 8.0* 8.1* 7.5* 7.8* 8.2*  PHOS 8.7* 5.9* 6.4* 7.2* 8.1*  --     CBC:  Recent Labs Lab 02/11/15 0305 02/11/15 1020 02/12/15 0540 02/13/15 0833 02/15/15 0822  WBC 10.3 10.1 9.7 8.5 6.7  HGB 9.1* 9.6* 9.3* 9.2* 9.1*  HCT 26.8* 28.1* 27.2* 27.1* 27.0*  MCV 89.9 90.9 90.7 90.3 92.5  PLT 82* 90* 106* 117* 121*    Liver Function Tests:  Recent Labs Lab 02/10/15 0315 02/11/15 0305 02/11/15 1020 02/12/15 0540 02/13/15 0834  ALBUMIN 2.9* 2.8* 2.9* 2.8* 2.9*    Studies:   Recent x-ray studies have been reviewed in detail by the Attending Physician  Scheduled Meds:  Scheduled Meds: . acyclovir  200 mg Oral BID  . amLODipine  5 mg Oral Daily  . antiseptic oral rinse  7 mL Mouth Rinse BID  . atenolol  25 mg Oral QHS  . budesonide-formoterol  2 puff Inhalation BID  . darbepoetin (ARANESP) injection - DIALYSIS  60 mcg Intravenous Q Tue-HD  . dexamethasone  40 mg  Oral Weekly  . fluticasone  1 spray Each Nare Daily  . lanthanum  500 mg Oral TID WC    Time spent on care of this patient: 20 mins   Kelvin Cellar , MD  [phone]   LOS: 16 days

## 2015-02-16 ENCOUNTER — Encounter (HOSPITAL_COMMUNITY): Payer: Self-pay

## 2015-02-16 ENCOUNTER — Other Ambulatory Visit: Payer: Self-pay | Admitting: *Deleted

## 2015-02-16 DIAGNOSIS — I10 Essential (primary) hypertension: Secondary | ICD-10-CM

## 2015-02-16 LAB — CBC
HEMATOCRIT: 27.2 % — AB (ref 39.0–52.0)
HEMOGLOBIN: 9.1 g/dL — AB (ref 13.0–17.0)
MCH: 31.6 pg (ref 26.0–34.0)
MCHC: 33.5 g/dL (ref 30.0–36.0)
MCV: 94.4 fL (ref 78.0–100.0)
Platelets: 122 10*3/uL — ABNORMAL LOW (ref 150–400)
RBC: 2.88 MIL/uL — ABNORMAL LOW (ref 4.22–5.81)
RDW: 16.4 % — ABNORMAL HIGH (ref 11.5–15.5)
WBC: 6.8 10*3/uL (ref 4.0–10.5)

## 2015-02-16 LAB — RENAL FUNCTION PANEL
Albumin: 3.1 g/dL — ABNORMAL LOW (ref 3.5–5.0)
Anion gap: 11 (ref 5–15)
BUN: 27 mg/dL — AB (ref 6–20)
CALCIUM: 8.6 mg/dL — AB (ref 8.9–10.3)
CO2: 28 mmol/L (ref 22–32)
CREATININE: 8.28 mg/dL — AB (ref 0.61–1.24)
Chloride: 101 mmol/L (ref 101–111)
GFR calc Af Amer: 6 mL/min — ABNORMAL LOW (ref >60)
GFR calc non Af Amer: 5 mL/min — ABNORMAL LOW (ref >60)
GLUCOSE: 124 mg/dL — AB (ref 65–99)
PHOSPHORUS: 4.5 mg/dL (ref 2.5–4.6)
Potassium: 3.3 mmol/L — ABNORMAL LOW (ref 3.5–5.1)
Sodium: 140 mmol/L (ref 135–145)

## 2015-02-16 LAB — BASIC METABOLIC PANEL
ANION GAP: 10 (ref 5–15)
BUN: 26 mg/dL — ABNORMAL HIGH (ref 6–20)
CHLORIDE: 101 mmol/L (ref 101–111)
CO2: 27 mmol/L (ref 22–32)
Calcium: 8.3 mg/dL — ABNORMAL LOW (ref 8.9–10.3)
Creatinine, Ser: 8 mg/dL — ABNORMAL HIGH (ref 0.61–1.24)
GFR calc non Af Amer: 5 mL/min — ABNORMAL LOW (ref >60)
GFR, EST AFRICAN AMERICAN: 6 mL/min — AB (ref >60)
Glucose, Bld: 90 mg/dL (ref 65–99)
Potassium: 3.8 mmol/L (ref 3.5–5.1)
SODIUM: 138 mmol/L (ref 135–145)

## 2015-02-16 LAB — TISSUE HYBRIDIZATION (BONE MARROW)-NCBH

## 2015-02-16 LAB — CHROMOSOME ANALYSIS, BONE MARROW

## 2015-02-16 MED ORDER — OXYCODONE HCL 5 MG PO TABS: 5.0000 mg | ORAL_TABLET | Freq: Four times a day (QID) | ORAL | Status: DC | PRN

## 2015-02-16 MED ORDER — ONDANSETRON HCL 8 MG PO TABS
8.0000 mg | ORAL_TABLET | Freq: Once | ORAL | Status: AC
  Administered 2015-02-16: 8 mg via ORAL
  Filled 2015-02-16: qty 1

## 2015-02-16 MED ORDER — LANTHANUM CARBONATE 500 MG PO CHEW: 500.0000 mg | CHEWABLE_TABLET | Freq: Three times a day (TID) | ORAL | Status: DC

## 2015-02-16 MED ORDER — METHOCARBAMOL 500 MG PO TABS: 500.0000 mg | ORAL_TABLET | Freq: Three times a day (TID) | ORAL | Status: DC | PRN

## 2015-02-16 MED ORDER — BORTEZOMIB CHEMO SQ INJECTION 3.5 MG (2.5MG/ML)
1.3000 mg/m2 | Freq: Once | INTRAMUSCULAR | Status: AC
  Administered 2015-02-16: 2.5 mg via SUBCUTANEOUS
  Filled 2015-02-16: qty 1

## 2015-02-16 NOTE — Care Management Note (Signed)
Case Management Note  Patient Details  Name: Justin Woods MRN: [id number] Date of Birth: 09-12-1930  Subjective/Objective:       CM following for progression and d/c planning             Action/Plan: 02/16/15 Met with pt and wife re HHPT , after discussion pt and wife feel that HHPT is not needed as pt is ambulating indep, this CM noted that PT eval recommended however was no longer working with pt. This pt is not home bound and will be active with trips to HD and cancer center. Pt declined offer of rolling walker to use if needed stating that they would be able to borrow one from the church.  Expected Discharge Date:   (UNKNOWN)               Expected Discharge Plan:  Home/Self Care  In-House Referral:     Discharge planning Services  CM Consult  Post Acute Care Choice:  NA Choice offered to:  NA  DME Arranged:    DME Agency:     HH Arranged:    HH Agency:  [facility]  Status of Service:  Completed, signed off  Medicare Important Message Given:  Yes-fourth notification given Date Medicare IM Given:  02/02/15 Medicare IM give by:  Babette Relic RN Date Additional Medicare IM Given:    Additional Medicare Important Message give by:     If discussed at [place] of Stay Meetings, dates discussed:    Additional Comments:  Adron Bene, RN 02/16/2015, 3:26 PM

## 2015-02-16 NOTE — Discharge Summary (Signed)
Physician Discharge Summary  Justin Woods ZOX:[id number] DOB: 1931-05-28 DOA: 01/30/2015  PCP: Wenda Low, MD  Admit date: 01/30/2015 Discharge date: 02/16/2015  Time spent: 35 minutes  Recommendations for Outpatient Follow-up:  1. Please follow up in BMP and CBC on hospital follow up 2. He was diagnosed with Multiple Myeloma started on chemotherapy during this hospitalization, will need close follow up on the [place] for continuation of treatment 3. Prior to discharge he was set up with Home Health PT  Discharge Diagnoses:  Active Problems:   Acute renal failure   Hypertension   Renal failure   Dyspnea   End-stage renal disease on hemodialysis   Hyperkalemia   Acute respiratory failure with hypoxia   Acute pulmonary edema   Essential hypertension   Other emphysema   Multiple myeloma   Pulmonary hypertension   DVT (deep venous thrombosis)   Discharge Condition: Stable  Diet recommendation: Renal Diet  Filed Weights   02/14/15 1704 02/15/15 1208 02/15/15 2137  Weight: 89.7 kg (197 lb 12 oz) 87.6 kg (193 lb 2 oz) 87.816 kg (193 lb 9.6 oz)    History of present illness:  Justin Woods is a 81 y.o. male, with past medical history of hypertension, renal stone, patient went to see his PCP today as he was not feeling well last 2 days, fatigued, with decreased urine output, labs were showing creatinine of 12.6, most recent lab at PCP office showing creatinine in October 2015 of 1.17 , patient denies any dysuria, polyuria, reports decreased urine output over the last 2 days, denies fever, chills, rash, flank pain, CT abdomen and pelvis does not show evidence of renal stones or hydronephrosis, only mild pre-ureteral stranding, patient was seen by urology Dr.Herrick, where he had Foley catheter inserted with minimal urine output(currently 150 mL in leg bag), hospitalist requested to admit the patient for further workup.  Hospital Course:  Despite ongoing care and assistance  from Nephrology the patient's renal function failed to improve. A renal biopsy was obtained. Workup included an SPEP which revealed elevated kappa free light chains. Oncology was consulted and a bone marrow biopsy was performed which confirmed the diagnosis of multiple myeloma. 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 The patient was treated with plasmapheresis 7 consecutive days. Hemodialysis access was also accomplished (short-term as well as long-term) and hemodialysis was initiated. Oncology initiated Velcade and Decadron therapy during the hospital stay. He was placed on hemodialysis on a Tuesday Thursday Saturday schedule.   Acute renal failure -Baseline creatinine 1.27 May 2014 - renal biopsy confirmed myeloma cast nephropathy with some ATN - Nephrology directing HD  - SPEP noted elevated kappa free light chains prompting Heme consult - has completed a seven-day course of plasmapheresis -Per nephrology this likely represents end-stage renal disease as there has not been renal recovery. 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 - Oncology has initiated Velcade and Decadron -patient tolerating treatment   Hyperkalemia  -Due to acute renal failure  -Resolved  Acute hypoxic respiratory failure - pulmonary edema - resolved -pulmonary edema related to acute renal failure -resolved with dialysis  Anemia -Due to kidney failure + MM - s/p 2U transfusion in HD 7/2   Hypertension -Blood pressure well controlled at present time; will continue current antihypertensive regimen  Prior history of DVT during hospitalization -Unable  to use anticoagulation of any type for 2 weeks post renal biopsy -will continue TED-Hoses  Acute renal failure -Baseline creatinine 1.27 May 2014 - 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 -Per nephrology this likely represents end-stage renal disease as there has not been renal recovery. 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 Will keep patient overnight.   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 -(grade 1 diastolicdysfunction). - Pulmonary arteries: PA peak pressure: 39 mm Hg (S). 6/24 kidney biopsy pending 6/27 bone marrow biopsy; atypical plasma cells representing 54% of all cells (kappa light chain restricted consistent with plasma cell neoplasm) 6/29 R IJ tunneled HD cath + L radiocephalic AV fistula   Consultations: Nephrology  Hematology/Oncology Vasc Surgery   Discharge Exam: Filed Vitals:   02/16/15 0803  BP: 135/56  Pulse: 75  Temp: 99.6 F (37.6 C)  Resp: 18    General: No acute respiratory distress, afebrile, no CP. Tolerating HD w/o problems Lungs: Clear to auscultation throughout without wheezes or focal crackles Cardiovascular: Regular rate & rhythm, no rubs or  gallops Abdomen: Nontender, mildly protuberant, soft, bowel sounds positive, no rebound, no ascites, no mass Extremities: No significant cyanosis, clubbing; 1+ pedal edema bilateral lower extremities while sitting up in chair with feet dangling  Discharge Instructions   Discharge Instructions    AMB Referral to [facility]    Complete by:  As directed   Confirmed that [facility] will follow initially for post transition of care. Patient will need [facility] RNCM follow up for monthly home visits due to multiple co-morbidities. Consents obtained. Thanks. Marthenia Rolling, MSN-Ed, [facility] [phone]  Reason for consult:  Will need [facility] RNCM assignment after [facility] transition of care.  Expected date of contact:  1-3 days (reserved for hospital discharges)     Call MD for:  difficulty breathing, headache or visual disturbances    Complete by:  As directed      Call MD for:  extreme fatigue    Complete by:  As directed      Call MD for:  hives    Complete by:  As directed      Call MD for:  persistant dizziness or light-headedness    Complete by:  As directed      Call MD for:  persistant nausea and vomiting    Complete by:  As directed      Call MD for:  redness, tenderness, or signs of infection (pain, swelling, redness, odor or green/yellow discharge around incision site)    Complete by:  As directed      Call MD for:  severe uncontrolled pain    Complete by:  As directed      Call MD for:  temperature >100.4    Complete by:  As directed      Call MD for:    Complete by:  As directed      Diet - low sodium heart healthy    Complete by:  As directed      Increase activity slowly    Complete by:  As directed      [place]    Complete by:  As directed   Chemotherapy Appointment  - 1 hour      SCHEDULING COMMUNICATION    Complete by:  As directed   Chemotherapy Appointment  - 1 hour      TREATMENT CONDITIONS    Complete by:  As  directed   Patient should have CBC & CMP within 7 days prior to chemotherapy administration. NOTIFY MD IF: ANC < 1500, Hemoglobin < 8, PLT < 100,000,  Total Bili > 1.5, Creatinine > 1.5, ALT & AST > 80 or if patient has unstable vital signs: Temperature > 38.5, SBP > 180 or < 90, RR > 30 or HR > 100.     TREATMENT CONDITIONS    Complete by:  As directed   Patient should have CBC & CMP within 7 days prior to chemotherapy administration. NOTIFY MD IF: ANC < 1500, Hemoglobin < 8, PLT < 100,000,  Total Bili > 1.5, Creatinine > 1.5, ALT & AST > 80 or if patient has unstable vital signs: Temperature > 38.5, SBP > 180 or < 90, RR > 30 or HR > 100.          Current Discharge Medication List    START taking these medications   Details  lanthanum (FOSRENOL) 500 MG chewable tablet Chew 1 tablet (500 mg total) by mouth 3 (three) times daily with meals. Qty: 90 tablet, Refills: 0    methocarbamol (ROBAXIN) 500 MG tablet Take 1 tablet (500 mg total) by mouth every 8 (eight) hours as needed for muscle spasms. Qty: 20 tablet, Refills: 1    oxyCODONE (OXY IR/ROXICODONE) 5 MG immediate release tablet Take 1 tablet (5 mg total) by mouth every 6 (six) hours as needed for moderate pain. Qty: 10 tablet, Refills: 0      CONTINUE these medications which have NOT CHANGED   Details  amLODipine (NORVASC) 5 MG tablet Take 5 mg by mouth daily.    atenolol (TENORMIN) 50 MG tablet Take 25 mg by mouth daily.    budesonide-formoterol (SYMBICORT) 80-4.5 MCG/ACT inhaler Inhale 2 puffs into the lungs 2 (two) times daily.    Cholecalciferol (VITAMIN D PO) Take 1 tablet by mouth daily.    fluticasone (FLONASE) 50 MCG/ACT nasal spray Place 1 spray into both nostrils daily.    omeprazole (PRILOSEC) 40 MG capsule Take 40 mg by mouth daily.      STOP taking these medications     hydrochlorothiazide (HYDRODIURIL) 25 MG tablet        No Known Allergies Follow-up Information    Follow up with Curt Jews, MD In 4  weeks.   Specialties:  Vascular Surgery, Cardiology   Why:  sent message to office   Contact information:   [place] [address] [phone]       Follow up with Eilleen Kempf., MD In 1 week.   Specialty:  Oncology   Contact information:   [address] [place]  [address] [phone]       Follow up with Ulla Potash., MD In 1 week.   Specialty:  Nephrology   Contact information:   [address] [place] [address] [phone]       Follow up with Wenda Low, MD In 1 week.   Specialty:  Internal Medicine   Contact information:   [address] [place] [address] [phone]        The results of significant diagnostics from this hospitalization (including imaging, microbiology, ancillary and laboratory) are listed below for reference.    Significant Diagnostic Studies: Ct Abdomen Pelvis Wo Contrast  01/30/2015   CLINICAL DATA:  81 year old male with abdominal distention, renal failure, right flank pain, oliguria. Initial encounter.  EXAM: CT ABDOMEN AND PELVIS WITHOUT CONTRAST  TECHNIQUE: Multidetector CT imaging of the abdomen and pelvis was performed following the standard protocol without IV contrast.  COMPARISON:  CT Abdomen and Pelvis 05/29/2014 and earlier.  FINDINGS: New small layering right pleural effusion. Trace layering left pleural effusion. No pericardial effusion. Septal thickening at both lung bases. Mild dependent atelectasis.  Stable visualized osseous structures. L4 hemivertebra type appearance re- identified.  Stable fat containing left inguinal hernia. No pelvic free fluid. Foley catheter in the bladder which is decompressed. Decompressed distal colon.  Sigmoid diverticulosis with no active inflammation. Occasional diverticula in the more proximal colon without active inflammation. Fluid in the ascending colon. Negative appendix and terminal ileum. No dilated small bowel. Oral contrast in the stomach and proximal  small bowel, has not yet reached the distal small bowel. Mildly distended stomach.  No pneumoperitoneum. No free fluid in the abdomen. Noncontrast liver remarkable for decreased density in keeping with steatosis. Surgically absent gallbladder. Negative non contrast spleen, pancreas and adrenal glands. Aortoiliac calcified atherosclerosis noted. Mild ectasia of the infrarenal aorta is stable.  Negative non contrast right kidney. No right nephrolithiasis, hydronephrosis, or perinephric stranding. Stable left kidney with midpole cyst. No left hydronephrosis or renal calculus.  No hydroureter, but there is mild bilateral periureteral stranding (series 2, images 47-49). This does not continue all the way to the ureterovesical junction. The appearance is nonspecific. There is no lymphadenopathy in the abdomen or pelvis.  IMPRESSION: 1. New small layering pleural effusions and pulmonary septal thickening at the lung bases compatible with a degree of interstitial edema. 2. No obstructive uropathy or urologic calculus. Mild nonspecific bilateral periureteral stranding. Query ascending urinary tract infection. Bladder decompressed by Foley catheter. 3. Otherwise no acute or inflammatory changes in the abdomen or pelvis. Hepatic steatosis. Diverticulosis of the colon.   Electronically Signed   By: Genevie Ann M.D.   On: 01/30/2015 15:43   Dg Chest 2 View  01/30/2015   CLINICAL DATA:  Fatigue.  Shortness of breath.  EXAM: CHEST  2 VIEW  COMPARISON:  11/08/2014.  FINDINGS: Mediastinum and hilar structures are normal. No focal infiltrate. Cardiomegaly with mild pulmonary vascular prominence. Mild interstitial prominence. Small right pleural effusion. No acute bony abnormality.  IMPRESSION: 1. Cardiomegaly with mild pulmonary vascular prominence and interstitial prominence. Mild congestive heart failure cannot be excluded . 2. Small right pleural effusion .   Electronically Signed   By: Marcello Moores  Register   On: 01/30/2015 11:26    Dg Abd 1 View  01/30/2015   CLINICAL DATA:  Abdominal distention.  EXAM: ABDOMEN - 1 VIEW  COMPARISON:  None.  FINDINGS: Cholecystectomy. Soft tissue structures are unremarkable. Nondilated air-filled loops of small bowel are noted. Colonic gas  pattern is unremarkable. No free air. Pelvic calcifications consistent phleboliths. Degenerative changes and scoliosis lumbar spine. Degenerative changes both hips.  IMPRESSION: Multiple air-filled loops of nondilated small bowel. Follow-up abdominal series is suggested to exclude developing small bowel distention.   Electronically Signed   By: Marcello Moores  Register   On: 01/30/2015 11:28   US Biopsy  02/02/2015   INDICATION: Acute renal failure of uncertain etiology. Please perform ultrasound-guided random renal biopsy for tissue diagnostic purposes  EXAM: ULTRASOUND GUIDED RENAL BIOPSY  COMPARISON:  CT abdomen pelvis - 01/30/2015  MEDICATIONS: Fentanyl 25 mcg IV; Versed 1 mg IV  ANESTHESIA/SEDATION: Total Moderate Sedation time  15 minutes  COMPLICATIONS: None immediate  PROCEDURE: Informed written consent was obtained from the patient after a discussion of the risks, benefits and alternatives to treatment. The patient understands and consents the procedure. A timeout was performed prior to the initiation of the procedure.  Ultrasound scanning was performed of the bilateral flanks. The inferior pole of the left kidney was selected for biopsy due to location and sonographic window. The procedure was planned. The operative site was prepped and draped in the usual sterile fashion. The overlying soft tissues were anesthetized with 1% lidocaine with epinephrine. A 17 gauge core needle biopsy device was advanced into the inferior cortex of the left kidney and 2 core biopsies were obtained under direct ultrasound guidance. Real time pathologic review confirmed adequate tissue acquisition. Images were saved for documentation purposes. The biopsy device was removed and hemostasis  was obtained with manual compression. Post procedural scanning was negative for significant post procedural hemorrhage or additional complication. A dressing was placed. The patient tolerated the procedure well without immediate post procedural complication.  IMPRESSION: Technically successful ultrasound guided left renal biopsy.   Electronically Signed   By: Sandi Mariscal M.D.   On: 02/02/2015 11:46   Ct Biopsy  02/05/2015   CLINICAL DATA:  81 year old male with a history of myeloma. He has been referred for CT-guided bone marrow biopsy  EXAM: CT-GUIDED BIOPSY of bone marrow  MEDICATIONS AND MEDICAL HISTORY: Versed 1.0 mg, Fentanyl 50 mcg.  Additional Medications: None.  ANESTHESIA/SEDATION: Moderate sedation time: 10 minutes  PROCEDURE: The procedure risks, benefits, and alternatives were explained to the patient. Questions regarding the procedure were encouraged and answered. The patient understands and consents to the procedure.  Scout CT of the pelvis was performed for surgical planning purposes.  The posterior pelvis was prepped with Betadinein a sterile fashion, and a sterile drape was applied covering the operative field. A sterile gown and sterile gloves were used for the procedure. Local anesthesia was provided with 1% Lidocaine.  We targeted the left posterior iliac bone for biopsy. The skin and subcutaneous tissues were infiltrated with 1% lidocaine without epinephrine. A small stab incision was made with an 11 blade scalpel, and an 11 gauge Murphy needle was advanced with CT guidance to the posterior cortex. Manual forced was used to advance the needle through the posterior cortex and the stylet was removed. A bone marrow aspirate was retrieved and passed to a cytotechnologist in the room. The Murphy needle was then advanced without the stylet for a core biopsy. The core biopsy was retrieved and also passed to a cytotechnologist.  Manual pressure was used for hemostasis and a sterile dressing was  placed.  No complications were encountered no significant blood loss was encountered.  Patient tolerated the procedure well and remained hemodynamically stable throughout.  Marland Kitchen  FINDINGS: Scout image demonstrates safe approach  to posterior iliac bone.  Images during the case demonstrate placement of 11 gauge Murphy needle  COMPLICATIONS: None  IMPRESSION: Status post CT-guided bone marrow biopsy, with tissue specimen sent to pathology for complete histopathologic analysis  Signed,  Dulcy Fanny. Earleen Newport, DO  [facility]  [facility]   Electronically Signed   By: Corrie Mckusick D.O.   On: 02/05/2015 10:27   Dg Chest Port 1 View  02/07/2015   CLINICAL DATA:  Central line placement portable exam 1215 hours compared to 01/31/2015  EXAM: PORTABLE CHEST - 1 VIEW  COMPARISON:  None.  FINDINGS: RIGHT jugular central venous catheter with tips projecting over RIGHT atrium.  Enlargement of cardiac silhouette.  Mediastinal contours and pulmonary vascularity normal.  Atherosclerotic calcification aorta.  Bibasilar atelectasis.  No gross infiltrate, pleural effusion or pneumothorax.  No acute osseous findings.  IMPRESSION: No pneumothorax following central line placement.  Bibasilar atelectasis.   Electronically Signed   By: Lavonia Dana M.D.   On: 02/07/2015 12:22   Dg Chest Port 1 View  01/31/2015   CLINICAL DATA:  Initial encounter for hemodialysis catheter placement.  EXAM: PORTABLE CHEST - 1 VIEW  COMPARISON:  01/30/2015.  FINDINGS: 1142 hrs. Left IJ dialysis catheter tip overlies the left hilum. The tip projects slightly more lateral than typical seen which is probably related to rightward patient rotation. There is pulmonary vascular congestion without overt pulmonary edema. No focal airspace consolidation or pleural effusion. Cardiopericardial silhouette is at upper limits of normal for size. Telemetry leads overlie the chest. No evidence for pneumothorax.  IMPRESSION: 1. No  evidence for pneumothorax status post central line placement.   Electronically Signed   By: Misty Stanley M.D.   On: 01/31/2015 12:02   Dg Chest Port 1 View  01/30/2015   CLINICAL DATA:  Dyspnea and high creatinine level  EXAM: PORTABLE CHEST - 1 VIEW  COMPARISON:  01/30/2015  FINDINGS: Pulmonary edema has developed since earlier today. No cardiomegaly when accounting for lower mediastinal fat. Negative aortic and hilar contours. Trace right pleural effusion.  IMPRESSION: Pulmonary edema, new from 11:30 a.m. earlier today.   Electronically Signed   By: Monte Fantasia M.D.   On: 01/30/2015 17:14   Dg Bone Survey Met  02/03/2015   CLINICAL DATA:  Multiple myeloma  EXAM: METASTATIC BONE SURVEY  COMPARISON:  Multiple exams, including 01/30/2015  FINDINGS: No punched out lucent cortical lesions characteristic of multiple myeloma identified.  Scattered degenerative findings. Aortoiliac atherosclerotic vascular disease. Left IJ line tip: SVC.  IMPRESSION: 1. No lucent lesions characteristic of multiple myeloma are identified.   Electronically Signed   By: Van Clines M.D.   On: 02/03/2015 21:30    Microbiology: No results found for this or any previous visit (from the past 240 hour(s)).   Labs: Basic Metabolic Panel:  Recent Labs Lab 02/10/15 0315 02/11/15 0305 02/11/15 1020 02/12/15 0540 02/13/15 0834 02/15/15 0822 02/16/15 0556  NA 137 139 138 135 138 138 138  K 4.6 3.9 3.7 4.5 4.0 3.3* 3.8  CL 91* 98* 97* 96* 96* 99* 101  CO2 33* _0 GLUCOSE 196* 123* 105* 78 92 178* 90  BUN 38* 29* 36* 54* 68* 37* 26*  CREATININE 7.80* 6.08* 6.73* 8.63* 10.83* 9.23* 8.00*  CALCIUM 8.4* 8.0* 8.1* 7.5* 7.8* 8.2* 8.3*  PHOS 8.7* 5.9* 6.4* 7.2* 8.1*  --   --    Liver Function Tests:  Recent Labs Lab  02/10/15 0315 02/11/15 0305 02/11/15 1020 02/12/15 0540 02/13/15 0834  ALBUMIN 2.9* 2.8* 2.9* 2.8* 2.9*   No results for input(s): LIPASE, AMYLASE in the last 168 hours. No  results for input(s): AMMONIA in the last 168 hours. CBC:  Recent Labs Lab 02/11/15 1020 02/12/15 0540 02/13/15 0833 02/15/15 0822 02/16/15 0556  WBC 10.1 9.7 8.5 6.7 6.8  HGB 9.6* 9.3* 9.2* 9.1* 9.1*  HCT 28.1* 27.2* 27.1* 27.0* 27.2*  MCV 90.9 90.7 90.3 92.5 94.4  PLT 90* 106* 117* 121* 122*   Cardiac Enzymes: No results for input(s): CKTOTAL, CKMB, CKMBINDEX, TROPONINI in the last 168 hours. BNP: BNP (last 3 results) No results for input(s): BNP in the last 8760 hours.  ProBNP (last 3 results) No results for input(s): PROBNP in the last 8760 hours.  CBG: No results for input(s): GLUCAP in the last 168 hours.     SignedKelvin Cellar  [facility] 02/16/2015, 9:36 AM

## 2015-02-16 NOTE — Procedures (Addendum)
Patient seen on Hemodialysis. QB 400, UF goal 1.5L Treatment adjusted as needed. I called Dr.Gudena to discuss his Velcade/decdron dosing  Elmarie Shiley MD [facility]. Office # [phone] Pager # [phone] 10:00 AM

## 2015-02-16 NOTE — Progress Notes (Signed)
Wife, Justin Woods, called after discharge.  The new prescription for Fosrenol is not covered by her insurance and the out of pocket cost is $1185.  Mary with [facility] was contacted and she consulted Dr. Posey Pronto with [name].  It is ok for the patient to take OTC Tums Chewable 500 mg TID with meals.  This information was conveyed to the patient's wife.  She understood and agreed with the plan.  [employer] RN-BC, WTA.

## 2015-02-19 ENCOUNTER — Other Ambulatory Visit: Payer: Self-pay | Admitting: *Deleted

## 2015-02-19 ENCOUNTER — Telehealth: Payer: Self-pay | Admitting: Internal Medicine

## 2015-02-19 NOTE — Patient Outreach (Signed)
Error entry.   Giovannina Mun M.   Patrici Minnis RN CCM [facility]  [phone]  

## 2015-02-19 NOTE — Telephone Encounter (Signed)
hosp f/u-s/w patient and gave appt for 07/13 @ 10:15 w/Dr. Julien Nordmann

## 2015-02-19 NOTE — Patient Outreach (Signed)
Received a return phone call from pt's spouse as this RN CM left a HIPPA compliant voice message earlier.    HIPPA verified on pt.   Spouse states pt is currently in dialysis.   RN CM discussed with spouse (on [facility] consent ) [facility] services which include a home visit from a nurse, benefit of his insurance, no cost to pt.   Spouse states pt is busy this week, next week (MD appointments, dialysis three days a week).  Spouse states pt is taking all of his medications, except one that was not covered by his insurance.  Spouse reports  pt has been taking Tums, was told by Dialysis  can provide TUMS at a more affordable price.  Spouse states she to call [facility] and get an appointment, rescheduled appointment with Dr. Julien Nordmann 7/13, has dialysis that day.  Spouse states she also has to call Dr. Lysle Rubens (Primary care MD).  Spouse states to call RN CM back tomorrow if possible.      RN CM to wait for spouse to call back, schedule home visit for pt.  If no response by next week, will call again.    Zara Chess.   [facility]  [phone]

## 2015-02-19 NOTE — Patient Outreach (Addendum)
Attempt made to contact pt, schedule home visit as pt was discharged 7/8.   This RN CM was informed by [facility] hospital RN liason that [facility] to do transition of care calls.  HIPPA compliant voice message left with contact number.   If no response, plan to call again.      Zara Chess.   [facility]  [phone]

## 2015-02-20 ENCOUNTER — Telehealth: Payer: Self-pay | Admitting: Internal Medicine

## 2015-02-20 NOTE — Telephone Encounter (Signed)
pt called to r/s appt...NP appt...transferred to [facility]

## 2015-02-21 ENCOUNTER — Ambulatory Visit: Payer: PPO

## 2015-02-21 ENCOUNTER — Ambulatory Visit (HOSPITAL_BASED_OUTPATIENT_CLINIC_OR_DEPARTMENT_OTHER): Payer: PPO | Admitting: Internal Medicine

## 2015-02-21 ENCOUNTER — Encounter: Payer: Self-pay | Admitting: Internal Medicine

## 2015-02-21 ENCOUNTER — Inpatient Hospital Stay: Payer: PPO | Admitting: Internal Medicine

## 2015-02-21 VITALS — BP 123/53 | HR 70 | Temp 98.5°F | Resp 18 | Ht 66.0 in | Wt 190.9 lb

## 2015-02-21 DIAGNOSIS — C9000 Multiple myeloma not having achieved remission: Secondary | ICD-10-CM

## 2015-02-21 DIAGNOSIS — Z992 Dependence on renal dialysis: Secondary | ICD-10-CM | POA: Diagnosis not present

## 2015-02-21 DIAGNOSIS — N179 Acute kidney failure, unspecified: Secondary | ICD-10-CM

## 2015-02-21 DIAGNOSIS — N186 End stage renal disease: Secondary | ICD-10-CM

## 2015-02-21 DIAGNOSIS — Z5111 Encounter for antineoplastic chemotherapy: Secondary | ICD-10-CM

## 2015-02-21 MED ORDER — ACYCLOVIR 400 MG PO TABS: 400.0000 mg | ORAL_TABLET | Freq: Two times a day (BID) | ORAL | Status: DC

## 2015-02-21 MED ORDER — ACYCLOVIR 400 MG PO TABS: 400.0000 mg | ORAL_TABLET | Freq: Two times a day (BID) | ORAL | Status: AC

## 2015-02-21 MED ORDER — DEXAMETHASONE 4 MG PO TABS: ORAL_TABLET | ORAL | Status: DC

## 2015-02-21 NOTE — Progress Notes (Signed)
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  CURRENT THERAPY: 1) hemodialysis on Monday, Wednesday and Friday weekly under the care of [facility]. 2) weekly subcutaneous Velcade 1.3 MG/M2 in addition to Decadron 40 mg by mouth weekly. Status post 2 cycles.  INTERVAL HISTORY: Justin Woods 81 y.o. male returns to the clinic today for hospital follow-up visit accompanied by his wife. The patient was seen for initial evaluation during his hospitalization at [facility] when he was diagnosed with acute renal failure. He underwent right renal biopsy that showed myeloma cast nephropathy, Immunophenotype with diffuse acute tubular injury superimposed on mild tubular interstitial scarring and moderate atherosclerosis. The patient also had CT-guided bone marrow biopsy and aspirate on 02/05/2015 and it showed hypercellular bone marrow with plasma cell neoplasm. The plasma cells represent 54% of all cells in the aspirate associated with prominent interstitial infiltrates and numerous variably sized clusters in the clot and biopsy sections. Immunoperoxidase stains show that the atypical plasma cells are kappa light chain restricted consistent with plasma cell neoplasm. Skeletal bone survey performed on 02/03/2015 showed no lucent lesions characteristic of multiple myeloma. Serum light chain during his hospitalization showed significant elevation of free kappa light chain to 1,202.40, free lambda light chain was 1.49 with a kappa lambda ratio of 804.28. Quantitative immunoglobulin showed normal IgG of 769, IgA 1:30 and IgM 65. The patient was started on  plasma phoresis for 7 days during his hospitalization. He was also started on hemodialysis and currently on Monday, Wednesday and Friday schedule. During his hospitalization, the patient was started on treatment with subcutaneous Velcade as well as Decadron. He received 2 cycles during his hospitalization. He was recently discharged from the hospital and he is here for evaluation and discussion of his treatment options. He is feeling fine today except for increasing fatigue and weakness. He denied having any significant nausea or vomiting. He has no chest pain, shortness of breath, cough or hemoptysis.  MEDICAL HISTORY: Past Medical History  Diagnosis Date  . Hypertension   . COPD (chronic obstructive pulmonary disease)   . Asthma   . Chronic kidney disease   . History of kidney stones   . History of hiatal hernia   . Headache     HISTORY OF CLUSTER HEADACHES  . Arthritis   . Complication of anesthesia     TROUBLE WAKING UP  . H/O colonoscopy with polypectomy   . Allergic rhinitis   . DVT (deep venous thrombosis)     RIGHT CALF AFTER A LITHOTRIPSY  . BPH (benign prostatic hypertrophy)   . Osteoarthritis     ALLERGIES:  has No Known Allergies.  MEDICATIONS:  Current Outpatient Prescriptions  Medication Sig Dispense Refill  . amLODipine (NORVASC) 5 MG tablet Take 5 mg by mouth daily.    Marland Kitchen atenolol (TENORMIN) 50 MG tablet Take 25 mg by mouth daily.    . budesonide-formoterol (SYMBICORT) 80-4.5 MCG/ACT inhaler Inhale 2 puffs into the lungs 2 (two) times daily.    . calcium carbonate (TUMS - DOSED IN MG ELEMENTAL CALCIUM) 500 MG chewable tablet Chew 1 tablet by mouth 3 (three) times daily.    . Cholecalciferol (VITAMIN  D PO) Take 1 tablet by mouth daily.    . fluticasone (FLONASE) 50 MCG/ACT nasal spray Place 1 spray into both nostrils daily.    Marland Kitchen omeprazole (PRILOSEC) 40 MG capsule Take 40 mg by mouth daily.    Marland Kitchen lanthanum (FOSRENOL) 500 MG chewable tablet Chew 1 tablet (500 mg  total) by mouth 3 (three) times daily with meals. (Patient not taking: Reported on 02/21/2015) 90 tablet 0  . methocarbamol (ROBAXIN) 500 MG tablet Take 1 tablet (500 mg total) by mouth every 8 (eight) hours as needed for muscle spasms. (Patient not taking: Reported on 02/21/2015) 20 tablet 1  . oxyCODONE (OXY IR/ROXICODONE) 5 MG immediate release tablet Take 1 tablet (5 mg total) by mouth every 6 (six) hours as needed for moderate pain. (Patient not taking: Reported on 02/21/2015) 10 tablet 0   No current facility-administered medications for this visit.    SURGICAL HISTORY:  Past Surgical History  Procedure Laterality Date  . Lithotripsy    . Back surgery    . Arthroscopy right knee    . Cholecystectomy    . Skin cancer removed from ears    . Insertion of dialysis catheter Right 02/07/2015    Procedure: INSERTION OF DIALYSIS CATHETER;  Surgeon: Rosetta Posner, MD;  Location: [facility];  Service: Vascular;  Laterality: Right;  . Av fistula placement Left 02/07/2015    Procedure: ARTERIOVENOUS (AV) FISTULA CREATION;  Surgeon: Rosetta Posner, MD;  Location: [facility];  Service: Vascular;  Laterality: Left;  . Removal of a dialysis catheter Left 02/07/2015    Procedure: Removal of  Temporary catheter in left neck;  Surgeon: Rosetta Posner, MD;  Location: [facility];  Service: Vascular;  Laterality: Left;    REVIEW OF SYSTEMS:  Constitutional: positive for fatigue and weight loss Eyes: negative Ears, nose, mouth, throat, and face: negative Respiratory: negative Cardiovascular: negative Gastrointestinal: negative Genitourinary:negative Integument/breast: negative Hematologic/lymphatic: negative Musculoskeletal:negative Neurological: negative Behavioral/Psych: negative Endocrine: negative Allergic/Immunologic: negative   PHYSICAL EXAMINATION: General appearance: alert, cooperative, fatigued and no distress Head: Normocephalic, without obvious abnormality, atraumatic Neck: no adenopathy, no JVD, supple,  symmetrical, trachea midline and thyroid not enlarged, symmetric, no tenderness/mass/nodules Lymph nodes: Cervical, supraclavicular, and axillary nodes normal. Resp: clear to auscultation bilaterally Back: symmetric, no curvature. ROM normal. No CVA tenderness. Cardio: regular rate and rhythm, S1, S2 normal, no murmur, click, rub or gallop GI: soft, non-tender; bowel sounds normal; no masses,  no organomegaly Extremities: extremities normal, atraumatic, no cyanosis or edema Neurologic: Alert and oriented X 3, normal strength and tone. Normal symmetric reflexes. Normal coordination and gait  ECOG PERFORMANCE STATUS: 1 - Symptomatic but completely ambulatory  Blood pressure 123/53, pulse 70, temperature 98.5 F (36.9 C), temperature source Oral, resp. rate 18, height _0  (1.676 m), weight 190 lb 14.4 oz (86.592 kg), SpO2 96 %.  LABORATORY DATA: Lab Results  Component Value Date   WBC 6.8 02/16/2015   HGB 9.1* 02/16/2015   HCT 27.2* 02/16/2015   MCV 94.4 02/16/2015   PLT 122* 02/16/2015      Chemistry      Component Value Date/Time   NA 140 02/16/2015 0951   K 3.3* 02/16/2015 0951   CL 101 02/16/2015 0951   CO2 28 02/16/2015 0951   BUN 27* 02/16/2015 0951   CREATININE 8.28* 02/16/2015 0951      Component Value Date/Time   CALCIUM 8.6* 02/16/2015 0951   ALKPHOS 71 02/02/2015 0454   AST 29 02/02/2015 0454  ALT 19 02/02/2015 0454   BILITOT 0.9 02/02/2015 0454       RADIOGRAPHIC STUDIES: Ct Abdomen Pelvis Wo Contrast  01/30/2015   CLINICAL DATA:  81 year old male with abdominal distention, renal failure, right flank pain, oliguria. Initial encounter.  EXAM: CT ABDOMEN AND PELVIS WITHOUT CONTRAST  TECHNIQUE: Multidetector CT imaging of the abdomen and pelvis was performed following the standard protocol without IV contrast.  COMPARISON:  CT Abdomen and Pelvis 05/29/2014 and earlier.  FINDINGS: New small layering right pleural effusion. Trace layering left pleural effusion. No  pericardial effusion. Septal thickening at both lung bases. Mild dependent atelectasis.  Stable visualized osseous structures. L4 hemivertebra type appearance re- identified.  Stable fat containing left inguinal hernia. No pelvic free fluid. Foley catheter in the bladder which is decompressed. Decompressed distal colon.  Sigmoid diverticulosis with no active inflammation. Occasional diverticula in the more proximal colon without active inflammation. Fluid in the ascending colon. Negative appendix and terminal ileum. No dilated small bowel. Oral contrast in the stomach and proximal small bowel, has not yet reached the distal small bowel. Mildly distended stomach.  No pneumoperitoneum. No free fluid in the abdomen. Noncontrast liver remarkable for decreased density in keeping with steatosis. Surgically absent gallbladder. Negative non contrast spleen, pancreas and adrenal glands. Aortoiliac calcified atherosclerosis noted. Mild ectasia of the infrarenal aorta is stable.  Negative non contrast right kidney. No right nephrolithiasis, hydronephrosis, or perinephric stranding. Stable left kidney with midpole cyst. No left hydronephrosis or renal calculus.  No hydroureter, but there is mild bilateral periureteral stranding (series 2, images 47-49). This does not continue all the way to the ureterovesical junction. The appearance is nonspecific. There is no lymphadenopathy in the abdomen or pelvis.  IMPRESSION: 1. New small layering pleural effusions and pulmonary septal thickening at the lung bases compatible with a degree of interstitial edema. 2. No obstructive uropathy or urologic calculus. Mild nonspecific bilateral periureteral stranding. Query ascending urinary tract infection. Bladder decompressed by Foley catheter. 3. Otherwise no acute or inflammatory changes in the abdomen or pelvis. Hepatic steatosis. Diverticulosis of the colon.   Electronically Signed   By: Genevie Ann M.D.   On: 01/30/2015 15:43   Dg Chest 2  View  01/30/2015   CLINICAL DATA:  Fatigue.  Shortness of breath.  EXAM: CHEST  2 VIEW  COMPARISON:  11/08/2014.  FINDINGS: Mediastinum and hilar structures are normal. No focal infiltrate. Cardiomegaly with mild pulmonary vascular prominence. Mild interstitial prominence. Small right pleural effusion. No acute bony abnormality.  IMPRESSION: 1. Cardiomegaly with mild pulmonary vascular prominence and interstitial prominence. Mild congestive heart failure cannot be excluded . 2. Small right pleural effusion .   Electronically Signed   By: Marcello Moores  Register   On: 01/30/2015 11:26   Dg Abd 1 View  01/30/2015   CLINICAL DATA:  Abdominal distention.  EXAM: ABDOMEN - 1 VIEW  COMPARISON:  None.  FINDINGS: Cholecystectomy. Soft tissue structures are unremarkable. Nondilated air-filled loops of small bowel are noted. Colonic gas pattern is unremarkable. No free air. Pelvic calcifications consistent phleboliths. Degenerative changes and scoliosis lumbar spine. Degenerative changes both hips.  IMPRESSION: Multiple air-filled loops of nondilated small bowel. Follow-up abdominal series is suggested to exclude developing small bowel distention.   Electronically Signed   By: Marcello Moores  Register   On: 01/30/2015 11:28   US Biopsy  02/02/2015   INDICATION: Acute renal failure of uncertain etiology. Please perform ultrasound-guided random renal biopsy for tissue diagnostic purposes  EXAM: ULTRASOUND GUIDED RENAL  BIOPSY  COMPARISON:  CT abdomen pelvis - 01/30/2015  MEDICATIONS: Fentanyl 25 mcg IV; Versed 1 mg IV  ANESTHESIA/SEDATION: Total Moderate Sedation time  15 minutes  COMPLICATIONS: None immediate  PROCEDURE: Informed written consent was obtained from the patient after a discussion of the risks, benefits and alternatives to treatment. The patient understands and consents the procedure. A timeout was performed prior to the initiation of the procedure.  Ultrasound scanning was performed of the bilateral flanks. The inferior  pole of the left kidney was selected for biopsy due to location and sonographic window. The procedure was planned. The operative site was prepped and draped in the usual sterile fashion. The overlying soft tissues were anesthetized with 1% lidocaine with epinephrine. A 17 gauge core needle biopsy device was advanced into the inferior cortex of the left kidney and 2 core biopsies were obtained under direct ultrasound guidance. Real time pathologic review confirmed adequate tissue acquisition. Images were saved for documentation purposes. The biopsy device was removed and hemostasis was obtained with manual compression. Post procedural scanning was negative for significant post procedural hemorrhage or additional complication. A dressing was placed. The patient tolerated the procedure well without immediate post procedural complication.  IMPRESSION: Technically successful ultrasound guided left renal biopsy.   Electronically Signed   By: Sandi Mariscal M.D.   On: 02/02/2015 11:46   Ct Biopsy  02/05/2015   CLINICAL DATA:  81 year old male with a history of myeloma. He has been referred for CT-guided bone marrow biopsy  EXAM: CT-GUIDED BIOPSY of bone marrow  MEDICATIONS AND MEDICAL HISTORY: Versed 1.0 mg, Fentanyl 50 mcg.  Additional Medications: None.  ANESTHESIA/SEDATION: Moderate sedation time: 10 minutes  PROCEDURE: The procedure risks, benefits, and alternatives were explained to the patient. Questions regarding the procedure were encouraged and answered. The patient understands and consents to the procedure.  Scout CT of the pelvis was performed for surgical planning purposes.  The posterior pelvis was prepped with Betadinein a sterile fashion, and a sterile drape was applied covering the operative field. A sterile gown and sterile gloves were used for the procedure. Local anesthesia was provided with 1% Lidocaine.  We targeted the left posterior iliac bone for biopsy. The skin and subcutaneous tissues were  infiltrated with 1% lidocaine without epinephrine. A small stab incision was made with an 11 blade scalpel, and an 11 gauge Murphy needle was advanced with CT guidance to the posterior cortex. Manual forced was used to advance the needle through the posterior cortex and the stylet was removed. A bone marrow aspirate was retrieved and passed to a cytotechnologist in the room. The Murphy needle was then advanced without the stylet for a core biopsy. The core biopsy was retrieved and also passed to a cytotechnologist.  Manual pressure was used for hemostasis and a sterile dressing was placed.  No complications were encountered no significant blood loss was encountered.  Patient tolerated the procedure well and remained hemodynamically stable throughout.  Marland Kitchen  FINDINGS: Scout image demonstrates safe approach to posterior iliac bone.  Images during the case demonstrate placement of 11 gauge Murphy needle  COMPLICATIONS: None  IMPRESSION: Status post CT-guided bone marrow biopsy, with tissue specimen sent to pathology for complete histopathologic analysis  Signed,  Dulcy Fanny. Earleen Newport, DO  [facility]  [facility]   Electronically Signed   By: Corrie Mckusick D.O.   On: 02/05/2015 10:27   Dg Chest Port 1 View  02/07/2015   CLINICAL DATA:  Central line  placement portable exam 1215 hours compared to 01/31/2015  EXAM: PORTABLE CHEST - 1 VIEW  COMPARISON:  None.  FINDINGS: RIGHT jugular central venous catheter with tips projecting over RIGHT atrium.  Enlargement of cardiac silhouette.  Mediastinal contours and pulmonary vascularity normal.  Atherosclerotic calcification aorta.  Bibasilar atelectasis.  No gross infiltrate, pleural effusion or pneumothorax.  No acute osseous findings.  IMPRESSION: No pneumothorax following central line placement.  Bibasilar atelectasis.   Electronically Signed   By: Lavonia Dana M.D.   On: 02/07/2015 12:22   Dg Chest Port 1 View  01/31/2015   CLINICAL  DATA:  Initial encounter for hemodialysis catheter placement.  EXAM: PORTABLE CHEST - 1 VIEW  COMPARISON:  01/30/2015.  FINDINGS: 1142 hrs. Left IJ dialysis catheter tip overlies the left hilum. The tip projects slightly more lateral than typical seen which is probably related to rightward patient rotation. There is pulmonary vascular congestion without overt pulmonary edema. No focal airspace consolidation or pleural effusion. Cardiopericardial silhouette is at upper limits of normal for size. Telemetry leads overlie the chest. No evidence for pneumothorax.  IMPRESSION: 1. No evidence for pneumothorax status post central line placement.   Electronically Signed   By: Misty Stanley M.D.   On: 01/31/2015 12:02   Dg Chest Port 1 View  01/30/2015   CLINICAL DATA:  Dyspnea and high creatinine level  EXAM: PORTABLE CHEST - 1 VIEW  COMPARISON:  01/30/2015  FINDINGS: Pulmonary edema has developed since earlier today. No cardiomegaly when accounting for lower mediastinal fat. Negative aortic and hilar contours. Trace right pleural effusion.  IMPRESSION: Pulmonary edema, new from 11:30 a.m. earlier today.   Electronically Signed   By: Monte Fantasia M.D.   On: 01/30/2015 17:14   Dg Bone Survey Met  02/03/2015   CLINICAL DATA:  Multiple myeloma  EXAM: METASTATIC BONE SURVEY  COMPARISON:  Multiple exams, including 01/30/2015  FINDINGS: No punched out lucent cortical lesions characteristic of multiple myeloma identified.  Scattered degenerative findings. Aortoiliac atherosclerotic vascular disease. Left IJ line tip: SVC.  IMPRESSION: 1. No lucent lesions characteristic of multiple myeloma are identified.   Electronically Signed   By: Van Clines M.D.   On: 02/03/2015 21:30    ASSESSMENT AND PLAN: This is a very pleasant 81 years old white male recently diagnosed with multiple myeloma as well as kappa light chain nephropathy. The patient is currently undergoing hemodialysis on Monday Wednesday and Friday. I had  a lengthy discussion with the patient and his wife about his current condition and treatment options. I recommended for the patient to continue his current systemic therapy with weekly subcutaneous Velcade 1.3 MG/M2 in addition to weekly Decadron 40 mg orally. I will start the patient on prophylactic acyclovir 400 mg by mouth twice a day. We will continue to monitor the patient closely. He is expected to start cycle #3 of his treatment on 02/23/2015. He would come back for follow-up visit in 2 weeks for reevaluation and management of any adverse effect of his treatment. The patient was advised to call immediately if he has any concerning symptoms in the interval. The patient voices understanding of current disease status and treatment options and is in agreement with the current care plan.  All questions were answered. The patient knows to call the clinic with any problems, questions or concerns. We can certainly see the patient much sooner if necessary.  I spent 20 minutes counseling the patient face to face. The total time spent in the appointment  was 30 minutes.  Disclaimer: This note was dictated with voice recognition software. Similar sounding words can inadvertently be transcribed and may not be corrected upon review.

## 2015-02-22 ENCOUNTER — Other Ambulatory Visit: Payer: Self-pay | Admitting: Medical Oncology

## 2015-02-22 ENCOUNTER — Telehealth: Payer: Self-pay | Admitting: *Deleted

## 2015-02-22 ENCOUNTER — Telehealth: Payer: Self-pay | Admitting: Internal Medicine

## 2015-02-22 DIAGNOSIS — C9000 Multiple myeloma not having achieved remission: Secondary | ICD-10-CM

## 2015-02-22 NOTE — Telephone Encounter (Signed)
Patient's wife walked in with prescriptions and questions use.  "Dexamethasone reads take with chemotherapy.  Does this mean he has to bring this here to take it.  This nurse advised that he take it in the morning with food.  Reports he has to receive dialysis in the morning at 7:00 before coming to chemotherapy.  Clarified with pharmacy if this drug will be dialyzed.  Per Evelena Peat RpH it can be taken before dialysis.  Wrote on pharmacy bag to take dexamethasone 40 mg with food early morning before comes here for treatment.  No further questions.

## 2015-02-22 NOTE — Telephone Encounter (Signed)
Pt

## 2015-02-23 ENCOUNTER — Other Ambulatory Visit: Payer: PPO

## 2015-02-23 ENCOUNTER — Ambulatory Visit (HOSPITAL_BASED_OUTPATIENT_CLINIC_OR_DEPARTMENT_OTHER): Payer: PPO

## 2015-02-23 ENCOUNTER — Telehealth: Payer: Self-pay | Admitting: Internal Medicine

## 2015-02-23 ENCOUNTER — Other Ambulatory Visit (HOSPITAL_BASED_OUTPATIENT_CLINIC_OR_DEPARTMENT_OTHER): Payer: PPO

## 2015-02-23 VITALS — BP 127/52 | HR 80 | Temp 97.6°F | Resp 18

## 2015-02-23 DIAGNOSIS — C9000 Multiple myeloma not having achieved remission: Secondary | ICD-10-CM

## 2015-02-23 DIAGNOSIS — Z5112 Encounter for antineoplastic immunotherapy: Secondary | ICD-10-CM

## 2015-02-23 LAB — CBC WITH DIFFERENTIAL/PLATELET
BASO%: 1.4 % (ref 0.0–2.0)
Basophils Absolute: 0.1 10*3/uL (ref 0.0–0.1)
EOS ABS: 0.3 10*3/uL (ref 0.0–0.5)
EOS%: 5.4 % (ref 0.0–7.0)
HEMATOCRIT: 29.3 % — AB (ref 38.4–49.9)
HEMOGLOBIN: 9.7 g/dL — AB (ref 13.0–17.1)
LYMPH#: 1.2 10*3/uL (ref 0.9–3.3)
LYMPH%: 23.4 % (ref 14.0–49.0)
MCH: 32.3 pg (ref 27.2–33.4)
MCHC: 33.2 g/dL (ref 32.0–36.0)
MCV: 97.3 fL (ref 79.3–98.0)
MONO#: 0.8 10*3/uL (ref 0.1–0.9)
MONO%: 16.3 % — AB (ref 0.0–14.0)
NEUT#: 2.7 10*3/uL (ref 1.5–6.5)
NEUT%: 53.5 % (ref 39.0–75.0)
PLATELETS: 177 10*3/uL (ref 140–400)
RBC: 3.01 10*6/uL — AB (ref 4.20–5.82)
RDW: 20.6 % — ABNORMAL HIGH (ref 11.0–14.6)
WBC: 5 10*3/uL (ref 4.0–10.3)

## 2015-02-23 LAB — COMPREHENSIVE METABOLIC PANEL (CC13)
ALBUMIN: 3.5 g/dL (ref 3.5–5.0)
ALT: 14 U/L (ref 0–55)
AST: 21 U/L (ref 5–34)
Alkaline Phosphatase: 74 U/L (ref 40–150)
Anion Gap: 11 mEq/L (ref 3–11)
BUN: 8.4 mg/dL (ref 7.0–26.0)
CO2: 28 mEq/L (ref 22–29)
Calcium: 9.1 mg/dL (ref 8.4–10.4)
Chloride: 100 mEq/L (ref 98–109)
Creatinine: 4.3 mg/dL (ref 0.7–1.3)
EGFR: 12 mL/min/{1.73_m2} — AB (ref >90)
Glucose: 163 mg/dl — ABNORMAL HIGH (ref 70–140)
POTASSIUM: 3.2 meq/L — AB (ref 3.5–5.1)
Sodium: 139 mEq/L (ref 136–145)
Total Bilirubin: 1.21 mg/dL — ABNORMAL HIGH (ref 0.20–1.20)
Total Protein: 6.4 g/dL (ref 6.4–8.3)

## 2015-02-23 MED ORDER — PROCHLORPERAZINE MALEATE 10 MG PO TABS: 10.0000 mg | ORAL_TABLET | Freq: Four times a day (QID) | ORAL | Status: DC | PRN

## 2015-02-23 MED ORDER — BORTEZOMIB CHEMO SQ INJECTION 3.5 MG (2.5MG/ML)
1.3000 mg/m2 | Freq: Once | INTRAMUSCULAR | Status: AC
  Administered 2015-02-23: 2.5 mg via SUBCUTANEOUS
  Filled 2015-02-23: qty 2.5

## 2015-02-23 MED ORDER — ONDANSETRON HCL 8 MG PO TABS
ORAL_TABLET | ORAL | Status: AC
  Filled 2015-02-23: qty 1

## 2015-02-23 MED ORDER — ONDANSETRON HCL 8 MG PO TABS
8.0000 mg | ORAL_TABLET | Freq: Once | ORAL | Status: AC
  Administered 2015-02-23: 8 mg via ORAL

## 2015-02-23 NOTE — Patient Instructions (Signed)
[facility] Discharge Instructions for Patients Receiving Chemotherapy  Today you received the following chemotherapy agents Velcade. To help prevent nausea and vomiting after your treatment, we encourage you to take your nausea medication as directed.  If you develop nausea and vomiting that is not controlled by your nausea medication, call the clinic.   BELOW ARE SYMPTOMS THAT SHOULD BE REPORTED IMMEDIATELY:  *FEVER GREATER THAN 100.5 F  *CHILLS WITH OR WITHOUT FEVER  NAUSEA AND VOMITING THAT IS NOT CONTROLLED WITH YOUR NAUSEA MEDICATION  *UNUSUAL SHORTNESS OF BREATH  *UNUSUAL BRUISING OR BLEEDING  TENDERNESS IN MOUTH AND THROAT WITH OR WITHOUT PRESENCE OF ULCERS  *URINARY PROBLEMS  *BOWEL PROBLEMS  UNUSUAL RASH Items with * indicate a potential emergency and should be followed up as soon as possible.  Feel free to call the clinic you have any questions or concerns. The clinic phone number is [phone].  Please show the CHEMO ALERT CARD at check-in to the Emergency Department and triage nurse.    

## 2015-02-23 NOTE — Telephone Encounter (Signed)
Lft msg for pt confirming labs/ov and chemo added and also chemo education added today that any family member can be at it, expressed that this is very important that pt gets this in before chemo start. Pt is having dialysis and per MD pt has been moved up in the morning for that and will be doing treatments in the pm with [place]..... KJ

## 2015-02-23 NOTE — Progress Notes (Signed)
Reviewed labs with Dr. Benay Spice; OK to proceed with tx today

## 2015-02-27 ENCOUNTER — Telehealth: Payer: Self-pay | Admitting: Internal Medicine

## 2015-02-27 ENCOUNTER — Telehealth: Payer: Self-pay | Admitting: Medical Oncology

## 2015-02-27 ENCOUNTER — Telehealth: Payer: Self-pay | Admitting: *Deleted

## 2015-02-27 DIAGNOSIS — K5900 Constipation, unspecified: Secondary | ICD-10-CM

## 2015-02-27 MED ORDER — BISACODYL 5 MG PO TBEC: 5.0000 mg | DELAYED_RELEASE_TABLET | Freq: Every day | ORAL | Status: AC | PRN

## 2015-02-27 NOTE — Telephone Encounter (Signed)
TC from patient 's wife regarding scheduling up coming appts. Per wife, she states that Mr. Vanwieren is supposed to be getting weekly Velcade on Fridays. He is also getting Hemodialysis MWF (relatively new HD pt)  She didn't know upcoming appts for Dr. Julien Nordmann, labs and Velcade. Reviewed appts-noted no appts scheduled at this time for this week or following weeks. Mr. Schappert has HD on Fridays @ 7 am - 11 so he can have  appts here @ [place] in the afternoon. Wife needs to be able to coordinate [facility]ts and [facility]  appts on Fridays. Pt is able to come after 1pm on Fridays.  Reviewed medications per wife request-several have been discontinued per his nephrologist . D/c's meds: Atenolol, oxycodone, Robaxin, Vit. D, Fosrenol. He is taking Dulcolax for constipation issues. Updated med list.

## 2015-02-27 NOTE — Telephone Encounter (Signed)
Called Scheduler about this note.

## 2015-02-27 NOTE — Telephone Encounter (Signed)
Does not feel too good today. Reports he had constipation but took laxative and bowels are working okay. He is eating and adjusting his diet per dietician at dialysis.

## 2015-02-27 NOTE — Telephone Encounter (Signed)
S/w pt's wife confirming upcoming visits, will have pt an updated calendar when he comes in at next visit... KJ sent msg to move chemo down on 07/29 due to adding office visit.Marland KitchenMarland KitchenMarland Kitchen

## 2015-02-28 ENCOUNTER — Other Ambulatory Visit: Payer: Self-pay | Admitting: *Deleted

## 2015-02-28 NOTE — Patient Outreach (Signed)
Attempt made to contact spouse [name] (on [facility] consent form), f/u on scheduling initial  home visit for pt.    HIPPA compliant voice message left with contact number.  If no response, will try again.    Zara Chess.   [place] [facility]  [phone]

## 2015-02-28 NOTE — Patient Outreach (Signed)
Returned phone call to spouse [name] (on [facility] consent form) to voice message left earlier as this RN CM left a voice message with spouse today.    Discussed with spouse [facility] services which include a home visit from RN CM, assess pt's needs (recent hospital discharge) to which spouse agreed.  Spouse states pt is available tomorrow, no dialysis so home visit was scheduled for 7/21.     Zara Chess.   [place] [facility]  [phone]

## 2015-03-01 ENCOUNTER — Other Ambulatory Visit: Payer: Self-pay | Admitting: *Deleted

## 2015-03-01 ENCOUNTER — Encounter: Payer: Self-pay | Admitting: *Deleted

## 2015-03-02 ENCOUNTER — Encounter: Payer: Self-pay | Admitting: *Deleted

## 2015-03-02 ENCOUNTER — Telehealth: Payer: Self-pay | Admitting: *Deleted

## 2015-03-02 ENCOUNTER — Ambulatory Visit (HOSPITAL_BASED_OUTPATIENT_CLINIC_OR_DEPARTMENT_OTHER): Payer: PPO

## 2015-03-02 ENCOUNTER — Other Ambulatory Visit (HOSPITAL_BASED_OUTPATIENT_CLINIC_OR_DEPARTMENT_OTHER): Payer: PPO

## 2015-03-02 VITALS — BP 155/61 | HR 84 | Temp 99.3°F | Resp 16

## 2015-03-02 DIAGNOSIS — Z5112 Encounter for antineoplastic immunotherapy: Secondary | ICD-10-CM

## 2015-03-02 DIAGNOSIS — C9000 Multiple myeloma not having achieved remission: Secondary | ICD-10-CM | POA: Diagnosis not present

## 2015-03-02 LAB — COMPREHENSIVE METABOLIC PANEL (CC13)
ALBUMIN: 3.3 g/dL — AB (ref 3.5–5.0)
ALT: 13 U/L (ref 0–55)
AST: 20 U/L (ref 5–34)
Alkaline Phosphatase: 71 U/L (ref 40–150)
Anion Gap: 9 mEq/L (ref 3–11)
BUN: 8.1 mg/dL (ref 7.0–26.0)
CHLORIDE: 105 meq/L (ref 98–109)
CO2: 28 mEq/L (ref 22–29)
Calcium: 8.4 mg/dL (ref 8.4–10.4)
Creatinine: 3.9 mg/dL (ref 0.7–1.3)
EGFR: 13 mL/min/{1.73_m2} — AB (ref >90)
GLUCOSE: 156 mg/dL — AB (ref 70–140)
Potassium: 3.4 mEq/L — ABNORMAL LOW (ref 3.5–5.1)
SODIUM: 142 meq/L (ref 136–145)
Total Bilirubin: 1.36 mg/dL — ABNORMAL HIGH (ref 0.20–1.20)
Total Protein: 6 g/dL — ABNORMAL LOW (ref 6.4–8.3)

## 2015-03-02 LAB — CBC WITH DIFFERENTIAL/PLATELET
BASO%: 0.5 % (ref 0.0–2.0)
Basophils Absolute: 0 10*3/uL (ref 0.0–0.1)
EOS%: 3.7 % (ref 0.0–7.0)
Eosinophils Absolute: 0.1 10*3/uL (ref 0.0–0.5)
HCT: 29.3 % — ABNORMAL LOW (ref 38.4–49.9)
HEMOGLOBIN: 10 g/dL — AB (ref 13.0–17.1)
LYMPH#: 1 10*3/uL (ref 0.9–3.3)
LYMPH%: 25.4 % (ref 14.0–49.0)
MCH: 33.3 pg (ref 27.2–33.4)
MCHC: 34.1 g/dL (ref 32.0–36.0)
MCV: 97.7 fL (ref 79.3–98.0)
MONO#: 0.7 10*3/uL (ref 0.1–0.9)
MONO%: 18.1 % — ABNORMAL HIGH (ref 0.0–14.0)
NEUT%: 52.3 % (ref 39.0–75.0)
NEUTROS ABS: 2 10*3/uL (ref 1.5–6.5)
PLATELETS: 116 10*3/uL — AB (ref 140–400)
RBC: 3 10*6/uL — ABNORMAL LOW (ref 4.20–5.82)
RDW: 18.3 % — AB (ref 11.0–14.6)
WBC: 3.8 10*3/uL — ABNORMAL LOW (ref 4.0–10.3)

## 2015-03-02 MED ORDER — DEXAMETHASONE 4 MG PO TABS: 40.0000 mg | ORAL_TABLET | ORAL | Status: AC

## 2015-03-02 MED ORDER — BORTEZOMIB CHEMO SQ INJECTION 3.5 MG (2.5MG/ML)
1.3000 mg/m2 | Freq: Once | INTRAMUSCULAR | Status: AC
  Administered 2015-03-02: 2.5 mg via SUBCUTANEOUS
  Filled 2015-03-02: qty 2.5

## 2015-03-02 MED ORDER — ONDANSETRON HCL 8 MG PO TABS
ORAL_TABLET | ORAL | Status: AC
  Filled 2015-03-02: qty 1

## 2015-03-02 MED ORDER — ONDANSETRON HCL 8 MG PO TABS
8.0000 mg | ORAL_TABLET | Freq: Once | ORAL | Status: AC
  Administered 2015-03-02: 8 mg via ORAL

## 2015-03-02 NOTE — Telephone Encounter (Signed)
Call from pt's pharmacy to clarify Decadron instructions. Confirmed pt is to take Decadron 40 mg weekly on same day as chemo.

## 2015-03-02 NOTE — Telephone Encounter (Deleted)
Call from pt's pharmacy to clarify Decadron instructions. Rx was not clear. Confirmed pt is to take Decadron 40 weekly on same day of chemo.

## 2015-03-02 NOTE — Progress Notes (Signed)
14:30 - Pt reported that with last SQ Velcade injection he noted a large reddish/pink area where injection took place.  He stated it did not itch or hurt or bother him in anyway, just something he noticed.  I assessed area and it was completely gone.  Pt reported it took several days to go away.  I notified pharmacy of this previous reaction. They stated it was probably just some irritation of some sort but good to be aware of moving forward.  I informed pt if this were to happen again with this injection, to give [place] a call and possibly come in for assessment of area.  Pt and wife verbalized understanding and no further questions prior to discharge.

## 2015-03-02 NOTE — Patient Outreach (Signed)
[facility]) Care Management   03/02/2015  Justin Woods March 08, 1931 [id number]  Justin Woods is an 81 y.o. male  Subjective:  Pt reports doing good since hospital discharge, tolerating dialysis, feel good today.  Pt states no nausea/vomiting from chemo.  Spouse states pt  has had 3 treatments ( for multiple myeloma) since coming Home.  Spouse states they were told the myeloma attacked kidneys, shutting it down, in Kidney failure.  Pt states  COPD doing good.  Spouse states pt's BP good, taken off Atenolol.  Spouse states have not received any calls/messages from insurance company since pt's discharge home.   Spouse states pt is to f/u with  Dr. Lysle Rubens 7/28.   Objective:  Filed Vitals:   03/01/15 1348  BP: 126/60  Pulse: 71  Resp: 20     ROS  Physical Exam  Constitutional: He is oriented to person, place, and time. He appears well-developed.  Cardiovascular: Normal rate.   Respiratory: Breath sounds normal.  GI: Soft. Bowel sounds are normal.  Musculoskeletal: Normal range of motion.  Neurological: He is alert and oriented to person, place, and time.  Skin: Skin is warm and dry.  Psychiatric: He has a normal mood and affect. His behavior is normal. Judgment and thought content normal.  Hx reviewed with pt/spouse during home visit   Current Medications:  Reviewed pt's medications with spouse  Current Outpatient Prescriptions  Medication Sig Dispense Refill  . acyclovir (ZOVIRAX) 400 MG tablet Take 1 tablet (400 mg total) by mouth 2 (two) times daily. 60 tablet 3  . amLODipine (NORVASC) 5 MG tablet Take 5 mg by mouth daily.    . bisacodyl (DULCOLAX) 5 MG EC tablet Take 1 tablet (5 mg total) by mouth daily as needed for moderate constipation. 30 tablet 0  . budesonide-formoterol (SYMBICORT) 80-4.5 MCG/ACT inhaler Inhale 2 puffs into the lungs 2 (two) times daily.    . calcium carbonate (TUMS - DOSED IN MG ELEMENTAL CALCIUM) 500 MG chewable tablet Chew 1  tablet by mouth 3 (three) times daily.    Marland Kitchen dexamethasone (DECADRON) 4 MG tablet 10 tab po qd weekly with chemo 40 tablet 4  . fluticasone (FLONASE) 50 MCG/ACT nasal spray Place 1 spray into both nostrils daily.    Marland Kitchen omeprazole (PRILOSEC) 40 MG capsule Take 40 mg by mouth daily.    . prochlorperazine (COMPAZINE) 10 MG tablet Take 1 tablet (10 mg total) by mouth every 6 (six) hours as needed for nausea or vomiting. (Patient not taking: Reported on 03/01/2015) 30 tablet 0   No current facility-administered medications for this visit.    Functional Status:   In your present state of health, do you have any difficulty performing the following activities: 03/01/2015 01/31/2015  Hearing? Tempie Donning  Vision? Y N  Difficulty concentrating or making decisions? N N  Walking or climbing stairs? N N  Dressing or bathing? N N  Doing errands, shopping? N -  Preparing Food and eating ? N -  Using the Toilet? N -  In the past six months, have you accidently leaked urine? N -  Do you have problems with loss of bowel control? N -  Managing your Medications? N -  Managing your Finances? N -  Housekeeping or managing your Housekeeping? Y -    Fall/Depression Screening:    PHQ 2/9 Scores 03/01/2015  PHQ - 2 Score 0    Assessment:   Post hospitalization- pt continues to recover, f/u at dialysis,  chemo.   No c/o pain, reports                              Good appetite.    Plan:  Pt to continue to f/u at dialysis Monday, Wednesday, Friday.            Pt to continue to comply with renal diet, maintain nutrition.             Pt to f/u with chemo as ordered            Pt to f/u with Dr. Lysle Rubens 7/28.             RN CM to f/u again telephonically 7/28             RN CM to inform Dr. Lysle Rubens of [facility] involvement short term (fax encounter via Epic as                    Well as send involvement letter).   [facility] CM Care Plan Problem One        Patient Outreach from 03/01/2015 in [place]  Problem One  Recent hospital discharge -  ARF, multiple [place] for Problem One  Active   THN Long Term Goal (31-90 days)  Pt would not readmit in 31 days post day of discharge    THN Long Term Goal Start Date  03/01/15   Interventions for Problem One Long Term Goal  Discussed with pt importance of nutrition post discharge, rest, f/u with MD    THN CM Short Term Goal #1 (0-30 days)  Pt would continue to maintain nutritious diet in the next 30 days    THN CM Short Term Goal #1 Start Date  03/01/15   Interventions for Short Term Goal #1  Reviewed with pt importance of nutrition after a hospital discharge, when receiving dialysis, chemo.       Justin Woods.   [place] [facility] Management  [phone]

## 2015-03-02 NOTE — Progress Notes (Signed)
Okay to treat with today's labs per Dr. Shadad 

## 2015-03-02 NOTE — Patient Instructions (Signed)
[facility] Discharge Instructions for Patients Receiving Chemotherapy  Today you received the following chemotherapy agents Velcade. To help prevent nausea and vomiting after your treatment, we encourage you to take your nausea medication as directed.  If you develop nausea and vomiting that is not controlled by your nausea medication, call the clinic.   BELOW ARE SYMPTOMS THAT SHOULD BE REPORTED IMMEDIATELY:  *FEVER GREATER THAN 100.5 F  *CHILLS WITH OR WITHOUT FEVER  NAUSEA AND VOMITING THAT IS NOT CONTROLLED WITH YOUR NAUSEA MEDICATION  *UNUSUAL SHORTNESS OF BREATH  *UNUSUAL BRUISING OR BLEEDING  TENDERNESS IN MOUTH AND THROAT WITH OR WITHOUT PRESENCE OF ULCERS  *URINARY PROBLEMS  *BOWEL PROBLEMS  UNUSUAL RASH Items with * indicate a potential emergency and should be followed up as soon as possible.  Feel free to call the clinic you have any questions or concerns. The clinic phone number is [phone].  Please show the CHEMO ALERT CARD at check-in to the Emergency Department and triage nurse.    

## 2015-03-05 ENCOUNTER — Emergency Department (HOSPITAL_COMMUNITY): Payer: PPO

## 2015-03-05 ENCOUNTER — Encounter (HOSPITAL_COMMUNITY): Payer: Self-pay | Admitting: Emergency Medicine

## 2015-03-05 ENCOUNTER — Other Ambulatory Visit: Payer: Self-pay

## 2015-03-05 ENCOUNTER — Ambulatory Visit: Payer: PPO

## 2015-03-05 ENCOUNTER — Inpatient Hospital Stay: Payer: PPO | Admitting: Internal Medicine

## 2015-03-05 ENCOUNTER — Emergency Department (HOSPITAL_COMMUNITY)
Admission: EM | Admit: 2015-03-05 | Discharge: 2015-03-05 | Disposition: A | Discharge status: Home / Self Care | Payer: PPO | Attending: Emergency Medicine | Admitting: Emergency Medicine

## 2015-03-05 DIAGNOSIS — R42 Dizziness and giddiness: Secondary | ICD-10-CM | POA: Insufficient documentation

## 2015-03-05 DIAGNOSIS — Z87891 Personal history of nicotine dependence: Secondary | ICD-10-CM | POA: Diagnosis not present

## 2015-03-05 DIAGNOSIS — Z9889 Other specified postprocedural states: Secondary | ICD-10-CM | POA: Insufficient documentation

## 2015-03-05 DIAGNOSIS — Z79899 Other long term (current) drug therapy: Secondary | ICD-10-CM | POA: Diagnosis not present

## 2015-03-05 DIAGNOSIS — M1990 Unspecified osteoarthritis, unspecified site: Secondary | ICD-10-CM | POA: Insufficient documentation

## 2015-03-05 DIAGNOSIS — I129 Hypertensive chronic kidney disease with stage 1 through stage 4 chronic kidney disease, or unspecified chronic kidney disease: Secondary | ICD-10-CM | POA: Diagnosis not present

## 2015-03-05 DIAGNOSIS — Z7951 Long term (current) use of inhaled steroids: Secondary | ICD-10-CM | POA: Diagnosis not present

## 2015-03-05 DIAGNOSIS — Z86718 Personal history of other venous thrombosis and embolism: Secondary | ICD-10-CM | POA: Diagnosis not present

## 2015-03-05 DIAGNOSIS — J449 Chronic obstructive pulmonary disease, unspecified: Secondary | ICD-10-CM | POA: Insufficient documentation

## 2015-03-05 DIAGNOSIS — Z8669 Personal history of other diseases of the nervous system and sense organs: Secondary | ICD-10-CM | POA: Insufficient documentation

## 2015-03-05 DIAGNOSIS — Z87442 Personal history of urinary calculi: Secondary | ICD-10-CM | POA: Diagnosis not present

## 2015-03-05 DIAGNOSIS — N189 Chronic kidney disease, unspecified: Secondary | ICD-10-CM | POA: Diagnosis not present

## 2015-03-05 LAB — COMPREHENSIVE METABOLIC PANEL
ALBUMIN: 3.5 g/dL (ref 3.5–5.0)
ALT: 18 U/L (ref 17–63)
AST: 23 U/L (ref 15–41)
Alkaline Phosphatase: 64 U/L (ref 38–126)
Anion gap: 14 (ref 5–15)
BILIRUBIN TOTAL: 0.7 mg/dL (ref 0.3–1.2)
BUN: 63 mg/dL — AB (ref 6–20)
CALCIUM: 9 mg/dL (ref 8.9–10.3)
CO2: 23 mmol/L (ref 22–32)
CREATININE: 11.98 mg/dL — AB (ref 0.61–1.24)
Chloride: 99 mmol/L — ABNORMAL LOW (ref 101–111)
GFR calc Af Amer: 4 mL/min — ABNORMAL LOW (ref >60)
GFR calc non Af Amer: 3 mL/min — ABNORMAL LOW (ref >60)
Glucose, Bld: 88 mg/dL (ref 65–99)
Potassium: 3.8 mmol/L (ref 3.5–5.1)
Sodium: 136 mmol/L (ref 135–145)
TOTAL PROTEIN: 5.8 g/dL — AB (ref 6.5–8.1)

## 2015-03-05 LAB — CBC
HEMATOCRIT: 31.9 % — AB (ref 39.0–52.0)
Hemoglobin: 10.8 g/dL — ABNORMAL LOW (ref 13.0–17.0)
MCH: 33.3 pg (ref 26.0–34.0)
MCHC: 33.9 g/dL (ref 30.0–36.0)
MCV: 98.5 fL (ref 78.0–100.0)
PLATELETS: 97 10*3/uL — AB (ref 150–400)
RBC: 3.24 MIL/uL — ABNORMAL LOW (ref 4.22–5.81)
RDW: 18.6 % — ABNORMAL HIGH (ref 11.5–15.5)
WBC: 8 10*3/uL (ref 4.0–10.5)

## 2015-03-05 LAB — DIFFERENTIAL
BASOS PCT: 0 % (ref 0–1)
Basophils Absolute: 0 10*3/uL (ref 0.0–0.1)
Eosinophils Absolute: 0 10*3/uL (ref 0.0–0.7)
Eosinophils Relative: 0 % (ref 0–5)
LYMPHS ABS: 1.9 10*3/uL (ref 0.7–4.0)
Lymphocytes Relative: 24 % (ref 12–46)
MONO ABS: 0.9 10*3/uL (ref 0.1–1.0)
MONOS PCT: 11 % (ref 3–12)
Neutro Abs: 5.2 10*3/uL (ref 1.7–7.7)
Neutrophils Relative %: 65 % (ref 43–77)

## 2015-03-05 LAB — I-STAT CHEM 8, ED
BUN: 60 mg/dL — AB (ref 6–20)
CALCIUM ION: 1.14 mmol/L (ref 1.13–1.30)
CHLORIDE: 100 mmol/L — AB (ref 101–111)
Creatinine, Ser: 12.7 mg/dL — ABNORMAL HIGH (ref 0.61–1.24)
GLUCOSE: 85 mg/dL (ref 65–99)
HCT: 32 % — ABNORMAL LOW (ref 39.0–52.0)
Hemoglobin: 10.9 g/dL — ABNORMAL LOW (ref 13.0–17.0)
POTASSIUM: 3.7 mmol/L (ref 3.5–5.1)
Sodium: 139 mmol/L (ref 135–145)
TCO2: 24 mmol/L (ref 0–100)

## 2015-03-05 LAB — PROTIME-INR
INR: 1.07 (ref 0.00–1.49)
Prothrombin Time: 14.1 seconds (ref 11.6–15.2)

## 2015-03-05 LAB — I-STAT TROPONIN, ED: Troponin i, poc: 0.03 ng/mL (ref 0.00–0.08)

## 2015-03-05 LAB — APTT: aPTT: 23 seconds — ABNORMAL LOW (ref 24–37)

## 2015-03-05 LAB — CBG MONITORING, ED: GLUCOSE-CAPILLARY: 89 mg/dL (ref 65–99)

## 2015-03-05 MED ORDER — MECLIZINE HCL 25 MG PO TABS: 25.0000 mg | ORAL_TABLET | Freq: Three times a day (TID) | ORAL | Status: AC | PRN

## 2015-03-05 MED ORDER — MECLIZINE HCL 25 MG PO TABS
25.0000 mg | ORAL_TABLET | Freq: Once | ORAL | Status: AC
  Administered 2015-03-05: 25 mg via ORAL
  Filled 2015-03-05: qty 1

## 2015-03-05 NOTE — ED Notes (Signed)
Patient transported to MRI 

## 2015-03-05 NOTE — ED Notes (Signed)
CBG 89. Nurse notified.

## 2015-03-05 NOTE — Discharge Instructions (Signed)

## 2015-03-05 NOTE — ED Provider Notes (Signed)
CSN: [id number]     Arrival date & time 03/05/15  0413 History   First MD Initiated Contact with Patient 03/05/15 [phone]     Chief Complaint  Patient presents with  . Dizziness     (Consider location/radiation/quality/duration/timing/severity/associated sxs/prior Treatment) HPI Patient has a history of multiple myeloma for which she is receiving treatment. States he had chemotherapy on Friday. On Saturday the patient began having unsteady gait. He is able to ambulate but has difficulty. He denies any focal weakness. He has no ear ringing or hearing changes. There is been no changes in his vision. He denies any focal weakness or new numbness. Patient does have neuropathy to his right lower extremity. This is unchanged. He denies associated lightheadedness, nausea or vomiting. Symptoms are not worsened with head or body position changes. Past Medical History  Diagnosis Date  . Hypertension   . COPD (chronic obstructive pulmonary disease)   . Asthma   . Chronic kidney disease   . History of kidney stones   . History of hiatal hernia   . Headache     HISTORY OF CLUSTER HEADACHES  . Arthritis   . Complication of anesthesia     TROUBLE WAKING UP  . H/O colonoscopy with polypectomy   . Allergic rhinitis   . DVT (deep venous thrombosis)     RIGHT CALF AFTER A LITHOTRIPSY  . BPH (benign prostatic hypertrophy)   . Osteoarthritis   . Emphysema of lung    Past Surgical History  Procedure Laterality Date  . Lithotripsy    . Back surgery    . Arthroscopy right knee    . Cholecystectomy    . Skin cancer removed from ears    . Insertion of dialysis catheter Right 02/07/2015    Procedure: INSERTION OF DIALYSIS CATHETER;  Surgeon: Rosetta Posner, MD;  Location: [place];  Service: Vascular;  Laterality: Right;  . Av fistula placement Left 02/07/2015    Procedure: ARTERIOVENOUS (AV) FISTULA CREATION;  Surgeon: Rosetta Posner, MD;  Location: [place];  Service: Vascular;  Laterality: Left;  . Removal of a  dialysis catheter Left 02/07/2015    Procedure: Removal of  Temporary catheter in left neck;  Surgeon: Rosetta Posner, MD;  Location: [place];  Service: Vascular;  Laterality: Left;  . Back surgery      1969   Family History  Problem Relation Age of Onset  . Hypertension Mother   . Hypertension Father   . Cancer Brother   . Cancer Brother    History  Substance Use Topics  . Smoking status: Former Smoker -- 2.00 packs/day    Types: Cigarettes  . Smokeless tobacco: Never Used  . Alcohol Use: No    Review of Systems  Constitutional: Negative for fever and chills.  HENT: Negative for ear pain and hearing loss.   Eyes: Negative for visual disturbance.  Respiratory: Negative for cough and shortness of breath.   Cardiovascular: Negative for chest pain.  Gastrointestinal: Negative for nausea, vomiting and abdominal pain.  Musculoskeletal: Negative for myalgias, back pain, neck pain and neck stiffness.  Skin: Negative for rash and wound.  Neurological: Positive for dizziness. Negative for syncope, weakness, light-headedness, numbness and headaches.  All other systems reviewed and are negative.     Allergies  Review of patient's allergies indicates no known allergies.  Home Medications   Prior to Admission medications   Medication Sig Start Date End Date Taking? Authorizing Provider  acyclovir (ZOVIRAX) 400 MG tablet  Take 1 tablet (400 mg total) by mouth 2 (two) times daily. 02/21/15  Yes Curt Bears, MD  amLODipine (NORVASC) 5 MG tablet Take 5 mg by mouth daily.   Yes Historical Provider, MD  bisacodyl (DULCOLAX) 5 MG EC tablet Take 1 tablet (5 mg total) by mouth daily as needed for moderate constipation. 02/27/15  Yes Curt Bears, MD  budesonide-formoterol [facility]) 80-4.5 MCG/ACT inhaler Inhale 2 puffs into the lungs 2 (two) times daily.   Yes Historical Provider, MD  calcium carbonate (TUMS - DOSED IN MG ELEMENTAL CALCIUM) 500 MG chewable tablet Chew 1 tablet by mouth 3  (three) times daily.   Yes Historical Provider, MD  dexamethasone (DECADRON) 4 MG tablet Take 10 tablets (40 mg total) by mouth once a week. 03/02/15  Yes Curt Bears, MD  fluticasone (FLONASE) 50 MCG/ACT nasal spray Place 1 spray into both nostrils daily.   Yes Historical Provider, MD  omeprazole (PRILOSEC) 40 MG capsule Take 40 mg by mouth daily.   Yes Historical Provider, MD  prochlorperazine (COMPAZINE) 10 MG tablet Take 1 tablet (10 mg total) by mouth every 6 (six) hours as needed for nausea or vomiting. 02/23/15  Yes Adrena E Johnson, PA-C  meclizine (ANTIVERT) 25 MG tablet Take 1 tablet (25 mg total) by mouth 3 (three) times daily as needed for dizziness. 03/05/15   Julianne Rice, MD   BP 164/66 mmHg  Pulse 80  Temp(Src) 98.3 F (36.8 C) (Oral)  Resp 19  SpO2 97% Physical Exam  Constitutional: He is oriented to person, place, and time. He appears well-developed and well-nourished. No distress.  HENT:  Head: Normocephalic and atraumatic.  Mouth/Throat: Oropharynx is clear and moist. No oropharyngeal exudate.  Patient with hearing aids in both ears. Normal bilateral TMs.  Eyes: EOM are normal. Pupils are equal, round, and reactive to light.  Patient with fine nystagmus  Neck: Normal range of motion. Neck supple.  Cardiovascular: Normal rate and regular rhythm.   Pulmonary/Chest: Effort normal and breath sounds normal. No respiratory distress. He has no wheezes. He has no rales. He exhibits no tenderness.  Abdominal: Soft. Bowel sounds are normal. He exhibits no distension and no mass. There is no tenderness. There is no rebound and no guarding.  Musculoskeletal: Normal range of motion. He exhibits no edema or tenderness.  Neurological: He is alert and oriented to person, place, and time.  Patient is alert and oriented x3 with clear, goal oriented speech. Patient has 5/5 motor in all extremities. Sensation is intact to light touch. Bilateral finger-to-nose is normal with no signs of  dysmetria.   Skin: Skin is warm and dry. No rash noted. No erythema.  Psychiatric: He has a normal mood and affect. His behavior is normal.  Nursing note and vitals reviewed.   ED Course  Procedures (including critical care time) Labs Review Labs Reviewed  APTT - Abnormal; Notable for the following:    aPTT 23 (*)    All other components within normal limits  CBC - Abnormal; Notable for the following:    RBC 3.24 (*)    Hemoglobin 10.8 (*)    HCT 31.9 (*)    RDW 18.6 (*)    Platelets 97 (*)    All other components within normal limits  COMPREHENSIVE METABOLIC PANEL - Abnormal; Notable for the following:    Chloride 99 (*)    BUN 63 (*)    Creatinine, Ser 11.98 (*)    Total Protein 5.8 (*)    GFR  calc non Af Amer 3 (*)    GFR calc Af Amer 4 (*)    All other components within normal limits  I-STAT CHEM 8, ED - Abnormal; Notable for the following:    Chloride 100 (*)    BUN 60 (*)    Creatinine, Ser 12.70 (*)    Hemoglobin 10.9 (*)    HCT 32.0 (*)    All other components within normal limits  PROTIME-INR  DIFFERENTIAL  I-STAT TROPOININ, ED  CBG MONITORING, ED    Imaging Review Mr Brain Wo Contrast  03/05/2015   CLINICAL DATA:  Initial evaluation for acute dizziness, unsteady gait.  EXAM: MRI HEAD WITHOUT CONTRAST  TECHNIQUE: Multiplanar, multiecho pulse sequences of the brain and surrounding structures were obtained without intravenous contrast.  COMPARISON:  None available.  FINDINGS: Diffuse prominence of the CSF containing spaces is compatible with generalized cerebral atrophy. Patchy and confluent T2/FLAIR hyperintensity within the periventricular and deep white matter both cerebral hemispheres present, most likely related to chronic small vessel ischemic changes, fairly mild for patient age. Remote lacunar infarct present within the left basal ganglia  No acute intracranial infarct identified. Gray-white matter differentiation maintained. Dominant right vertebral artery  widely patent to the vertebrobasilar junction. There is diminished flow void within the diminutive left vertebral artery, which may reflect occlusion or slow flow. Intracranial flow voids otherwise preserved. No acute or chronic intracranial hemorrhage.  No mass lesion, mass effect, or midline shift. No hydrocephalus. No extra-axial fluid collection.  Craniocervical junction within normal limits. Mild multilevel degenerative spondylolysis present within the visualized upper cervical spine. Pituitary gland normal.  No acute abnormality about the orbits.  Mild mucosal thickening within the ethmoidal air cells. Paranasal sinuses are otherwise clear. No mastoid effusion. Inner ear structures within normal limits.  Bone marrow signal intensity is unremarkable. No scalp soft tissue abnormality.  IMPRESSION: 1. No acute intracranial infarct or other abnormality identified. 2. Absent flow void within the diminutive left vertebral artery, which may reflect slow flow or occlusion. The dominant right vertebral artery is widely patent to the vertebrobasilar junction as is the basilar artery. Remainder of the intravascular flow voids are maintained. 3. Generalized cerebral atrophy with mild chronic small vessel ischemic disease and remote left basal ganglia lacunar infarct.   Electronically Signed   By: Jeannine Boga M.D.   On: 03/05/2015 06:59     EKG Interpretation   Date/Time:  Monday March 05 2015 04:49:25 EDT Ventricular Rate:  90 PR Interval:  138 QRS Duration: 82 QT Interval:  370 QTC Calculation: 452 R Axis:   26 Text Interpretation:  Normal sinus rhythm with sinus arrhythmia Normal ECG  Confirmed by Atleigh Gruen  MD, Mada Sadik ([id number]) on 03/05/2015 5:58:03 AM      MDM   Final diagnoses:  Dizziness    We'll get MRI brain to rule out posterior circulation stroke.  No stroke evident on MRI. Patient does have reduced vertebral flow in the low on the left but normal right and basal flow. Discussed  with Dr. Aram Beecham. Thinks is unlikely contributing to his symptoms. Advised medical management. We'll give dose of meclizine in emergency Department and ambulate. The patient is a steady, he'll be discharged home to follow-up with his primary physician. Otherwise he will need admission  Signed out to oncoming emergency physician.  Julianne Rice, MD 03/06/15 [phone]

## 2015-03-05 NOTE — ED Notes (Signed)
Patient is here with complaint of dizziness. Explains that sensation started after chemo treatment on Friday; symptoms first noticed on Saturday. Eye movement exacerbates symptoms. No gross neuro deficits in triage. Patient ambulatory, but unsteady do to dizziness. Also describes night terrors.

## 2015-03-06 ENCOUNTER — Other Ambulatory Visit: Payer: Self-pay | Admitting: *Deleted

## 2015-03-08 ENCOUNTER — Other Ambulatory Visit: Payer: Self-pay | Admitting: *Deleted

## 2015-03-08 NOTE — Patient Outreach (Signed)
Follow up phone call: Spoke with spouse [name] ( on [facility] consent form)- check on status of pt, recent ED visit.  Spouse reports on reason for  ED visit - pt  could not walk,confused, head acting ugly.  Spouse reports  they did an  MRI, checked heart, did a lot of tests, told has vertigo.  Spouse states gave pt a rx for Antivert- to take three times a day.  Spouse inquired if pt feels better, can he cut back on the Antivert to which this RN CM after reviewing pt's med list from ED informed her med was ordered three times a day as needed.   Spouse voiced understanding.  Spouse states pt went to dialysis yesterday, took off more fluid than usual, feeling better today.  RN CM inquired of spouse if ED recommended pt f/u with his Primary Care MD to which she said pt already had an appointment scheduled for today.   Spouse states pt is compliant with a renal diet.  As discussed with spouse, plan to f/u again telephonically on 8/4.   Zara Chess.   [place] [facility] Management  [phone]

## 2015-03-09 ENCOUNTER — Other Ambulatory Visit (HOSPITAL_BASED_OUTPATIENT_CLINIC_OR_DEPARTMENT_OTHER): Payer: PPO

## 2015-03-09 ENCOUNTER — Ambulatory Visit (HOSPITAL_BASED_OUTPATIENT_CLINIC_OR_DEPARTMENT_OTHER): Payer: PPO

## 2015-03-09 ENCOUNTER — Telehealth: Payer: Self-pay | Admitting: Nurse Practitioner

## 2015-03-09 ENCOUNTER — Ambulatory Visit (HOSPITAL_BASED_OUTPATIENT_CLINIC_OR_DEPARTMENT_OTHER): Payer: PPO | Admitting: Nurse Practitioner

## 2015-03-09 VITALS — BP 143/57 | HR 87 | Temp 98.8°F | Wt 187.6 lb

## 2015-03-09 DIAGNOSIS — Z5112 Encounter for antineoplastic immunotherapy: Secondary | ICD-10-CM | POA: Diagnosis not present

## 2015-03-09 DIAGNOSIS — Z992 Dependence on renal dialysis: Secondary | ICD-10-CM | POA: Diagnosis not present

## 2015-03-09 DIAGNOSIS — N19 Unspecified kidney failure: Secondary | ICD-10-CM

## 2015-03-09 DIAGNOSIS — R42 Dizziness and giddiness: Secondary | ICD-10-CM

## 2015-03-09 DIAGNOSIS — C9000 Multiple myeloma not having achieved remission: Secondary | ICD-10-CM | POA: Diagnosis not present

## 2015-03-09 LAB — COMPREHENSIVE METABOLIC PANEL (CC13)
ALBUMIN: 3.3 g/dL — AB (ref 3.5–5.0)
ALT: 12 U/L (ref 0–55)
AST: 19 U/L (ref 5–34)
Alkaline Phosphatase: 73 U/L (ref 40–150)
Anion Gap: 10 mEq/L (ref 3–11)
BILIRUBIN TOTAL: 1.26 mg/dL — AB (ref 0.20–1.20)
BUN: 7.1 mg/dL (ref 7.0–26.0)
CO2: 33 meq/L — AB (ref 22–29)
CREATININE: 4 mg/dL — AB (ref 0.7–1.3)
Calcium: 8.1 mg/dL — ABNORMAL LOW (ref 8.4–10.4)
Chloride: 98 mEq/L (ref 98–109)
EGFR: 13 mL/min/{1.73_m2} — AB (ref >90)
Glucose: 168 mg/dl — ABNORMAL HIGH (ref 70–140)
Potassium: 3.4 mEq/L — ABNORMAL LOW (ref 3.5–5.1)
Sodium: 141 mEq/L (ref 136–145)
Total Protein: 6 g/dL — ABNORMAL LOW (ref 6.4–8.3)

## 2015-03-09 LAB — CBC WITH DIFFERENTIAL/PLATELET
BASO%: 1.3 % (ref 0.0–2.0)
BASOS ABS: 0 10*3/uL (ref 0.0–0.1)
EOS%: 2.8 % (ref 0.0–7.0)
Eosinophils Absolute: 0.1 10*3/uL (ref 0.0–0.5)
HEMATOCRIT: 31 % — AB (ref 38.4–49.9)
HEMOGLOBIN: 10.3 g/dL — AB (ref 13.0–17.1)
LYMPH%: 18.7 % (ref 14.0–49.0)
MCH: 33.3 pg (ref 27.2–33.4)
MCHC: 33.3 g/dL (ref 32.0–36.0)
MCV: 100.1 fL — AB (ref 79.3–98.0)
MONO#: 0.3 10*3/uL (ref 0.1–0.9)
MONO%: 8 % (ref 0.0–14.0)
NEUT%: 69.2 % (ref 39.0–75.0)
NEUTROS ABS: 2.6 10*3/uL (ref 1.5–6.5)
Platelets: 131 10*3/uL — ABNORMAL LOW (ref 140–400)
RBC: 3.1 10*6/uL — ABNORMAL LOW (ref 4.20–5.82)
RDW: 19.6 % — ABNORMAL HIGH (ref 11.0–14.6)
WBC: 3.8 10*3/uL — ABNORMAL LOW (ref 4.0–10.3)
lymph#: 0.7 10*3/uL — ABNORMAL LOW (ref 0.9–3.3)

## 2015-03-09 MED ORDER — ONDANSETRON HCL 8 MG PO TABS
8.0000 mg | ORAL_TABLET | Freq: Once | ORAL | Status: AC
  Administered 2015-03-09: 8 mg via ORAL

## 2015-03-09 MED ORDER — ONDANSETRON HCL 8 MG PO TABS
ORAL_TABLET | ORAL | Status: AC
  Filled 2015-03-09: qty 1

## 2015-03-09 MED ORDER — BORTEZOMIB CHEMO SQ INJECTION 3.5 MG (2.5MG/ML)
1.3000 mg/m2 | Freq: Once | INTRAMUSCULAR | Status: AC
  Administered 2015-03-09: 2.5 mg via SUBCUTANEOUS
  Filled 2015-03-09: qty 2.5

## 2015-03-09 NOTE — Telephone Encounter (Signed)
Pt confirmed labs/ov per 07/29 POF, gave pt AVS and Calendar.[name], sent msg to add Velcade and also sent msg to MD about seeing pt on 08/12 since MD isn't here on Friday's... KJ

## 2015-03-09 NOTE — Progress Notes (Signed)
  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  CURRENT THERAPY: 1) hemodialysis on Monday, Wednesday and Friday weekly under the care of [facility]. 2) weekly subcutaneous Velcade 1.3 MG/M2 in addition to Decadron 40 mg by mouth weekly.  Status post 4 cycles.   INTERVAL HISTORY:   Mr. Harriott returns as scheduled. He continues weekly Velcade/dexamethasone. He denies nausea/vomiting. No mouth sores. No diarrhea. He feels weak. He notes an alteration in taste. He continues dialysis Monday Wednesday Friday. He reports developing dizziness last weekend. He was evaluated in the emergency department on 03/05/2015. Brain MRI showed no acute intracranial infarct or other abnormality. He reports he also had a 2-D echo that was negative. His wife reports he was diagnosed with vertigo and started on Antivert. The dizziness is better. He continues to have intermittent dizziness.  Objective:  Vital signs in last 24 hours:  Blood pressure 143/57, pulse 87, temperature 98.8 F (37.1 C), temperature source Oral, weight 187 lb 9 oz (85.078 kg), SpO2 96 %.    HEENT: Pupils equal round and reactive to light. Extraocular movements intact. No thrush or ulcers. Mucous membranes are moist. Resp: Lungs clear bilaterally. Cardio: Regular rate and rhythm. GI: Abdomen soft and nontender. No hepatomegaly. Vascular: No leg edema. Neuro: Alert and oriented. Motor strength 5 over 5. Finger to nose intact.     Lab Results:  Lab Results  Component Value Date   WBC 3.8* 03/09/2015   HGB 10.3* 03/09/2015   HCT 31.0* 03/09/2015   MCV 100.1* 03/09/2015   PLT 131* 03/09/2015   NEUTROABS 2.6 03/09/2015    Imaging:  No results found.  Medications: I have reviewed the patient's current  medications.  Assessment/Plan: 1. 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 2. Renal failure. He continues hemodialysis on a Monday Wednesday Friday schedule. 3. Dizziness status post evaluation in the emergency room 03/05/2015. Improved on meclizine.   Disposition: Mr. Thorington appears stable. Plan to continue weekly Velcade/dexamethasone. He will receive cycle 5 today. He will return for a follow-up visit in 2 weeks. He will contact the office in the interim with any problems.  He will contact Dr. Deforest Hoyles with persistent dizziness.  Plan reviewed with Dr. Alen Blew.    Ned Card ANP/GNP-BC   03/09/2015  2:50 PM

## 2015-03-09 NOTE — Patient Instructions (Signed)
[facility] Discharge Instructions for Patients Receiving Chemotherapy  Today you received the following chemotherapy agents Velcade. To help prevent nausea and vomiting after your treatment, we encourage you to take your nausea medication as directed.  If you develop nausea and vomiting that is not controlled by your nausea medication, call the clinic.   BELOW ARE SYMPTOMS THAT SHOULD BE REPORTED IMMEDIATELY:  *FEVER GREATER THAN 100.5 F  *CHILLS WITH OR WITHOUT FEVER  NAUSEA AND VOMITING THAT IS NOT CONTROLLED WITH YOUR NAUSEA MEDICATION  *UNUSUAL SHORTNESS OF BREATH  *UNUSUAL BRUISING OR BLEEDING  TENDERNESS IN MOUTH AND THROAT WITH OR WITHOUT PRESENCE OF ULCERS  *URINARY PROBLEMS  *BOWEL PROBLEMS  UNUSUAL RASH Items with * indicate a potential emergency and should be followed up as soon as possible.  Feel free to call the clinic you have any questions or concerns. The clinic phone number is [phone].  Please show the CHEMO ALERT CARD at check-in to the Emergency Department and triage nurse.    

## 2015-03-14 ENCOUNTER — Telehealth: Payer: Self-pay | Admitting: Nurse Practitioner

## 2015-03-14 NOTE — Telephone Encounter (Signed)
Lft msg for pt confirming labs/infusion due to moving labs down.... Pt is to p/u updated schedule when he comes in... KJ

## 2015-03-15 ENCOUNTER — Other Ambulatory Visit: Payer: Self-pay | Admitting: *Deleted

## 2015-03-15 ENCOUNTER — Ambulatory Visit (HOSPITAL_COMMUNITY)
Admission: RE | Admit: 2015-03-15 | Discharge: 2015-03-15 | Disposition: A | Discharge status: Home / Self Care | Payer: PPO | Source: Ambulatory Visit | Attending: Vascular Surgery | Admitting: Vascular Surgery

## 2015-03-15 DIAGNOSIS — Z4931 Encounter for adequacy testing for hemodialysis: Secondary | ICD-10-CM | POA: Insufficient documentation

## 2015-03-15 DIAGNOSIS — N186 End stage renal disease: Secondary | ICD-10-CM | POA: Insufficient documentation

## 2015-03-15 NOTE — Patient Outreach (Signed)
Follow up phone call: Spoke with pt, check on status.  RN CM inquired about his vertigo to which pt states woke up this morning, had none.  Pt reports he has been taking Meclizine three times a day, might start cutting back on the medication.  Pt states saw Dr. Lysle Rubens, good report. Pt states appetite is getting a little better.  Pt reports continues with dialysis Monday, Wednesday,Friday.  Pt states he starts on Fridays starts dialysis early so he can have chemo done the same day in the afternoon.   Pt reports no problems with dialysis or chemo.   Discussed with pt f/u with a home visit 8/11 to which pt agreed but requested RN CM to call  the day before to make sure no other appointments.    Zara Chess.   [facility]  [phone]

## 2015-03-16 ENCOUNTER — Encounter (HOSPITAL_COMMUNITY): Payer: PPO

## 2015-03-16 ENCOUNTER — Ambulatory Visit (HOSPITAL_BASED_OUTPATIENT_CLINIC_OR_DEPARTMENT_OTHER): Payer: PPO

## 2015-03-16 ENCOUNTER — Other Ambulatory Visit (HOSPITAL_BASED_OUTPATIENT_CLINIC_OR_DEPARTMENT_OTHER): Payer: PPO

## 2015-03-16 VITALS — BP 142/62 | HR 84 | Temp 98.1°F | Resp 20

## 2015-03-16 DIAGNOSIS — C9000 Multiple myeloma not having achieved remission: Secondary | ICD-10-CM

## 2015-03-16 DIAGNOSIS — Z5112 Encounter for antineoplastic immunotherapy: Secondary | ICD-10-CM

## 2015-03-16 LAB — COMPREHENSIVE METABOLIC PANEL (CC13)
ALBUMIN: 3.4 g/dL — AB (ref 3.5–5.0)
ALT: 17 U/L (ref 0–55)
ANION GAP: 8 meq/L (ref 3–11)
AST: 18 U/L (ref 5–34)
Alkaline Phosphatase: 81 U/L (ref 40–150)
BILIRUBIN TOTAL: 1.16 mg/dL (ref 0.20–1.20)
BUN: 9.6 mg/dL (ref 7.0–26.0)
CALCIUM: 8.4 mg/dL (ref 8.4–10.4)
CO2: 29 meq/L (ref 22–29)
CREATININE: 3.8 mg/dL — AB (ref 0.7–1.3)
Chloride: 102 mEq/L (ref 98–109)
EGFR: 14 mL/min/{1.73_m2} — ABNORMAL LOW (ref >90)
GLUCOSE: 152 mg/dL — AB (ref 70–140)
Potassium: 3.9 mEq/L (ref 3.5–5.1)
Sodium: 139 mEq/L (ref 136–145)
Total Protein: 5.9 g/dL — ABNORMAL LOW (ref 6.4–8.3)

## 2015-03-16 LAB — CBC WITH DIFFERENTIAL/PLATELET
BASO%: 1.4 % (ref 0.0–2.0)
Basophils Absolute: 0.1 10*3/uL (ref 0.0–0.1)
EOS%: 2.2 % (ref 0.0–7.0)
Eosinophils Absolute: 0.1 10*3/uL (ref 0.0–0.5)
HCT: 30.6 % — ABNORMAL LOW (ref 38.4–49.9)
HGB: 10.4 g/dL — ABNORMAL LOW (ref 13.0–17.1)
LYMPH%: 14.9 % (ref 14.0–49.0)
MCH: 34.2 pg — ABNORMAL HIGH (ref 27.2–33.4)
MCHC: 34 g/dL (ref 32.0–36.0)
MCV: 100.8 fL — ABNORMAL HIGH (ref 79.3–98.0)
MONO#: 0.2 10*3/uL (ref 0.1–0.9)
MONO%: 4.9 % (ref 0.0–14.0)
NEUT#: 3.5 10*3/uL (ref 1.5–6.5)
NEUT%: 76.6 % — AB (ref 39.0–75.0)
Platelets: 149 10*3/uL (ref 140–400)
RBC: 3.04 10*6/uL — ABNORMAL LOW (ref 4.20–5.82)
RDW: 19.5 % — ABNORMAL HIGH (ref 11.0–14.6)
WBC: 4.6 10*3/uL (ref 4.0–10.3)
lymph#: 0.7 10*3/uL — ABNORMAL LOW (ref 0.9–3.3)

## 2015-03-16 MED ORDER — ONDANSETRON HCL 8 MG PO TABS
ORAL_TABLET | ORAL | Status: AC
  Filled 2015-03-16: qty 1

## 2015-03-16 MED ORDER — ONDANSETRON HCL 8 MG PO TABS
8.0000 mg | ORAL_TABLET | Freq: Once | ORAL | Status: AC
  Administered 2015-03-16: 8 mg via ORAL

## 2015-03-16 MED ORDER — BORTEZOMIB CHEMO SQ INJECTION 3.5 MG (2.5MG/ML)
1.3000 mg/m2 | Freq: Once | INTRAMUSCULAR | Status: AC
  Administered 2015-03-16: 2.5 mg via SUBCUTANEOUS
  Filled 2015-03-16: qty 2.5

## 2015-03-16 NOTE — Progress Notes (Signed)
Okay to tx per Ned Card NP with creatinine 3.8--pt on dialysis MWF.

## 2015-03-19 ENCOUNTER — Telehealth: Payer: Self-pay | Admitting: Internal Medicine

## 2015-03-19 NOTE — Telephone Encounter (Signed)
Spoke with patient wife today re changing lab/MM from 8/12 to 8/11 and keeping tx 8/12 due to dialysis conflict. Per wife dialysis center worked it out for Friday's dialysis to be done on Saturday. Appointments for 8/12 lab/MM/tx remain as originally scheduled for 8/12. Added tx to 8/19 and 8/26 appointments as tx is weekly. Patient/wife will get new schedule Friday.

## 2015-03-20 ENCOUNTER — Encounter: Payer: PPO | Admitting: Vascular Surgery

## 2015-03-21 ENCOUNTER — Other Ambulatory Visit: Payer: Self-pay | Admitting: *Deleted

## 2015-03-21 NOTE — Patient Outreach (Signed)
Telephone call:   Spoke with spouse [name]- on [facility] consent form), informed her pt requested a call today from RN CM  to confirm tomorrow's home visit to which spouse have nurse down for tomorrow.     Plan to f/u with pt on 8/10- home visit.      Zara Chess.   [facility]  [phone]

## 2015-03-22 ENCOUNTER — Other Ambulatory Visit: Payer: Self-pay | Admitting: *Deleted

## 2015-03-22 ENCOUNTER — Other Ambulatory Visit: Payer: PPO

## 2015-03-22 ENCOUNTER — Ambulatory Visit: Payer: PPO | Admitting: Internal Medicine

## 2015-03-23 ENCOUNTER — Telehealth: Payer: Self-pay | Admitting: Internal Medicine

## 2015-03-23 ENCOUNTER — Telehealth: Payer: Self-pay | Admitting: *Deleted

## 2015-03-23 ENCOUNTER — Other Ambulatory Visit: Payer: PPO

## 2015-03-23 ENCOUNTER — Ambulatory Visit (HOSPITAL_BASED_OUTPATIENT_CLINIC_OR_DEPARTMENT_OTHER): Payer: PPO | Admitting: Internal Medicine

## 2015-03-23 ENCOUNTER — Other Ambulatory Visit (HOSPITAL_BASED_OUTPATIENT_CLINIC_OR_DEPARTMENT_OTHER): Payer: PPO

## 2015-03-23 ENCOUNTER — Encounter: Payer: Self-pay | Admitting: Internal Medicine

## 2015-03-23 ENCOUNTER — Ambulatory Visit (HOSPITAL_BASED_OUTPATIENT_CLINIC_OR_DEPARTMENT_OTHER): Payer: PPO

## 2015-03-23 VITALS — BP 103/46 | HR 80 | Temp 98.2°F | Resp 18 | Ht 69.0 in | Wt 190.1 lb

## 2015-03-23 DIAGNOSIS — C9000 Multiple myeloma not having achieved remission: Secondary | ICD-10-CM

## 2015-03-23 DIAGNOSIS — Z992 Dependence on renal dialysis: Secondary | ICD-10-CM

## 2015-03-23 DIAGNOSIS — N19 Unspecified kidney failure: Secondary | ICD-10-CM

## 2015-03-23 DIAGNOSIS — N186 End stage renal disease: Secondary | ICD-10-CM

## 2015-03-23 DIAGNOSIS — Z5112 Encounter for antineoplastic immunotherapy: Secondary | ICD-10-CM | POA: Diagnosis not present

## 2015-03-23 DIAGNOSIS — N179 Acute kidney failure, unspecified: Secondary | ICD-10-CM

## 2015-03-23 LAB — COMPREHENSIVE METABOLIC PANEL (CC13)
ALK PHOS: 100 U/L (ref 40–150)
ALT: 13 U/L (ref 0–55)
AST: 15 U/L (ref 5–34)
Albumin: 3.3 g/dL — ABNORMAL LOW (ref 3.5–5.0)
Anion Gap: 13 mEq/L — ABNORMAL HIGH (ref 3–11)
BUN: 38.5 mg/dL — ABNORMAL HIGH (ref 7.0–26.0)
CO2: 24 mEq/L (ref 22–29)
Calcium: 8.6 mg/dL (ref 8.4–10.4)
Chloride: 104 mEq/L (ref 98–109)
Creatinine: 9.2 mg/dL (ref 0.7–1.3)
EGFR: 5 mL/min/{1.73_m2} — AB (ref >90)
Glucose: 116 mg/dl (ref 70–140)
Potassium: 4.1 mEq/L (ref 3.5–5.1)
SODIUM: 141 meq/L (ref 136–145)
TOTAL PROTEIN: 5.7 g/dL — AB (ref 6.4–8.3)
Total Bilirubin: 1 mg/dL (ref 0.20–1.20)

## 2015-03-23 LAB — CBC WITH DIFFERENTIAL/PLATELET
BASO%: 0.5 % (ref 0.0–2.0)
BASOS ABS: 0 10*3/uL (ref 0.0–0.1)
EOS ABS: 0.2 10*3/uL (ref 0.0–0.5)
EOS%: 2.7 % (ref 0.0–7.0)
HCT: 29.5 % — ABNORMAL LOW (ref 38.4–49.9)
HGB: 10 g/dL — ABNORMAL LOW (ref 13.0–17.1)
LYMPH%: 18.2 % (ref 14.0–49.0)
MCH: 34.1 pg — AB (ref 27.2–33.4)
MCHC: 33.9 g/dL (ref 32.0–36.0)
MCV: 100.7 fL — AB (ref 79.3–98.0)
MONO#: 0.4 10*3/uL (ref 0.1–0.9)
MONO%: 6.6 % (ref 0.0–14.0)
NEUT#: 4 10*3/uL (ref 1.5–6.5)
NEUT%: 72 % (ref 39.0–75.0)
Platelets: 147 10*3/uL (ref 140–400)
RBC: 2.93 10*6/uL — ABNORMAL LOW (ref 4.20–5.82)
RDW: 17.8 % — AB (ref 11.0–14.6)
WBC: 5.6 10*3/uL (ref 4.0–10.3)
lymph#: 1 10*3/uL (ref 0.9–3.3)

## 2015-03-23 MED ORDER — ONDANSETRON HCL 8 MG PO TABS
8.0000 mg | ORAL_TABLET | Freq: Once | ORAL | Status: AC
  Administered 2015-03-23: 8 mg via ORAL

## 2015-03-23 MED ORDER — BORTEZOMIB CHEMO SQ INJECTION 3.5 MG (2.5MG/ML)
1.3000 mg/m2 | Freq: Once | INTRAMUSCULAR | Status: AC
  Administered 2015-03-23: 2.5 mg via SUBCUTANEOUS
  Filled 2015-03-23: qty 2.5

## 2015-03-23 MED ORDER — ONDANSETRON HCL 8 MG PO TABS
ORAL_TABLET | ORAL | Status: AC
  Filled 2015-03-23: qty 1

## 2015-03-23 NOTE — Patient Instructions (Signed)
[facility] Discharge Instructions for Patients Receiving Chemotherapy  Today you received the following chemotherapy agents Velcade. To help prevent nausea and vomiting after your treatment, we encourage you to take your nausea medication as directed.  If you develop nausea and vomiting that is not controlled by your nausea medication, call the clinic.   BELOW ARE SYMPTOMS THAT SHOULD BE REPORTED IMMEDIATELY:  *FEVER GREATER THAN 100.5 F  *CHILLS WITH OR WITHOUT FEVER  NAUSEA AND VOMITING THAT IS NOT CONTROLLED WITH YOUR NAUSEA MEDICATION  *UNUSUAL SHORTNESS OF BREATH  *UNUSUAL BRUISING OR BLEEDING  TENDERNESS IN MOUTH AND THROAT WITH OR WITHOUT PRESENCE OF ULCERS  *URINARY PROBLEMS  *BOWEL PROBLEMS  UNUSUAL RASH Items with * indicate a potential emergency and should be followed up as soon as possible.  Feel free to call the clinic you have any questions or concerns. The clinic phone number is [phone].  Please show the CHEMO ALERT CARD at check-in to the Emergency Department and triage nurse.    

## 2015-03-23 NOTE — Progress Notes (Signed)
Verbal consent given by Dr. Julien Nordmann  To treat pt today with abnormal Creatinine and Bun

## 2015-03-23 NOTE — Progress Notes (Signed)
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  CURRENT THERAPY: 1) hemodialysis on Monday, Wednesday and Friday weekly under the care of [facility]. 2) weekly subcutaneous Velcade 1.3 MG/M2 in addition to Decadron 40 mg by mouth weekly. Status post 6 cycles.  INTERVAL HISTORY: Justin Woods 81 y.o. male returns to the clinic today for follow-up visit accompanied by his wife and daughter. The patient is tolerating his current treatment with subcutaneous Velcade and Decadron fairly well with no significant adverse effects. He continues his dialysis on Monday, Wednesday and Friday weekly. He has mild fatigue after the dialysis. He denied having any significant nausea or vomiting, no fever or chills. The patient denied having any significant chest pain, shortness breath, cough or hemoptysis. The patient his family has several questions about his prognosis and further treatment options in the future if needed. He is here today to start cycle #7 of his weekly subcutaneous Velcade.  MEDICAL HISTORY: Past Medical History  Diagnosis Date  . Hypertension   . COPD (chronic obstructive pulmonary disease)   . Asthma   . Chronic kidney disease   . History of kidney stones   . History of hiatal hernia   . Headache     HISTORY OF CLUSTER HEADACHES  . Arthritis   . Complication of anesthesia     TROUBLE WAKING UP  . H/O colonoscopy with polypectomy   . Allergic rhinitis   . DVT (deep venous thrombosis)     RIGHT CALF AFTER A LITHOTRIPSY  . BPH (benign prostatic hypertrophy)   . Osteoarthritis   . Emphysema of lung     ALLERGIES:  has No Known  Allergies.  MEDICATIONS:  Current Outpatient Prescriptions  Medication Sig Dispense Refill  . acyclovir (ZOVIRAX) 400 MG tablet Take 1 tablet (400 mg total) by mouth 2 (two) times daily. 60 tablet 3  . amLODipine (NORVASC) 5 MG tablet Take 5 mg by mouth daily.    . bisacodyl (DULCOLAX) 5 MG EC tablet Take 1 tablet (5 mg total) by mouth daily as needed for moderate constipation. 30 tablet 0  . budesonide-formoterol (SYMBICORT) 80-4.5 MCG/ACT inhaler Inhale 2 puffs into the lungs 2 (two) times daily.    . calcium carbonate (TUMS - DOSED IN MG ELEMENTAL CALCIUM) 500 MG chewable tablet Chew 1 tablet by mouth 3 (three) times daily.    Marland Kitchen dexamethasone (DECADRON) 4 MG tablet Take 10 tablets (40 mg total) by mouth once a week. 40 tablet 4  . fluticasone (FLONASE) 50 MCG/ACT nasal spray Place 1 spray into both nostrils daily.    . meclizine (ANTIVERT) 25 MG tablet Take 1 tablet (25 mg total) by mouth 3 (three) times daily as needed for dizziness. (Patient not taking: Reported on 03/22/2015) 30 tablet 0  . metoprolol succinate (TOPROL-XL) 25 MG 24 hr tablet Take 25 mg by mouth daily.    Marland Kitchen omeprazole (PRILOSEC) 40 MG capsule Take 40 mg by mouth daily.    . prochlorperazine (COMPAZINE) 10 MG tablet Take 1 tablet (10 mg total) by mouth every 6 (six) hours as needed for nausea or vomiting. (Patient not taking: Reported on 03/22/2015) 30 tablet 0   No  current facility-administered medications for this visit.    SURGICAL HISTORY:  Past Surgical History  Procedure Laterality Date  . Lithotripsy    . Back surgery    . Arthroscopy right knee    . Cholecystectomy    . Skin cancer removed from ears    . Insertion of dialysis catheter Right 02/07/2015    Procedure: INSERTION OF DIALYSIS CATHETER;  Surgeon: Rosetta Posner, MD;  Location: [place];  Service: Vascular;  Laterality: Right;  . Av fistula placement Left 02/07/2015    Procedure: ARTERIOVENOUS (AV) FISTULA CREATION;  Surgeon: Rosetta Posner, MD;  Location: [place];  Service: Vascular;  Laterality: Left;  . Removal of a dialysis catheter Left 02/07/2015    Procedure: Removal of  Temporary catheter in left neck;  Surgeon: Rosetta Posner, MD;  Location: [place];  Service: Vascular;  Laterality: Left;  . Back surgery      1969    REVIEW OF SYSTEMS:  Constitutional: positive for fatigue Eyes: negative Ears, nose, mouth, throat, and face: negative Respiratory: negative Cardiovascular: negative Gastrointestinal: negative Genitourinary:negative Integument/breast: negative Hematologic/lymphatic: negative Musculoskeletal:negative Neurological: negative Behavioral/Psych: negative Endocrine: negative Allergic/Immunologic: negative   PHYSICAL EXAMINATION: General appearance: alert, cooperative, fatigued and no distress Head: Normocephalic, without obvious abnormality, atraumatic Neck: no adenopathy, no JVD, supple, symmetrical, trachea midline and thyroid not enlarged, symmetric, no tenderness/mass/nodules Lymph nodes: Cervical, supraclavicular, and axillary nodes normal. Resp: clear to auscultation bilaterally Back: symmetric, no curvature. ROM normal. No CVA tenderness. Cardio: regular rate and rhythm, S1, S2 normal, no murmur, click, rub or gallop GI: soft, non-tender; bowel sounds normal; no masses,  no organomegaly Extremities: extremities normal, atraumatic, no cyanosis or edema Neurologic: Alert and oriented X 3, normal strength and tone. Normal symmetric reflexes. Normal coordination and gait  ECOG PERFORMANCE STATUS: 1 - Symptomatic but completely ambulatory  Blood pressure 103/46, pulse 80, temperature 98.2 F (36.8 C), temperature source Oral, resp. rate 18, height $RemoveBe'5\' 9"'eaOeFzOle$  (1.753 m), weight 190 lb 1.6 oz (86.229 kg), SpO2 98 %.  LABORATORY DATA: Lab Results  Component Value Date   WBC 5.6 03/23/2015   HGB 10.0* 03/23/2015   HCT 29.5* 03/23/2015   MCV 100.7* 03/23/2015   PLT 147 03/23/2015      Chemistry      Component Value  Date/Time   NA 141 03/23/2015 0800   NA 139 03/05/2015 0548   K 4.1 03/23/2015 0800   K 3.7 03/05/2015 0548   CL 100* 03/05/2015 0548   CO2 24 03/23/2015 0800   CO2 23 03/05/2015 0520   BUN 38.5* 03/23/2015 0800   BUN 60* 03/05/2015 0548   CREATININE 9.2* 03/23/2015 0800   CREATININE 12.70* 03/05/2015 0548      Component Value Date/Time   CALCIUM 8.6 03/23/2015 0800   CALCIUM 9.0 03/05/2015 0520   ALKPHOS 100 03/23/2015 0800   ALKPHOS 64 03/05/2015 0520   AST 15 03/23/2015 0800   AST 23 03/05/2015 0520   ALT 13 03/23/2015 0800   ALT 18 03/05/2015 0520   BILITOT 1.00 03/23/2015 0800   BILITOT 0.7 03/05/2015 0520       RADIOGRAPHIC STUDIES: Mr Brain Wo Contrast  03/05/2015   CLINICAL DATA:  Initial evaluation for acute dizziness, unsteady gait.  EXAM: MRI HEAD WITHOUT CONTRAST  TECHNIQUE: Multiplanar, multiecho pulse sequences of the brain and surrounding structures were obtained without intravenous contrast.  COMPARISON:  None available.  FINDINGS: Diffuse prominence of the CSF containing spaces is compatible with generalized  cerebral atrophy. Patchy and confluent T2/FLAIR hyperintensity within the periventricular and deep white matter both cerebral hemispheres present, most likely related to chronic small vessel ischemic changes, fairly mild for patient age. Remote lacunar infarct present within the left basal ganglia  No acute intracranial infarct identified. Gray-white matter differentiation maintained. Dominant right vertebral artery widely patent to the vertebrobasilar junction. There is diminished flow void within the diminutive left vertebral artery, which may reflect occlusion or slow flow. Intracranial flow voids otherwise preserved. No acute or chronic intracranial hemorrhage.  No mass lesion, mass effect, or midline shift. No hydrocephalus. No extra-axial fluid collection.  Craniocervical junction within normal limits. Mild multilevel degenerative spondylolysis present within  the visualized upper cervical spine. Pituitary gland normal.  No acute abnormality about the orbits.  Mild mucosal thickening within the ethmoidal air cells. Paranasal sinuses are otherwise clear. No mastoid effusion. Inner ear structures within normal limits.  Bone marrow signal intensity is unremarkable. No scalp soft tissue abnormality.  IMPRESSION: 1. No acute intracranial infarct or other abnormality identified. 2. Absent flow void within the diminutive left vertebral artery, which may reflect slow flow or occlusion. The dominant right vertebral artery is widely patent to the vertebrobasilar junction as is the basilar artery. Remainder of the intravascular flow voids are maintained. 3. Generalized cerebral atrophy with mild chronic small vessel ischemic disease and remote left basal ganglia lacunar infarct.   Electronically Signed   By: Jeannine Boga M.D.   On: 03/05/2015 06:59    ASSESSMENT AND PLAN: This is a very pleasant 81 years old white male recently diagnosed with multiple myeloma as well as kappa light chain nephropathy. The patient is currently undergoing hemodialysis on Monday Wednesday and Friday. He is currently on treatment with subcutaneous Velcade and Decadron and tolerating it well. Status post 6 cycles. I recommended for the patient to proceed with his treatment as scheduled. He will start cycle #7 today. He would come back for follow-up visit in 3 weeks for reevaluation and management of any adverse effect of his treatment. The patient his family had several questions about his current condition and prognosis as well as future treatment options and I answered them completely to their satisfaction. They understand that he has incurable condition and also treatment is off palliative nature. The patient was advised to call immediately if he has any concerning symptoms in the interval. The patient voices understanding of current disease status and treatment options and is in  agreement with the current care plan.  All questions were answered. The patient knows to call the clinic with any problems, questions or concerns. We can certainly see the patient much sooner if necessary.  I spent 20 minutes counseling the patient face to face. The total time spent in the appointment was 30 minutes.  Disclaimer: This note was dictated with voice recognition software. Similar sounding words can inadvertently be transcribed and may not be corrected upon review.

## 2015-03-23 NOTE — Telephone Encounter (Signed)
Per staff message and POF I have scheduled appts. Advised scheduler of appts. JMW  

## 2015-03-23 NOTE — Telephone Encounter (Signed)
Pt confirmed labs/ov per 08/12 POF, gave pt AVS and Calendar.[name], sent msg to add chemo

## 2015-03-24 ENCOUNTER — Encounter: Payer: Self-pay | Admitting: *Deleted

## 2015-03-24 NOTE — Patient Outreach (Signed)
[facility]) Care Management   03/24/2015  Justin Woods 09/20/1930 [id number]  Justin Woods is an 81 y.o. male  Subjective:  Pt states to have labs/see Dr. Earlie Server tomorrow.  Pt states has good/bad days.  Pt states dialysis is Saturday, just this week.  Pt states saw Kidney MD, also had ultrasound of fistula last week.    Pt states he does not sleep good, MD aware.     Objective:   Filed Vitals:   03/22/15 1355  BP: 118/60  Pulse: 78  Resp: 20    ROS  Physical Exam  Constitutional: He is oriented to person, place, and time. He appears well-developed and well-nourished.  Cardiovascular: Normal rate and regular rhythm.   Respiratory: Breath sounds normal.  GI: Soft.  Musculoskeletal: Normal range of motion.  Neurological: He is alert and oriented to person, place, and time.  Psychiatric: He has a normal mood and affect. His behavior is normal. Judgment and thought content normal.  Site around fistula on right lower arm intact.   Current Medications: Current Outpatient Prescriptions  Medication Sig Dispense Refill  . acyclovir (ZOVIRAX) 400 MG tablet Take 1 tablet (400 mg total) by mouth 2 (two) times daily. 60 tablet 3  . amLODipine (NORVASC) 5 MG tablet Take 5 mg by mouth daily.    . bisacodyl (DULCOLAX) 5 MG EC tablet Take 1 tablet (5 mg total) by mouth daily as needed for moderate constipation. 30 tablet 0  . budesonide-formoterol (SYMBICORT) 80-4.5 MCG/ACT inhaler Inhale 2 puffs into the lungs 2 (two) times daily.    . calcium carbonate (TUMS - DOSED IN MG ELEMENTAL CALCIUM) 500 MG chewable tablet Chew 1 tablet by mouth 3 (three) times daily.    Marland Kitchen dexamethasone (DECADRON) 4 MG tablet Take 10 tablets (40 mg total) by mouth once a week. 40 tablet 4  . fluticasone (FLONASE) 50 MCG/ACT nasal spray Place 1 spray into both nostrils daily.    . metoprolol succinate (TOPROL-XL) 25 MG 24 hr tablet Take 25 mg by mouth daily.    Marland Kitchen omeprazole (PRILOSEC) 40 MG  capsule Take 40 mg by mouth daily.    . meclizine (ANTIVERT) 25 MG tablet Take 1 tablet (25 mg total) by mouth 3 (three) times daily as needed for dizziness. (Patient not taking: Reported on 03/22/2015) 30 tablet 0  . prochlorperazine (COMPAZINE) 10 MG tablet Take 1 tablet (10 mg total) by mouth every 6 (six) hours as needed for nausea or vomiting. (Patient not taking: Reported on 03/22/2015) 30 tablet 0   No current facility-administered medications for this visit.    Functional Status:   In your present state of health, do you have any difficulty performing the following activities: 03/01/2015 01/31/2015  Hearing? Justin Woods  Vision? Y N  Difficulty concentrating or making decisions? N N  Walking or climbing stairs? N N  Dressing or bathing? N N  Doing errands, shopping? N -  Preparing Food and eating ? N -  Using the Toilet? N -  In the past six months, have you accidently leaked urine? N -  Do you have problems with loss of bowel control? N -  Managing your Medications? N -  Managing your Finances? N -  Housekeeping or managing your Housekeeping? Y -    Fall/Depression Screening:    PHQ 2/9 Scores 03/01/2015  PHQ - 2 Score 0    Assessment:   Renal failure-  no c/o pain, reports feels good today.  Pt continues to go to dialysis Monday,                          Wednesday, Friday.   Pt to go to dialysis Saturday this week only due to MD appointment/labs/                          Chemo.                              Multiple Myeloma-  Pt continues with chemo once a week, reports to get 2-3 more treatments.                          Nutrition- pt maintaining nutrition, to explore more food options for Renal diet.                             Plan:   As discussed, pt to ask MD at tomorrow's visit about medication to help with sleep.            Pt to continue with Renal diet, explore more food options.             As discussed with pt/spouse, plan to discharge pt from RN CM services as transition  of care program                Completed/goals met.              RN CM  to send Dr. Lysle Rubens letter via fax in [place]-  inform of case closure.             RN CM  To inform Almon Register CMA to close case.                          [facility] CM Care Plan Problem One        Patient Outreach from 03/22/2015 in [place] Problem One  Recent hospital discharge -  ARF, multiple [place] for Problem One  Active   [facility] Long Term Goal (31-90 days)  Pt would not readmit in 31 days post day of discharge    [facility] Long Term Goal Start Date  03/01/15   [facility] Long Term Goal Met Date  03/22/15   Interventions for Problem One Long Term Goal  Discussed with pt importance of nutrition post discharge, rest, f/u with MD    [facility] CM Short Term Goal #1 (0-30 days)  Pt would continue to maintain nutritious diet in the next 30 days    [facility] CM Short Term Goal #1 Start Date  03/01/15   [facility] CM Short Term Goal #1 Met Date  03/22/15   Interventions for Short Term Goal #1  Reviewed with pt importance of nutrition after a hospital discharge, when receiving dialysis, chemo.         Justin Woods.   [facility]  [phone]

## 2015-03-26 ENCOUNTER — Telehealth: Payer: Self-pay | Admitting: Internal Medicine

## 2015-03-26 ENCOUNTER — Encounter: Payer: Self-pay | Admitting: Vascular Surgery

## 2015-03-26 NOTE — Telephone Encounter (Signed)
Pt's wife came in to see about getting referral for pt for Neurologist, I called their office at [facility] once they receive the referral they will contact pt with scheduling with Dr. Posey Pronto.... Sent msg to Dr. Julien Nordmann about putting order in for referral for Neurologist.. KJ

## 2015-03-27 ENCOUNTER — Encounter: Payer: Self-pay | Admitting: Vascular Surgery

## 2015-03-27 ENCOUNTER — Ambulatory Visit (INDEPENDENT_AMBULATORY_CARE_PROVIDER_SITE_OTHER): Payer: Self-pay | Admitting: Vascular Surgery

## 2015-03-27 ENCOUNTER — Telehealth: Payer: Self-pay | Admitting: Internal Medicine

## 2015-03-27 ENCOUNTER — Other Ambulatory Visit: Payer: Self-pay | Admitting: Internal Medicine

## 2015-03-27 VITALS — BP 122/68 | HR 70 | Temp 98.2°F | Resp 16 | Ht 69.0 in | Wt 188.0 lb

## 2015-03-27 DIAGNOSIS — C9000 Multiple myeloma not having achieved remission: Secondary | ICD-10-CM

## 2015-03-27 DIAGNOSIS — N186 End stage renal disease: Secondary | ICD-10-CM

## 2015-03-27 DIAGNOSIS — N179 Acute kidney failure, unspecified: Secondary | ICD-10-CM

## 2015-03-27 NOTE — Telephone Encounter (Signed)
S/w staff at [facility] Neurologist will contact pt with referral from the workqueue... lft msg for pt confirming that the Neurologist office will call him to set up the referral... KJ

## 2015-03-27 NOTE — Progress Notes (Signed)
Patient presents today for follow-up of right IJ catheter placement and left radiocephalic fistula creation by myself on 02/07/2015. This was done during an acute admission for renal failure. He is here today with his wife. He is having a somewhat difficult time adjusting to his new lifestyle. He had been quite active at his age of 28. He is having no difficulty with his catheter function.  On physical exam his left wrist incision is completely healed. He has excellent early maturation with an excellent thrill throughout the forearm up to the antecubital space and is radiocephalic fistula. He has good size maturation. I do not see any evidence of competing branches by physical exam.  Plan a good early maturation of fistula created on 02/07/2015. Would wait a total of 3 months if possible prior to using this for access. Explained that once this is been used on several dialysis sessions that we would remove his catheter at [facility] hospital in short stay.

## 2015-03-27 NOTE — Telephone Encounter (Signed)
Faxed pt's medical records to [facility] [phone].

## 2015-03-30 ENCOUNTER — Other Ambulatory Visit (HOSPITAL_BASED_OUTPATIENT_CLINIC_OR_DEPARTMENT_OTHER): Payer: PPO

## 2015-03-30 ENCOUNTER — Ambulatory Visit (HOSPITAL_BASED_OUTPATIENT_CLINIC_OR_DEPARTMENT_OTHER): Payer: PPO

## 2015-03-30 ENCOUNTER — Ambulatory Visit: Payer: PPO | Admitting: Physician Assistant

## 2015-03-30 VITALS — BP 114/50 | HR 77 | Temp 97.0°F

## 2015-03-30 DIAGNOSIS — C9000 Multiple myeloma not having achieved remission: Secondary | ICD-10-CM

## 2015-03-30 DIAGNOSIS — Z5112 Encounter for antineoplastic immunotherapy: Secondary | ICD-10-CM

## 2015-03-30 LAB — CBC WITH DIFFERENTIAL/PLATELET
BASO%: 1 % (ref 0.0–2.0)
BASOS ABS: 0.1 10*3/uL (ref 0.0–0.1)
EOS%: 2.6 % (ref 0.0–7.0)
Eosinophils Absolute: 0.1 10*3/uL (ref 0.0–0.5)
HEMATOCRIT: 30.1 % — AB (ref 38.4–49.9)
HGB: 10.3 g/dL — ABNORMAL LOW (ref 13.0–17.1)
LYMPH%: 20.4 % (ref 14.0–49.0)
MCH: 34.6 pg — AB (ref 27.2–33.4)
MCHC: 34.1 g/dL (ref 32.0–36.0)
MCV: 101.5 fL — ABNORMAL HIGH (ref 79.3–98.0)
MONO#: 0.8 10*3/uL (ref 0.1–0.9)
MONO%: 14.8 % — AB (ref 0.0–14.0)
NEUT#: 3.3 10*3/uL (ref 1.5–6.5)
NEUT%: 61.2 % (ref 39.0–75.0)
Platelets: 145 10*3/uL (ref 140–400)
RBC: 2.96 10*6/uL — ABNORMAL LOW (ref 4.20–5.82)
RDW: 19 % — ABNORMAL HIGH (ref 11.0–14.6)
WBC: 5.5 10*3/uL (ref 4.0–10.3)
lymph#: 1.1 10*3/uL (ref 0.9–3.3)

## 2015-03-30 LAB — COMPREHENSIVE METABOLIC PANEL (CC13)
ALT: 16 U/L (ref 0–55)
AST: 18 U/L (ref 5–34)
Albumin: 3.5 g/dL (ref 3.5–5.0)
Alkaline Phosphatase: 92 U/L (ref 40–150)
Anion Gap: 13 mEq/L — ABNORMAL HIGH (ref 3–11)
BUN: 8.1 mg/dL (ref 7.0–26.0)
CO2: 26 mEq/L (ref 22–29)
CREATININE: 3.5 mg/dL — AB (ref 0.7–1.3)
Calcium: 8.6 mg/dL (ref 8.4–10.4)
Chloride: 102 mEq/L (ref 98–109)
EGFR: 15 mL/min/{1.73_m2} — ABNORMAL LOW (ref >90)
GLUCOSE: 112 mg/dL (ref 70–140)
POTASSIUM: 3.4 meq/L — AB (ref 3.5–5.1)
SODIUM: 140 meq/L (ref 136–145)
Total Bilirubin: 1.09 mg/dL (ref 0.20–1.20)
Total Protein: 6 g/dL — ABNORMAL LOW (ref 6.4–8.3)

## 2015-03-30 MED ORDER — BORTEZOMIB CHEMO SQ INJECTION 3.5 MG (2.5MG/ML)
1.3000 mg/m2 | Freq: Once | INTRAMUSCULAR | Status: AC
  Administered 2015-03-30: 2.5 mg via SUBCUTANEOUS
  Filled 2015-03-30: qty 2.5

## 2015-03-30 MED ORDER — ONDANSETRON HCL 8 MG PO TABS
8.0000 mg | ORAL_TABLET | Freq: Once | ORAL | Status: AC
  Administered 2015-03-30: 8 mg via ORAL

## 2015-03-30 MED ORDER — ONDANSETRON HCL 8 MG PO TABS
ORAL_TABLET | ORAL | Status: AC
  Filled 2015-03-30: qty 1

## 2015-03-30 NOTE — Patient Instructions (Signed)
[facility] Discharge Instructions for Patients Receiving Chemotherapy  Today you received the following chemotherapy agents velcade.  To help prevent nausea and vomiting after your treatment, we encourage you to take your nausea medication .   If you develop nausea and vomiting that is not controlled by your nausea medication, call the clinic.   BELOW ARE SYMPTOMS THAT SHOULD BE REPORTED IMMEDIATELY:  *FEVER GREATER THAN 100.5 F  *CHILLS WITH OR WITHOUT FEVER  NAUSEA AND VOMITING THAT IS NOT CONTROLLED WITH YOUR NAUSEA MEDICATION  *UNUSUAL SHORTNESS OF BREATH  *UNUSUAL BRUISING OR BLEEDING  TENDERNESS IN MOUTH AND THROAT WITH OR WITHOUT PRESENCE OF ULCERS  *URINARY PROBLEMS  *BOWEL PROBLEMS  UNUSUAL RASH Items with * indicate a potential emergency and should be followed up as soon as possible.  Feel free to call the clinic you have any questions or concerns. The clinic phone number is [phone].  Please show the CHEMO ALERT CARD at check-in to the Emergency Department and triage nurse.   

## 2015-03-30 NOTE — Progress Notes (Signed)
Ok to treat with creatinine 3.5 per Dr. Marin Olp.

## 2015-04-05 ENCOUNTER — Telehealth: Payer: Self-pay | Admitting: Internal Medicine

## 2015-04-05 NOTE — Telephone Encounter (Signed)
s.w. pt and r/s appt due to dialysis....pt ok and aware

## 2015-04-06 ENCOUNTER — Other Ambulatory Visit: Payer: PPO

## 2015-04-06 ENCOUNTER — Ambulatory Visit: Payer: PPO | Admitting: Physician Assistant

## 2015-04-06 ENCOUNTER — Other Ambulatory Visit (HOSPITAL_BASED_OUTPATIENT_CLINIC_OR_DEPARTMENT_OTHER): Payer: PPO

## 2015-04-06 ENCOUNTER — Ambulatory Visit (HOSPITAL_BASED_OUTPATIENT_CLINIC_OR_DEPARTMENT_OTHER): Payer: PPO

## 2015-04-06 ENCOUNTER — Ambulatory Visit: Payer: PPO

## 2015-04-06 VITALS — BP 152/59 | HR 65 | Temp 97.5°F | Resp 18

## 2015-04-06 DIAGNOSIS — Z5112 Encounter for antineoplastic immunotherapy: Secondary | ICD-10-CM

## 2015-04-06 DIAGNOSIS — C9000 Multiple myeloma not having achieved remission: Secondary | ICD-10-CM

## 2015-04-06 LAB — COMPREHENSIVE METABOLIC PANEL (CC13)
ALK PHOS: 83 U/L (ref 40–150)
ALT: 13 U/L (ref 0–55)
AST: 19 U/L (ref 5–34)
Albumin: 3.5 g/dL (ref 3.5–5.0)
Anion Gap: 10 mEq/L (ref 3–11)
BILIRUBIN TOTAL: 1.26 mg/dL — AB (ref 0.20–1.20)
BUN: 10.7 mg/dL (ref 7.0–26.0)
CALCIUM: 8.6 mg/dL (ref 8.4–10.4)
CHLORIDE: 100 meq/L (ref 98–109)
CO2: 29 mEq/L (ref 22–29)
CREATININE: 4.5 mg/dL — AB (ref 0.7–1.3)
EGFR: 11 mL/min/{1.73_m2} — ABNORMAL LOW (ref >90)
GLUCOSE: 119 mg/dL (ref 70–140)
POTASSIUM: 3.3 meq/L — AB (ref 3.5–5.1)
SODIUM: 140 meq/L (ref 136–145)
Total Protein: 6.1 g/dL — ABNORMAL LOW (ref 6.4–8.3)

## 2015-04-06 LAB — CBC WITH DIFFERENTIAL/PLATELET
BASO%: 1.1 % (ref 0.0–2.0)
BASOS ABS: 0 10*3/uL (ref 0.0–0.1)
EOS%: 3.2 % (ref 0.0–7.0)
Eosinophils Absolute: 0.1 10*3/uL (ref 0.0–0.5)
HCT: 31 % — ABNORMAL LOW (ref 38.4–49.9)
HGB: 10.6 g/dL — ABNORMAL LOW (ref 13.0–17.1)
LYMPH%: 22.1 % (ref 14.0–49.0)
MCH: 35.2 pg — AB (ref 27.2–33.4)
MCHC: 34 g/dL (ref 32.0–36.0)
MCV: 103.6 fL — ABNORMAL HIGH (ref 79.3–98.0)
MONO#: 0.5 10*3/uL (ref 0.1–0.9)
MONO%: 13.8 % (ref 0.0–14.0)
NEUT#: 2.4 10*3/uL (ref 1.5–6.5)
NEUT%: 59.8 % (ref 39.0–75.0)
Platelets: 139 10*3/uL — ABNORMAL LOW (ref 140–400)
RBC: 3 10*6/uL — ABNORMAL LOW (ref 4.20–5.82)
RDW: 19.2 % — ABNORMAL HIGH (ref 11.0–14.6)
WBC: 3.9 10*3/uL — ABNORMAL LOW (ref 4.0–10.3)
lymph#: 0.9 10*3/uL (ref 0.9–3.3)

## 2015-04-06 MED ORDER — ONDANSETRON HCL 8 MG PO TABS
ORAL_TABLET | ORAL | Status: AC
  Filled 2015-04-06: qty 1

## 2015-04-06 MED ORDER — ONDANSETRON HCL 8 MG PO TABS
8.0000 mg | ORAL_TABLET | Freq: Once | ORAL | Status: AC
  Administered 2015-04-06: 8 mg via ORAL

## 2015-04-06 MED ORDER — BORTEZOMIB CHEMO SQ INJECTION 3.5 MG (2.5MG/ML)
1.3000 mg/m2 | Freq: Once | INTRAMUSCULAR | Status: AC
  Administered 2015-04-06: 2.5 mg via SUBCUTANEOUS
  Filled 2015-04-06: qty 2.5

## 2015-04-06 NOTE — Progress Notes (Signed)
Critical creatinine level noted, pt is receiving dialysis every MWF.  Pt's wife asks if he needs to see PA on 9/2 if he is scheduled to see Dr. Julien Nordmann on 04/19/15. Left message for Dr. Worthy Flank nurse to find out and call pt with plan.

## 2015-04-06 NOTE — Patient Instructions (Signed)
[facility] Discharge Instructions for Patients Receiving Chemotherapy  Today you received the following chemotherapy agents: Velcade.  To help prevent nausea and vomiting after your treatment, we encourage you to take your nausea medication: Compazine. Take one every 6 hours as needed.   If you develop nausea and vomiting that is not controlled by your nausea medication, call the clinic.   BELOW ARE SYMPTOMS THAT SHOULD BE REPORTED IMMEDIATELY:  *FEVER GREATER THAN 100.5 F  *CHILLS WITH OR WITHOUT FEVER  NAUSEA AND VOMITING THAT IS NOT CONTROLLED WITH YOUR NAUSEA MEDICATION  *UNUSUAL SHORTNESS OF BREATH  *UNUSUAL BRUISING OR BLEEDING  TENDERNESS IN MOUTH AND THROAT WITH OR WITHOUT PRESENCE OF ULCERS  *URINARY PROBLEMS  *BOWEL PROBLEMS  UNUSUAL RASH Items with * indicate a potential emergency and should be followed up as soon as possible.  Feel free to call the clinic should you have any questions or concerns. The clinic phone number is [phone].  Please show the CHEMO ALERT CARD at check-in to the Emergency Department and triage nurse.   

## 2015-04-09 ENCOUNTER — Telehealth: Payer: Self-pay | Admitting: *Deleted

## 2015-04-09 ENCOUNTER — Telehealth: Payer: Self-pay | Admitting: Medical Oncology

## 2015-04-09 NOTE — Telephone Encounter (Signed)
Spouse called.  "He hasn't been feeling good this morning.  Kind of feels strange.  He just feels sick.  He does have dialysis at 12:00 today at the center on [address].  However he has an appointment October 6th with [facility] neurology.  What do you advise we do?  Should he see someone now or get an earlier appointment with the [facility] neurology or go to our Medical doctor.  I feel it is something someone in your area needs to take a look at." [phone] called [phone].  Message left requesting a return call before 4:30 pm with more details of what signs or symptoms he is having to determine who need to evaluate further.  Advised that they call 911 or proceed to the nearest ED if he is in medical distress.

## 2015-04-09 NOTE — Telephone Encounter (Signed)
Wife asking if pt needs f/u on 9/2 and 9/8. Note to [facility] and I left message to wife that I will let her know wed.

## 2015-04-10 NOTE — Telephone Encounter (Addendum)
Per Julien Nordmann I told wife he does not need to come in this week but keep appt next week. She is concerned because " he does not feel right in the head , his head feels strange " . His neurology appt is oct 6th with [facility] ( Dr. Berlinda Last). He does feel a little better today after dialysis yesterday. I instructed wife to take pt to ED if symptoms worsen.

## 2015-04-12 NOTE — Telephone Encounter (Signed)
Called to F/u on call earlier this week.  [name] reports "Justin Woods has not been sleeping well.  He gets no rest at all on Friday nights.  He wakes up at 0200 and just sits in a recliner.  Now it's most nights he can't rest.  I gave him a tylenol to try to help him feel better.  Had a bad day on Monday but he's feeling better now."  This nurse explained Friday he takes dexamethasone 40 mg and this may be the cause of Friday's troubles.  Asked the effect of benadryl on his system but [name] says he hasn't taken benadryl in a long time.  Next scheduled F/U is tomorrow with [name] PA-C.  They will ask for advice on sleep aid tomorrow.

## 2015-04-13 ENCOUNTER — Encounter: Payer: Self-pay | Admitting: Physician Assistant

## 2015-04-13 ENCOUNTER — Ambulatory Visit (HOSPITAL_BASED_OUTPATIENT_CLINIC_OR_DEPARTMENT_OTHER): Payer: PPO | Admitting: Physician Assistant

## 2015-04-13 ENCOUNTER — Ambulatory Visit (HOSPITAL_BASED_OUTPATIENT_CLINIC_OR_DEPARTMENT_OTHER): Payer: PPO

## 2015-04-13 ENCOUNTER — Other Ambulatory Visit (HOSPITAL_BASED_OUTPATIENT_CLINIC_OR_DEPARTMENT_OTHER): Payer: PPO

## 2015-04-13 VITALS — BP 148/51 | HR 72 | Temp 98.5°F | Resp 18 | Ht 69.0 in | Wt 187.9 lb

## 2015-04-13 DIAGNOSIS — Z5112 Encounter for antineoplastic immunotherapy: Secondary | ICD-10-CM

## 2015-04-13 DIAGNOSIS — N186 End stage renal disease: Secondary | ICD-10-CM

## 2015-04-13 DIAGNOSIS — C9000 Multiple myeloma not having achieved remission: Secondary | ICD-10-CM | POA: Diagnosis not present

## 2015-04-13 DIAGNOSIS — N179 Acute kidney failure, unspecified: Secondary | ICD-10-CM

## 2015-04-13 DIAGNOSIS — Z992 Dependence on renal dialysis: Secondary | ICD-10-CM

## 2015-04-13 DIAGNOSIS — N19 Unspecified kidney failure: Secondary | ICD-10-CM

## 2015-04-13 LAB — LACTATE DEHYDROGENASE (CC13): LDH: 263 U/L — ABNORMAL HIGH (ref 125–245)

## 2015-04-13 LAB — COMPREHENSIVE METABOLIC PANEL (CC13)
ALT: 12 U/L (ref 0–55)
AST: 21 U/L (ref 5–34)
Albumin: 3.5 g/dL (ref 3.5–5.0)
Alkaline Phosphatase: 81 U/L (ref 40–150)
Anion Gap: 9 mEq/L (ref 3–11)
BUN: 8.5 mg/dL (ref 7.0–26.0)
CALCIUM: 8.6 mg/dL (ref 8.4–10.4)
CHLORIDE: 102 meq/L (ref 98–109)
CO2: 30 meq/L — AB (ref 22–29)
CREATININE: 4 mg/dL — AB (ref 0.7–1.3)
EGFR: 13 mL/min/{1.73_m2} — ABNORMAL LOW (ref >90)
Glucose: 119 mg/dl (ref 70–140)
Potassium: 3.4 mEq/L — ABNORMAL LOW (ref 3.5–5.1)
Sodium: 141 mEq/L (ref 136–145)
TOTAL PROTEIN: 6 g/dL — AB (ref 6.4–8.3)
Total Bilirubin: 1.33 mg/dL — ABNORMAL HIGH (ref 0.20–1.20)

## 2015-04-13 LAB — CBC WITH DIFFERENTIAL/PLATELET
BASO%: 0.6 % (ref 0.0–2.0)
BASOS ABS: 0 10*3/uL (ref 0.0–0.1)
EOS ABS: 0.1 10*3/uL (ref 0.0–0.5)
EOS%: 2.5 % (ref 0.0–7.0)
HCT: 32.1 % — ABNORMAL LOW (ref 38.4–49.9)
HGB: 10.9 g/dL — ABNORMAL LOW (ref 13.0–17.1)
LYMPH%: 26 % (ref 14.0–49.0)
MCH: 35.5 pg — AB (ref 27.2–33.4)
MCHC: 34 g/dL (ref 32.0–36.0)
MCV: 104.6 fL — AB (ref 79.3–98.0)
MONO#: 0.6 10*3/uL (ref 0.1–0.9)
MONO%: 16.6 % — AB (ref 0.0–14.0)
NEUT%: 54.3 % (ref 39.0–75.0)
NEUTROS ABS: 2 10*3/uL (ref 1.5–6.5)
PLATELETS: 132 10*3/uL — AB (ref 140–400)
RBC: 3.07 10*6/uL — AB (ref 4.20–5.82)
RDW: 17.8 % — ABNORMAL HIGH (ref 11.0–14.6)
WBC: 3.6 10*3/uL — AB (ref 4.0–10.3)
lymph#: 0.9 10*3/uL (ref 0.9–3.3)

## 2015-04-13 MED ORDER — BORTEZOMIB CHEMO SQ INJECTION 3.5 MG (2.5MG/ML)
1.3000 mg/m2 | Freq: Once | INTRAMUSCULAR | Status: AC
  Administered 2015-04-13: 2.5 mg via SUBCUTANEOUS
  Filled 2015-04-13: qty 2.5

## 2015-04-13 MED ORDER — ONDANSETRON HCL 8 MG PO TABS
8.0000 mg | ORAL_TABLET | Freq: Once | ORAL | Status: AC
  Administered 2015-04-13: 8 mg via ORAL

## 2015-04-13 MED ORDER — ONDANSETRON HCL 8 MG PO TABS
ORAL_TABLET | ORAL | Status: AC
Start: 2015-04-13 — End: 2015-04-13
  Filled 2015-04-13: qty 1

## 2015-04-13 NOTE — Progress Notes (Signed)
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  CURRENT THERAPY: 1) hemodialysis on Monday, Wednesday and Friday weekly under the care of [facility]. 2) weekly subcutaneous Velcade 1.3 MG/M2 in addition to Decadron 40 mg by mouth weekly. Status post 9 cycles.  INTERVAL HISTORY: Justin Woods 81 y.o. male returns to the clinic today for follow-up visit accompanied by his wife and daughter. The patient is tolerating his current treatment with subcutaneous Velcade and Decadron fairly well with no significant adverse effects. He continues his dialysis on Monday, Wednesday and Friday weekly. He has mild fatigue after the dialysis. He denied having any significant nausea or vomiting, no fever or chills. The patient denied having any significant chest pain, shortness breath, cough or hemoptysis. He is not sleeping well on Fridays when he takes his dexamethasone. He is currently taking it around 2:00 in the afternoon. He and his wife report that his Neurologist appointment is not until May 17, 2015.He is here today to start cycle #10 of his weekly subcutaneous Velcade.  MEDICAL HISTORY: Past Medical History  Diagnosis Date  . Hypertension   . COPD (chronic obstructive pulmonary disease)   . Asthma   . Chronic kidney disease   . History of kidney stones   . History of hiatal hernia   . Headache     HISTORY OF CLUSTER HEADACHES  . Arthritis   . Complication of anesthesia     TROUBLE WAKING UP  . H/O colonoscopy with polypectomy   . Allergic rhinitis   . DVT (deep venous thrombosis)     RIGHT CALF AFTER A LITHOTRIPSY  . BPH (benign prostatic  hypertrophy)   . Osteoarthritis   . Emphysema of lung     ALLERGIES:  has No Known Allergies.  MEDICATIONS:  Current Outpatient Prescriptions  Medication Sig Dispense Refill  . acyclovir (ZOVIRAX) 400 MG tablet Take 1 tablet (400 mg total) by mouth 2 (two) times daily. 60 tablet 3  . amLODipine (NORVASC) 5 MG tablet Take 5 mg by mouth daily.    . bisacodyl (DULCOLAX) 5 MG EC tablet Take 1 tablet (5 mg total) by mouth daily as needed for moderate constipation. 30 tablet 0  . budesonide-formoterol (SYMBICORT) 80-4.5 MCG/ACT inhaler Inhale 2 puffs into the lungs 2 (two) times daily.    . calcium carbonate (TUMS - DOSED IN MG ELEMENTAL CALCIUM) 500 MG chewable tablet Chew 1 tablet by mouth 3 (three) times daily.    Marland Kitchen dexamethasone (DECADRON) 4 MG tablet Take 10 tablets (40 mg total) by mouth once a week. 40 tablet 4  . fluticasone (FLONASE) 50 MCG/ACT nasal spray Place 1 spray into both nostrils daily.    . meclizine (ANTIVERT) 25 MG tablet Take 1 tablet (25 mg total) by mouth 3 (three) times daily as needed for dizziness. 30 tablet 0  . metoprolol succinate (TOPROL-XL) 25 MG 24 hr tablet Take 25 mg by mouth daily.    Marland Kitchen omeprazole (PRILOSEC) 40 MG capsule Take 40 mg by mouth daily.    . prochlorperazine (COMPAZINE) 10 MG tablet Take 1 tablet (10 mg total) by mouth every 6 (six) hours as needed for nausea or vomiting. [place]  tablet 0   No current facility-administered medications for this visit.   Facility-Administered Medications Ordered in Other Visits  Medication Dose Route Frequency Provider Last Rate Last Dose  . bortezomib SQ (VELCADE) chemo injection 2.5 mg  1.3 mg/m2 (Treatment Plan Actual) Subcutaneous Once Curt Bears, MD        SURGICAL HISTORY:  Past Surgical History  Procedure Laterality Date  . Lithotripsy    . Back surgery    . Arthroscopy right knee    . Cholecystectomy    . Skin cancer removed from ears    . Insertion of dialysis catheter Right 02/07/2015     Procedure: INSERTION OF DIALYSIS CATHETER;  Surgeon: Rosetta Posner, MD;  Location: [place];  Service: Vascular;  Laterality: Right;  . Av fistula placement Left 02/07/2015    Procedure: ARTERIOVENOUS (AV) FISTULA CREATION;  Surgeon: Rosetta Posner, MD;  Location: [place];  Service: Vascular;  Laterality: Left;  . Removal of a dialysis catheter Left 02/07/2015    Procedure: Removal of  Temporary catheter in left neck;  Surgeon: Rosetta Posner, MD;  Location: [place];  Service: Vascular;  Laterality: Left;  . Back surgery      1969    REVIEW OF SYSTEMS:  Constitutional: positive for fatigue Eyes: negative Ears, nose, mouth, throat, and face: negative Respiratory: negative Cardiovascular: negative Gastrointestinal: negative Genitourinary:negative Integument/breast: negative Hematologic/lymphatic: negative Musculoskeletal:negative Neurological: negative Behavioral/Psych: positive for sleep disturbance Endocrine: negative Allergic/Immunologic: negative   PHYSICAL EXAMINATION: General appearance: alert, cooperative, fatigued and no distress Head: Normocephalic, without obvious abnormality, atraumatic Neck: no adenopathy, no JVD, supple, symmetrical, trachea midline and thyroid not enlarged, symmetric, no tenderness/mass/nodules Lymph nodes: Cervical, supraclavicular, and axillary nodes normal. Resp: clear to auscultation bilaterally Back: symmetric, no curvature. ROM normal. No CVA tenderness. Cardio: regular rate and rhythm, S1, S2 normal, no murmur, click, rub or gallop GI: soft, non-tender; bowel sounds normal; no masses,  no organomegaly Extremities: extremities normal, atraumatic, no cyanosis or edema Neurologic: Alert and oriented X 3, normal strength and tone. Normal symmetric reflexes. Normal coordination and gait  ECOG PERFORMANCE STATUS: 1 - Symptomatic but completely ambulatory  Blood pressure 148/51, pulse 72, temperature 98.5 F (36.9 C), temperature source Oral, resp. rate 18, height  $Remov'5\' 9"'vVsoBM$  (1.753 m), weight 187 lb 14.4 oz (85.231 kg), SpO2 98 %.  LABORATORY DATA: Lab Results  Component Value Date   WBC 3.6* 04/13/2015   HGB 10.9* 04/13/2015   HCT 32.1* 04/13/2015   MCV 104.6* 04/13/2015   PLT 132* 04/13/2015      Chemistry      Component Value Date/Time   NA 141 04/13/2015 1254   NA 139 03/05/2015 0548   K 3.4* 04/13/2015 1254   K 3.7 03/05/2015 0548   CL 100* 03/05/2015 0548   CO2 30* 04/13/2015 1254   CO2 23 03/05/2015 0520   BUN 8.5 04/13/2015 1254   BUN 60* 03/05/2015 0548   CREATININE 4.0* 04/13/2015 1254   CREATININE 12.70* 03/05/2015 0548      Component Value Date/Time   CALCIUM 8.6 04/13/2015 1254   CALCIUM 9.0 03/05/2015 0520   ALKPHOS 81 04/13/2015 1254   ALKPHOS 64 03/05/2015 0520   AST 21 04/13/2015 1254   AST 23 03/05/2015 0520   ALT 12 04/13/2015 1254   ALT 18 03/05/2015 0520   BILITOT 1.33* 04/13/2015 1254   BILITOT 0.7 03/05/2015 0520       RADIOGRAPHIC STUDIES: No results found.  ASSESSMENT AND PLAN: This is  a very pleasant 81 years old white male recently diagnosed with multiple myeloma as well as kappa light chain nephropathy. The patient is currently undergoing hemodialysis on Monday Wednesday and Friday. He is currently on treatment with subcutaneous Velcade and Decadron and tolerating it well. Status post 9 cycles. I recommended for the patient to proceed with his treatment as scheduled. He will start cycle #10 today. For his sleep disturbance related to the dexamethasone, I have advised him to take the dexamethasone with his breakfast at 7:00 am. He had restaging protein studies drawn today. He will follow up in one week to discuss the results of the restaging protein studies and continue with his treatment. The patient was advised to call immediately if he has any concerning symptoms in the interval. The patient voices understanding of current disease status and treatment options and is in agreement with the current care  plan.  All questions were answered. The patient knows to call the clinic with any problems, questions or concerns. We can certainly see the patient much sooner if necessary.  Carlton Adam, PA-C 04/13/2015  Disclaimer: This note was dictated with voice recognition software. Similar sounding words can inadvertently be transcribed and may not be corrected upon review.

## 2015-04-13 NOTE — Progress Notes (Signed)
OK to treat for creatinine of 4 per [name], PA

## 2015-04-13 NOTE — Patient Instructions (Signed)
[facility] Discharge Instructions for Patients Receiving Chemotherapy  Today you received the following chemotherapy agents: Velcade.  To help prevent nausea and vomiting after your treatment, we encourage you to take your nausea medication: Compazine 10 mg every 6 hours as needed.   If you develop nausea and vomiting that is not controlled by your nausea medication, call the clinic.   BELOW ARE SYMPTOMS THAT SHOULD BE REPORTED IMMEDIATELY:  *FEVER GREATER THAN 100.5 F  *CHILLS WITH OR WITHOUT FEVER  NAUSEA AND VOMITING THAT IS NOT CONTROLLED WITH YOUR NAUSEA MEDICATION  *UNUSUAL SHORTNESS OF BREATH  *UNUSUAL BRUISING OR BLEEDING  TENDERNESS IN MOUTH AND THROAT WITH OR WITHOUT PRESENCE OF ULCERS  *URINARY PROBLEMS  *BOWEL PROBLEMS  UNUSUAL RASH Items with * indicate a potential emergency and should be followed up as soon as possible.  Feel free to call the clinic you have any questions or concerns. The clinic phone number is [phone].  Please show the CHEMO ALERT CARD at check-in to the Emergency Department and triage nurse.   

## 2015-04-16 NOTE — Patient Instructions (Signed)
Continue labs and chemotherapy as scheduled Follow up in 1 week as previously scheduled to discuss the results of your restaging protein studies

## 2015-04-17 LAB — KAPPA/LAMBDA LIGHT CHAINS
KAPPA FREE LGHT CHN: 794 mg/dL — AB (ref 0.33–1.94)
LAMBDA FREE LGHT CHN: 0.73 mg/dL (ref 0.57–2.63)

## 2015-04-17 LAB — BETA 2 MICROGLOBULIN, SERUM: BETA 2 MICROGLOBULIN: 27.1 mg/L — AB (ref <=2.51)

## 2015-04-17 LAB — IGG, IGA, IGM
IgA: 13 mg/dL — ABNORMAL LOW (ref 68–379)
IgG (Immunoglobin G), Serum: 415 mg/dL — ABNORMAL LOW (ref 650–1600)
IgM, Serum: 6 mg/dL — ABNORMAL LOW (ref 41–251)

## 2015-04-19 ENCOUNTER — Other Ambulatory Visit (HOSPITAL_BASED_OUTPATIENT_CLINIC_OR_DEPARTMENT_OTHER): Payer: PPO

## 2015-04-19 ENCOUNTER — Ambulatory Visit (HOSPITAL_BASED_OUTPATIENT_CLINIC_OR_DEPARTMENT_OTHER): Payer: PPO

## 2015-04-19 ENCOUNTER — Encounter: Payer: Self-pay | Admitting: Internal Medicine

## 2015-04-19 ENCOUNTER — Telehealth: Payer: Self-pay | Admitting: Internal Medicine

## 2015-04-19 ENCOUNTER — Ambulatory Visit (HOSPITAL_BASED_OUTPATIENT_CLINIC_OR_DEPARTMENT_OTHER): Payer: PPO | Admitting: Internal Medicine

## 2015-04-19 VITALS — BP 148/68 | HR 82 | Temp 98.8°F | Resp 18 | Ht 69.0 in | Wt 186.1 lb

## 2015-04-19 DIAGNOSIS — C9000 Multiple myeloma not having achieved remission: Secondary | ICD-10-CM

## 2015-04-19 DIAGNOSIS — Z5112 Encounter for antineoplastic immunotherapy: Secondary | ICD-10-CM | POA: Diagnosis not present

## 2015-04-19 DIAGNOSIS — Z992 Dependence on renal dialysis: Secondary | ICD-10-CM | POA: Diagnosis not present

## 2015-04-19 DIAGNOSIS — N186 End stage renal disease: Secondary | ICD-10-CM

## 2015-04-19 DIAGNOSIS — Z5111 Encounter for antineoplastic chemotherapy: Secondary | ICD-10-CM

## 2015-04-19 LAB — CBC WITH DIFFERENTIAL/PLATELET
BASO%: 1.6 % (ref 0.0–2.0)
BASOS ABS: 0.1 10*3/uL (ref 0.0–0.1)
EOS ABS: 0.1 10*3/uL (ref 0.0–0.5)
EOS%: 2.7 % (ref 0.0–7.0)
HCT: 33.5 % — ABNORMAL LOW (ref 38.4–49.9)
HEMOGLOBIN: 11.2 g/dL — AB (ref 13.0–17.1)
LYMPH%: 27.1 % (ref 14.0–49.0)
MCH: 35.7 pg — AB (ref 27.2–33.4)
MCHC: 33.4 g/dL (ref 32.0–36.0)
MCV: 106.8 fL — ABNORMAL HIGH (ref 79.3–98.0)
MONO#: 0.7 10*3/uL (ref 0.1–0.9)
MONO%: 19.9 % — AB (ref 0.0–14.0)
NEUT%: 48.7 % (ref 39.0–75.0)
NEUTROS ABS: 1.8 10*3/uL (ref 1.5–6.5)
PLATELETS: 132 10*3/uL — AB (ref 140–400)
RBC: 3.14 10*6/uL — AB (ref 4.20–5.82)
RDW: 18.8 % — ABNORMAL HIGH (ref 11.0–14.6)
WBC: 3.6 10*3/uL — ABNORMAL LOW (ref 4.0–10.3)
lymph#: 1 10*3/uL (ref 0.9–3.3)

## 2015-04-19 LAB — COMPREHENSIVE METABOLIC PANEL (CC13)
ALT: 13 U/L (ref 0–55)
ANION GAP: 10 meq/L (ref 3–11)
AST: 19 U/L (ref 5–34)
Albumin: 3.3 g/dL — ABNORMAL LOW (ref 3.5–5.0)
Alkaline Phosphatase: 81 U/L (ref 40–150)
BILIRUBIN TOTAL: 1.43 mg/dL — AB (ref 0.20–1.20)
BUN: 18.8 mg/dL (ref 7.0–26.0)
CALCIUM: 8.6 mg/dL (ref 8.4–10.4)
CHLORIDE: 104 meq/L (ref 98–109)
CO2: 29 mEq/L (ref 22–29)
CREATININE: 6.4 mg/dL — AB (ref 0.7–1.3)
EGFR: 7 mL/min/{1.73_m2} — ABNORMAL LOW (ref >90)
Glucose: 95 mg/dl (ref 70–140)
Potassium: 4.5 mEq/L (ref 3.5–5.1)
Sodium: 142 mEq/L (ref 136–145)
TOTAL PROTEIN: 5.7 g/dL — AB (ref 6.4–8.3)

## 2015-04-19 MED ORDER — ONDANSETRON HCL 8 MG PO TABS
ORAL_TABLET | ORAL | Status: AC
  Filled 2015-04-19: qty 1

## 2015-04-19 MED ORDER — BORTEZOMIB CHEMO SQ INJECTION 3.5 MG (2.5MG/ML)
1.3000 mg/m2 | Freq: Once | INTRAMUSCULAR | Status: AC
  Administered 2015-04-19: 2.5 mg via SUBCUTANEOUS
  Filled 2015-04-19: qty 2.5

## 2015-04-19 MED ORDER — ONDANSETRON HCL 8 MG PO TABS
8.0000 mg | ORAL_TABLET | Freq: Once | ORAL | Status: AC
  Administered 2015-04-19: 8 mg via ORAL

## 2015-04-19 NOTE — Progress Notes (Signed)
OK to treat with today's creatinine 6.4 per Dr Julien Nordmann

## 2015-04-19 NOTE — Telephone Encounter (Signed)
Gave and printed appt sched and avs for pt for for pt for Sept thru [facility]

## 2015-04-19 NOTE — Patient Instructions (Signed)
[facility] Discharge Instructions for Patients Receiving Chemotherapy  Today you received the following chemotherapy agents:  Velcade  To help prevent nausea and vomiting after your treatment, we encourage you to take your nausea medication as prescribed.   If you develop nausea and vomiting that is not controlled by your nausea medication, call the clinic.   BELOW ARE SYMPTOMS THAT SHOULD BE REPORTED IMMEDIATELY:  *FEVER GREATER THAN 100.5 F  *CHILLS WITH OR WITHOUT FEVER  NAUSEA AND VOMITING THAT IS NOT CONTROLLED WITH YOUR NAUSEA MEDICATION  *UNUSUAL SHORTNESS OF BREATH  *UNUSUAL BRUISING OR BLEEDING  TENDERNESS IN MOUTH AND THROAT WITH OR WITHOUT PRESENCE OF ULCERS  *URINARY PROBLEMS  *BOWEL PROBLEMS  UNUSUAL RASH Items with * indicate a potential emergency and should be followed up as soon as possible.  Feel free to call the clinic you have any questions or concerns. The clinic phone number is [phone].  Please show the CHEMO ALERT CARD at check-in to the Emergency Department and triage nurse.   

## 2015-04-19 NOTE — Progress Notes (Signed)
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  CURRENT THERAPY: 1) hemodialysis on Monday, Wednesday and Friday weekly under the care of [facility]. 2) weekly subcutaneous Velcade 1.3 MG/M2 in addition to Decadron 40 mg by mouth weekly. Status post 10 cycles.  INTERVAL HISTORY: Justin Woods 81 y.o. male returns to the clinic today for follow-up visit accompanied by his wife and daughter. The patient is tolerating his current treatment with subcutaneous Velcade and Decadron fairly well with no significant adverse effects. He continues his dialysis on Monday, Wednesday and Friday weekly. He has mild fatigue after the dialysis. He denied having any significant nausea or vomiting, no fever or chills. The patient denied having any significant chest pain, shortness breath, cough or hemoptysis. He also has some insomnia especially after dialysis and steroid treatment. He had repeat myeloma panel performed recently and he is here for evaluation and discussion of his lab results.  MEDICAL HISTORY: Past Medical History  Diagnosis Date  . Hypertension   . COPD (chronic obstructive pulmonary disease)   . Asthma   . Chronic kidney disease   . History of kidney stones   . History of hiatal hernia   . Headache     HISTORY OF CLUSTER HEADACHES  . Arthritis   . Complication of anesthesia     TROUBLE WAKING UP  . H/O colonoscopy with polypectomy   . Allergic rhinitis   . DVT (deep venous thrombosis)     RIGHT CALF AFTER A LITHOTRIPSY  . BPH (benign prostatic hypertrophy)   . Osteoarthritis   . Emphysema of lung     ALLERGIES:  has No Known  Allergies.  MEDICATIONS:  Current Outpatient Prescriptions  Medication Sig Dispense Refill  . acyclovir (ZOVIRAX) 400 MG tablet Take 1 tablet (400 mg total) by mouth 2 (two) times daily. 60 tablet 3  . amLODipine (NORVASC) 5 MG tablet Take 5 mg by mouth daily.    . bisacodyl (DULCOLAX) 5 MG EC tablet Take 1 tablet (5 mg total) by mouth daily as needed for moderate constipation. 30 tablet 0  . budesonide-formoterol (SYMBICORT) 80-4.5 MCG/ACT inhaler Inhale 2 puffs into the lungs 2 (two) times daily.    . calcium carbonate (TUMS - DOSED IN MG ELEMENTAL CALCIUM) 500 MG chewable tablet Chew 1 tablet by mouth 3 (three) times daily.    Marland Kitchen dexamethasone (DECADRON) 4 MG tablet Take 10 tablets (40 mg total) by mouth once a week. 40 tablet 4  . fluticasone (FLONASE) 50 MCG/ACT nasal spray Place 1 spray into both nostrils daily.    . meclizine (ANTIVERT) 25 MG tablet Take 1 tablet (25 mg total) by mouth 3 (three) times daily as needed for dizziness. 30 tablet 0  . metoprolol succinate (TOPROL-XL) 25 MG 24 hr tablet Take 25 mg by mouth daily.    Marland Kitchen omeprazole (PRILOSEC) 40 MG capsule Take 40 mg by mouth daily.    . prochlorperazine (COMPAZINE) 10 MG tablet Take 1 tablet (10 mg total) by mouth every 6 (six) hours as needed for nausea or vomiting. 30 tablet 0   No current facility-administered medications for this visit.    SURGICAL HISTORY:  Past Surgical  History  Procedure Laterality Date  . Lithotripsy    . Back surgery    . Arthroscopy right knee    . Cholecystectomy    . Skin cancer removed from ears    . Insertion of dialysis catheter Right 02/07/2015    Procedure: INSERTION OF DIALYSIS CATHETER;  Surgeon: Rosetta Posner, MD;  Location: [place];  Service: Vascular;  Laterality: Right;  . Av fistula placement Left 02/07/2015    Procedure: ARTERIOVENOUS (AV) FISTULA CREATION;  Surgeon: Rosetta Posner, MD;  Location: [place];  Service: Vascular;  Laterality: Left;  . Removal of a dialysis catheter Left  02/07/2015    Procedure: Removal of  Temporary catheter in left neck;  Surgeon: Rosetta Posner, MD;  Location: [place];  Service: Vascular;  Laterality: Left;  . Back surgery      1969    REVIEW OF SYSTEMS:  Constitutional: positive for fatigue Eyes: negative Ears, nose, mouth, throat, and face: negative Respiratory: negative Cardiovascular: negative Gastrointestinal: negative Genitourinary:negative Integument/breast: negative Hematologic/lymphatic: negative Musculoskeletal:negative Neurological: negative Behavioral/Psych: negative Endocrine: negative Allergic/Immunologic: negative   PHYSICAL EXAMINATION: General appearance: alert, cooperative, fatigued and no distress Head: Normocephalic, without obvious abnormality, atraumatic Neck: no adenopathy, no JVD, supple, symmetrical, trachea midline and thyroid not enlarged, symmetric, no tenderness/mass/nodules Lymph nodes: Cervical, supraclavicular, and axillary nodes normal. Resp: clear to auscultation bilaterally Back: symmetric, no curvature. ROM normal. No CVA tenderness. Cardio: regular rate and rhythm, S1, S2 normal, no murmur, click, rub or gallop GI: soft, non-tender; bowel sounds normal; no masses,  no organomegaly Extremities: extremities normal, atraumatic, no cyanosis or edema Neurologic: Alert and oriented X 3, normal strength and tone. Normal symmetric reflexes. Normal coordination and gait  ECOG PERFORMANCE STATUS: 1 - Symptomatic but completely ambulatory  Blood pressure 148/68, pulse 82, temperature 98.8 F (37.1 C), temperature source Oral, resp. rate 18, height _0  (1.753 m), weight 186 lb 1.6 oz (84.414 kg), SpO2 98 %.  LABORATORY DATA: Lab Results  Component Value Date   WBC 3.6* 04/19/2015   HGB 11.2* 04/19/2015   HCT 33.5* 04/19/2015   MCV 106.8* 04/19/2015   PLT 132* 04/19/2015      Chemistry      Component Value Date/Time   NA 141 04/13/2015 1254   NA 139 03/05/2015 0548   K 3.4* 04/13/2015 1254     K 3.7 03/05/2015 0548   CL 100* 03/05/2015 0548   CO2 30* 04/13/2015 1254   CO2 23 03/05/2015 0520   BUN 8.5 04/13/2015 1254   BUN 60* 03/05/2015 0548   CREATININE 4.0* 04/13/2015 1254   CREATININE 12.70* 03/05/2015 0548      Component Value Date/Time   CALCIUM 8.6 04/13/2015 1254   CALCIUM 9.0 03/05/2015 0520   ALKPHOS 81 04/13/2015 1254   ALKPHOS 64 03/05/2015 0520   AST 21 04/13/2015 1254   AST 23 03/05/2015 0520   ALT 12 04/13/2015 1254   ALT 18 03/05/2015 0520   BILITOT 1.33* 04/13/2015 1254   BILITOT 0.7 03/05/2015 0520       RADIOGRAPHIC STUDIES: No results found.  ASSESSMENT AND PLAN: This is a very pleasant 81 years old white male recently diagnosed with multiple myeloma as well as kappa light chain nephropathy. The patient is currently undergoing hemodialysis on Monday Wednesday and Friday. He is currently on treatment with subcutaneous Velcade and Decadron and tolerating it well. Status post 10 cycles. His myeloma panel showed significant improvement in his protein study with decrease of the  free Light chain from over 12,000 to 794. I discussed the lab result with the patient and his family. I recommended for the patient to proceed with his treatment as scheduled. He will start cycle #11 today. He would come back for follow-up visit in 3 weeks for reevaluation and management of any adverse effect of his treatment. For insomnia, I offered the patient prescription for Restoril but he would like to try over-the-counter Tylenol PM first. The patient was advised to call immediately if he has any concerning symptoms in the interval. The patient voices understanding of current disease status and treatment options and is in agreement with the current care plan.  All questions were answered. The patient knows to call the clinic with any problems, questions or concerns. We can certainly see the patient much sooner if necessary.  Disclaimer: This note was dictated with  voice recognition software. Similar sounding words can inadvertently be transcribed and may not be corrected upon review.

## 2015-04-19 NOTE — Telephone Encounter (Signed)
Gave and printed appt sched and avs for pt for Sept thru DEC °

## 2015-04-26 ENCOUNTER — Telehealth: Payer: Self-pay | Admitting: *Deleted

## 2015-04-26 NOTE — Telephone Encounter (Signed)
Per family request I have moved appts to late in the day

## 2015-04-27 ENCOUNTER — Other Ambulatory Visit (HOSPITAL_BASED_OUTPATIENT_CLINIC_OR_DEPARTMENT_OTHER): Payer: PPO

## 2015-04-27 ENCOUNTER — Ambulatory Visit (HOSPITAL_BASED_OUTPATIENT_CLINIC_OR_DEPARTMENT_OTHER): Payer: PPO

## 2015-04-27 ENCOUNTER — Telehealth: Payer: Self-pay | Admitting: *Deleted

## 2015-04-27 VITALS — BP 151/69 | HR 84 | Temp 97.9°F | Resp 18

## 2015-04-27 DIAGNOSIS — C9000 Multiple myeloma not having achieved remission: Secondary | ICD-10-CM

## 2015-04-27 LAB — COMPREHENSIVE METABOLIC PANEL (CC13)
ALT: 16 U/L (ref 0–55)
ANION GAP: 10 meq/L (ref 3–11)
AST: 28 U/L (ref 5–34)
Albumin: 3.7 g/dL (ref 3.5–5.0)
Alkaline Phosphatase: 87 U/L (ref 40–150)
BUN: 9.2 mg/dL (ref 7.0–26.0)
CALCIUM: 8.5 mg/dL (ref 8.4–10.4)
CHLORIDE: 101 meq/L (ref 98–109)
CO2: 28 meq/L (ref 22–29)
CREATININE: 3.7 mg/dL — AB (ref 0.7–1.3)
EGFR: 14 mL/min/{1.73_m2} — ABNORMAL LOW (ref >90)
Glucose: 259 mg/dl — ABNORMAL HIGH (ref 70–140)
POTASSIUM: 5.1 meq/L (ref 3.5–5.1)
Sodium: 139 mEq/L (ref 136–145)
Total Bilirubin: 1.48 mg/dL — ABNORMAL HIGH (ref 0.20–1.20)
Total Protein: 6.4 g/dL (ref 6.4–8.3)

## 2015-04-27 LAB — CBC WITH DIFFERENTIAL/PLATELET
BASO%: 0.9 % (ref 0.0–2.0)
BASOS ABS: 0 10*3/uL (ref 0.0–0.1)
EOS%: 0.1 % (ref 0.0–7.0)
Eosinophils Absolute: 0 10*3/uL (ref 0.0–0.5)
HCT: 36.7 % — ABNORMAL LOW (ref 38.4–49.9)
HGB: 12.2 g/dL — ABNORMAL LOW (ref 13.0–17.1)
LYMPH%: 12.4 % — AB (ref 14.0–49.0)
MCH: 35.7 pg — AB (ref 27.2–33.4)
MCHC: 33.2 g/dL (ref 32.0–36.0)
MCV: 107.4 fL — ABNORMAL HIGH (ref 79.3–98.0)
MONO#: 0 10*3/uL — AB (ref 0.1–0.9)
MONO%: 1.1 % (ref 0.0–14.0)
NEUT#: 2.5 10*3/uL (ref 1.5–6.5)
NEUT%: 85.5 % — AB (ref 39.0–75.0)
PLATELETS: 147 10*3/uL (ref 140–400)
RBC: 3.42 10*6/uL — AB (ref 4.20–5.82)
RDW: 16.4 % — ABNORMAL HIGH (ref 11.0–14.6)
WBC: 2.9 10*3/uL — ABNORMAL LOW (ref 4.0–10.3)
lymph#: 0.4 10*3/uL — ABNORMAL LOW (ref 0.9–3.3)

## 2015-04-27 MED ORDER — ONDANSETRON HCL 8 MG PO TABS
8.0000 mg | ORAL_TABLET | Freq: Once | ORAL | Status: AC
  Administered 2015-04-27: 8 mg via ORAL

## 2015-04-27 MED ORDER — ONDANSETRON HCL 8 MG PO TABS
ORAL_TABLET | ORAL | Status: AC
  Filled 2015-04-27: qty 1

## 2015-04-27 MED ORDER — BORTEZOMIB CHEMO SQ INJECTION 3.5 MG (2.5MG/ML)
1.3000 mg/m2 | Freq: Once | INTRAMUSCULAR | Status: AC
  Administered 2015-04-27: 2.5 mg via SUBCUTANEOUS
  Filled 2015-04-27: qty 2.5

## 2015-04-27 NOTE — Telephone Encounter (Signed)
Per patient request I have moved appts to later in the day. Patient aware

## 2015-04-27 NOTE — Progress Notes (Signed)
Per Dr Julien Nordmann pt may decrease decadron dose to 20 mg once a week -this will not be as effective as higher dose .

## 2015-04-27 NOTE — Patient Instructions (Signed)
[facility] Discharge Instructions for Patients Receiving Chemotherapy  Today you received the following chemotherapy agents:  Velcade  To help prevent nausea and vomiting after your treatment, we encourage you to take your nausea medication as ordered per MD.   If you develop nausea and vomiting that is not controlled by your nausea medication, call the clinic.   BELOW ARE SYMPTOMS THAT SHOULD BE REPORTED IMMEDIATELY:  *FEVER GREATER THAN 100.5 F  *CHILLS WITH OR WITHOUT FEVER  NAUSEA AND VOMITING THAT IS NOT CONTROLLED WITH YOUR NAUSEA MEDICATION  *UNUSUAL SHORTNESS OF BREATH  *UNUSUAL BRUISING OR BLEEDING  TENDERNESS IN MOUTH AND THROAT WITH OR WITHOUT PRESENCE OF ULCERS  *URINARY PROBLEMS  *BOWEL PROBLEMS  UNUSUAL RASH Items with * indicate a potential emergency and should be followed up as soon as possible.  Feel free to call the clinic you have any questions or concerns. The clinic phone number is [phone].  Please show the CHEMO ALERT CARD at check-in to the Emergency Department and triage nurse.   

## 2015-04-30 ENCOUNTER — Telehealth: Payer: Self-pay | Admitting: Internal Medicine

## 2015-04-30 ENCOUNTER — Telehealth: Payer: Self-pay | Admitting: *Deleted

## 2015-04-30 NOTE — Telephone Encounter (Signed)
lvm for pt regarding to appts moved to after 2pm per pt request.

## 2015-04-30 NOTE — Telephone Encounter (Signed)
Per staff message and POF I have scheduled appts. Advised scheduler of appts. JMW  

## 2015-05-03 ENCOUNTER — Encounter: Payer: Self-pay | Admitting: Neurology

## 2015-05-03 ENCOUNTER — Ambulatory Visit (INDEPENDENT_AMBULATORY_CARE_PROVIDER_SITE_OTHER): Payer: PPO | Admitting: Neurology

## 2015-05-03 VITALS — BP 140/70 | HR 85 | Ht 69.0 in | Wt 185.4 lb

## 2015-05-03 DIAGNOSIS — G620 Drug-induced polyneuropathy: Secondary | ICD-10-CM

## 2015-05-03 DIAGNOSIS — C9000 Multiple myeloma not having achieved remission: Secondary | ICD-10-CM | POA: Diagnosis not present

## 2015-05-03 DIAGNOSIS — R51 Headache: Secondary | ICD-10-CM | POA: Diagnosis not present

## 2015-05-03 DIAGNOSIS — R519 Headache, unspecified: Secondary | ICD-10-CM

## 2015-05-03 DIAGNOSIS — Z992 Dependence on renal dialysis: Secondary | ICD-10-CM

## 2015-05-03 DIAGNOSIS — G4486 Cervicogenic headache: Secondary | ICD-10-CM

## 2015-05-03 DIAGNOSIS — D6859 Other primary thrombophilia: Secondary | ICD-10-CM | POA: Diagnosis not present

## 2015-05-03 DIAGNOSIS — T451X5A Adverse effect of antineoplastic and immunosuppressive drugs, initial encounter: Secondary | ICD-10-CM

## 2015-05-03 DIAGNOSIS — N186 End stage renal disease: Secondary | ICD-10-CM

## 2015-05-03 MED ORDER — GABAPENTIN 100 MG PO CAPS: 100.0000 mg | ORAL_CAPSULE | Freq: Every day | ORAL | Status: AC

## 2015-05-03 NOTE — Progress Notes (Signed)
[facility] Clinic Note - Initial Visit   Date: 05/03/2015  VANDELL KUN MRN: [id number] DOB: December 03, 1930   Dear Dr. Lysle Rubens:  Thank you for your kind referral of TRESTEN PANTOJA for consultation of dizziness. Although his history is well known to you, please allow [place] to reiterate it for the purpose of our medical record. The patient was accompanied to the clinic by his wife who also provides collateral information.     History of Present Illness: Justin Woods is a 81 y.o. right-handed Caucasian male with hypertension, GERD, ESRD on hemodialysis, multiple myeloma and kappa light chain nephropathy on Velcade and decadron presenting for evaluation of dizziness and head pressure.    Patient was in his usual state of health until the summer of 2016 when he developed acute renal failure and biopsy confirm myeloma nephropathy and bone marrow biopsy also showed plasma cell neoplasm.  He had an extended hospital course and start hemidialysis, plasmapheresis, and chemotherapy with Velcade and Decadron.  He was discharged home on 02/16/2015 and has been continuing his chemotherapy.  About a week after going home, he started developing constant pressure and pounding of the head.  Symptoms gradually worsened and he also developed dizziness so presented to the emergency department on 03/05/2015. MRI brain did not show any acute stroke, but there was note of reduced flow involving the left vertebral artery, however, this was not a dedicated study of the vessels.  He was discharged with meclizine.  He continues to have head pressure with lightheadedness 2-3 times per week and can last several hours.  Denies any spinning sensation.  No triggers.  No nausea, vomiting, photophobia, or phonophobia.  Tylneol and meclizine helps when he takes it, but he does not take it very often.   Since starting chemotherapy, he also complains of whole body tingling sensation which comes and goes.   Feet paresthesias are worse.  No problems with balance and no recent falls.  He walks independently.   Out-side paper records, electronic medical record, and images have been reviewed where available and summarized as:  MRI brain wo contrast 03/05/2015: 1. No acute intracranial infarct or other abnormality identified. 2. Absent flow void within the diminutive left vertebral artery, which may reflect slow flow or occlusion. The dominant right vertebral artery is widely patent to the vertebrobasilar junction as is the basilar artery. Remainder of the intravascular flow voids are maintained. 3. Generalized cerebral atrophy with mild chronic small vessel ischemic disease and remote left basal ganglia lacunar infarct.  MRI cervical spine wo contrast 01/28/2014: 1. At C3-4 there is a mild broad-based disc bulge. Moderate bilateral facet arthropathy. Bilateral uncovertebral degenerative changes. Moderate left and mild right foraminal stenosis. 2. At C4-5 there is a mild broad-based disc bulge. Severe left facet arthropathy. Moderate left foraminal stenosis. 3. At C5-6 there is a mild broad-based disc bulge. Bilateral uncovertebral degenerative changes. Mild bilateral facet arthropathy, left greater than right. Moderate right and severe left foraminal stenosis. 4. At C6-7 there is a mild broad-based disc bulge. Right uncovertebral degenerative changes. Moderate right and mild left foraminal stenosis.  Echo 02/02/2015:  Normal biventricular size and systolic function. Mild tricuspid regurgitation. Mild pulmonary hypertension.  Past Medical History  Diagnosis Date  . Hypertension   . COPD (chronic obstructive pulmonary disease)   . Asthma   . Chronic kidney disease   . History of kidney stones   . History of hiatal hernia   . Headache  HISTORY OF CLUSTER HEADACHES  . Arthritis   . Complication of anesthesia     TROUBLE WAKING UP  . H/O colonoscopy with polypectomy   . Allergic rhinitis     . DVT (deep venous thrombosis)     RIGHT CALF AFTER A LITHOTRIPSY  . BPH (benign prostatic hypertrophy)   . Osteoarthritis   . Emphysema of lung     Past Surgical History  Procedure Laterality Date  . Lithotripsy    . Back surgery    . Arthroscopy right knee    . Cholecystectomy    . Skin cancer removed from ears    . Insertion of dialysis catheter Right 02/07/2015    Procedure: INSERTION OF DIALYSIS CATHETER;  Surgeon: Rosetta Posner, MD;  Location: [facility];  Service: Vascular;  Laterality: Right;  . Av fistula placement Left 02/07/2015    Procedure: ARTERIOVENOUS (AV) FISTULA CREATION;  Surgeon: Rosetta Posner, MD;  Location: [facility];  Service: Vascular;  Laterality: Left;  . Removal of a dialysis catheter Left 02/07/2015    Procedure: Removal of  Temporary catheter in left neck;  Surgeon: Rosetta Posner, MD;  Location: [facility];  Service: Vascular;  Laterality: Left;  . Back surgery      1969     Medications:  Outpatient Encounter Prescriptions as of 05/03/2015  Medication Sig Note  . acyclovir (ZOVIRAX) 400 MG tablet Take 1 tablet (400 mg total) by mouth 2 (two) times daily.   Marland Kitchen amLODipine (NORVASC) 5 MG tablet Take 5 mg by mouth daily.   . bisacodyl (DULCOLAX) 5 MG EC tablet Take 1 tablet (5 mg total) by mouth daily as needed for moderate constipation.   . Bortezomib (VELCADE IJ) Inject as directed.   . budesonide-formoterol (SYMBICORT) 80-4.5 MCG/ACT inhaler Inhale 2 puffs into the lungs 2 (two) times daily.   . calcium carbonate (TUMS - DOSED IN MG ELEMENTAL CALCIUM) 500 MG chewable tablet Chew 1 tablet by mouth 3 (three) times daily. 02/21/2015: Taking this instead of fosrenal  . dexamethasone (DECADRON) 4 MG tablet Take 10 tablets (40 mg total) by mouth once a week.   . fluticasone (FLONASE) 50 MCG/ACT nasal spray Place 1 spray into both nostrils daily.   . meclizine (ANTIVERT) 25 MG tablet Take 1 tablet (25 mg total) by mouth 3 (three) times daily as needed for dizziness. 04/19/2015: Not  needed  . metoprolol succinate (TOPROL-XL) 25 MG 24 hr tablet Take 25 mg by mouth daily.   Marland Kitchen omeprazole (PRILOSEC) 40 MG capsule Take 40 mg by mouth daily.   . prochlorperazine (COMPAZINE) 10 MG tablet Take 1 tablet (10 mg total) by mouth every 6 (six) hours as needed for nausea or vomiting. 04/19/2015: Not needed   No facility-administered encounter medications on file as of 05/03/2015.     Allergies: No Known Allergies  Family History: Family History  Problem Relation Age of Onset  . Hypertension Mother   . Hypertension Father   . Cancer Brother   . Cancer Brother     Social History: Social History  Substance Use Topics  . Smoking status: Former Smoker -- 2.00 packs/day    Types: Cigarettes  . Smokeless tobacco: Never Used  . Alcohol Use: No   Social History   Social History Narrative   Lives with wife in a one story home.  Has 2 children.  Retired from [employer].  Education: 8th grade.    Review of Systems:  CONSTITUTIONAL: No fevers, chills, night sweats,  or weight loss.   EYES: No visual changes or eye pain ENT: No hearing changes.  No history of nose bleeds.   RESPIRATORY: No cough, wheezing +shortness of breath.   CARDIOVASCULAR: Negative for chest pain, and palpitations.   GI: Negative for abdominal discomfort, blood in stools or black stools.  No recent change in bowel habits.   GU:  No history of incontinence.   MUSCLOSKELETAL: No history of joint pain or swelling.  No myalgias.   SKIN: Negative for lesions, rash, and itching.   HEMATOLOGY/ONCOLOGY: Negative for prolonged bleeding, bruising easily, and swollen nodes.  +history of cancer.   ENDOCRINE: Negative for cold or heat intolerance, polydipsia or goiter.   PSYCH:  No depression or anxiety symptoms.   NEURO: As Above.   Vital Signs:  BP 140/70 mmHg  Pulse 85  Ht $R'5\' 9"'Kg$  (1.753 m)  Wt 185 lb 6 oz (84.086 kg)  BMI 27.36 kg/m2  SpO2 96%   General Medical Exam:   General:  Tired appearing,  comfortable.   Eyes/ENT: see cranial nerve examination.   Neck: No masses appreciated.  Full range of motion without tenderness.  No carotid bruits. Respiratory:  Clear to auscultation, good air entry bilaterally.   Cardiac:  Regular rate and rhythm, no murmur.   Extremities:  Palpable thrill over left lateral forearm .  Skin:  No rashes or lesions.  Neurological Exam: MENTAL STATUS including orientation to time, place, person, recent and remote memory, attention span and concentration, language, and fund of knowledge is normal.  Speech is not dysarthric.  CRANIAL NERVES: II:  No visual field defects.  Unremarkable fundi.   III-IV-VI: Pupils equal round and reactive to light.  Normal conjugate, extra-ocular eye movements in all directions of gaze.  No nystagmus.  No ptosis.  V:  Normal facial sensation.   VII:  Flattening of the right nasolabial fold, symmetric smile. No pathologic facial reflexes.  VIII:  Normal hearing and vestibular function.   IX-X:  Normal palatal movement.   XI:  Normal shoulder shrug and head rotation.   XII:  Normal tongue strength and range of motion, no deviation or fasciculation.  MOTOR:  No atrophy, fasciculations or abnormal movements.  No pronator drift.  Tone is normal.    Right Upper Extremity:    Left Upper Extremity:    Deltoid  5/5   Deltoid  5/5   Biceps  5/5   Biceps  5/5   Triceps  5/5   Triceps  5/5   Wrist extensors  5/5   Wrist extensors  5/5   Wrist flexors  5/5   Wrist flexors  5/5   Finger extensors  5/5   Finger extensors  5/5   Finger flexors  5/5   Finger flexors  5/5   Dorsal interossei  5/5   Dorsal interossei  5/5   Abductor pollicis  5/5   Abductor pollicis  5/5   Tone (Ashworth scale)  0  Tone (Ashworth scale)  0   Right Lower Extremity:    Left Lower Extremity:    Hip flexors  5/5   Hip flexors  5/5   Hip extensors  5/5   Hip extensors  5/5   Knee flexors  5/5   Knee flexors  5/5   Knee extensors  5/5   Knee extensors  5/5     Dorsiflexors  5/5   Dorsiflexors  5/5   Plantarflexors  5/5   Plantarflexors  5/5   Toe  extensors  5/5   Toe extensors  5/5   Toe flexors  5/5   Toe flexors  5/5   Tone (Ashworth scale)  0  Tone (Ashworth scale)  0   MSRs:  Right                                                                 Left brachioradialis 2+  brachioradialis 2+  biceps 2+  biceps 2+  triceps 2+  triceps 2+  patellar 1+  patellar 1+  ankle jerk 0  ankle jerk 0  Hoffman no  Hoffman no  plantar response down  plantar response down   SENSORY:  Absent vibration at the ankles bilaterally.  Temperature and pin prick is also reduced in the legs.  Sensation intact to all modalities in the upper extremities.  COORDINATION/GAIT: Normal finger-to- nose-finger and heel-to-shin.  Intact rapid alternating movements bilaterally.  Gait narrow based and stable. Tandem and stressed gait intact.    IMPRESSION: 1.  New onset head pressure with dizziness, most suggestive of cervicogenic headaches.  However, with his atypical presentation of head discomfort and history of multiple myeloma, secondary cause of headache and dizziness needs to be excluded.  Will obtain MRA head and neck as well as MRV.  OK to use tyleneol and meclizine as needed - no more than twice per week.  Orthostatic vital signs were normal today  2.  Peripheral neuropathy due to chemotherapy (Velcade).  Symptoms primarily manifesting with sensory painful paresthesias, moreso than motor deficits.  Will start him on gabapentin $RemoveBefor'100mg'YoGufpmpRnVP$  daily, common side effects discussed.  Cannot titrate further due to ESRD.  3.  Muscle cramps, hopefully gabapentin will help with this also  4.  Multiple myeloma on Velcade and Decadron, followed by Dr. Earlie Server  5.  End stage renal disease on hemodialysis, followed by nephrology     Return to clinic in 2 months.   The duration of this appointment visit was 60 minutes of face-to-face time with the patient.  Greater than 50% of  this time was spent in counseling, explanation of diagnosis, planning of further management, and coordination of care.   Thank you for allowing me to participate in patient's care.  If I can answer any additional questions, I would be pleased to do so.    Sincerely,    Donika K. Posey Pronto, DO

## 2015-05-03 NOTE — Patient Instructions (Addendum)
1.  Start gabapentin 100mg  at bedtime.  This may help with your head pressure, tinging, and cramps. 2.  MRA head and neck, MRV 3.  OK to take tylenol or meclizine as needed for head discomfort 4.  Return to clinic in 2 months

## 2015-05-04 ENCOUNTER — Ambulatory Visit (HOSPITAL_BASED_OUTPATIENT_CLINIC_OR_DEPARTMENT_OTHER): Payer: PPO

## 2015-05-04 ENCOUNTER — Other Ambulatory Visit (HOSPITAL_BASED_OUTPATIENT_CLINIC_OR_DEPARTMENT_OTHER): Payer: PPO

## 2015-05-04 ENCOUNTER — Ambulatory Visit: Payer: PPO

## 2015-05-04 ENCOUNTER — Other Ambulatory Visit: Payer: PPO

## 2015-05-04 VITALS — BP 138/67 | HR 88 | Temp 98.3°F | Resp 18

## 2015-05-04 DIAGNOSIS — Z5112 Encounter for antineoplastic immunotherapy: Secondary | ICD-10-CM

## 2015-05-04 DIAGNOSIS — C9000 Multiple myeloma not having achieved remission: Secondary | ICD-10-CM

## 2015-05-04 LAB — CBC WITH DIFFERENTIAL/PLATELET
BASO%: 1.7 % (ref 0.0–2.0)
BASOS ABS: 0.1 10*3/uL (ref 0.0–0.1)
EOS%: 3.4 % (ref 0.0–7.0)
Eosinophils Absolute: 0.1 10*3/uL (ref 0.0–0.5)
HCT: 34.3 % — ABNORMAL LOW (ref 38.4–49.9)
HEMOGLOBIN: 11.5 g/dL — AB (ref 13.0–17.1)
LYMPH%: 21.7 % (ref 14.0–49.0)
MCH: 35.8 pg — AB (ref 27.2–33.4)
MCHC: 33.4 g/dL (ref 32.0–36.0)
MCV: 107 fL — ABNORMAL HIGH (ref 79.3–98.0)
MONO#: 0.5 10*3/uL (ref 0.1–0.9)
MONO%: 11.3 % (ref 0.0–14.0)
NEUT#: 2.7 10*3/uL (ref 1.5–6.5)
NEUT%: 61.9 % (ref 39.0–75.0)
Platelets: 145 10*3/uL (ref 140–400)
RBC: 3.21 10*6/uL — ABNORMAL LOW (ref 4.20–5.82)
RDW: 16 % — AB (ref 11.0–14.6)
WBC: 4.3 10*3/uL (ref 4.0–10.3)
lymph#: 0.9 10*3/uL (ref 0.9–3.3)

## 2015-05-04 LAB — COMPREHENSIVE METABOLIC PANEL (CC13)
ALBUMIN: 3.4 g/dL — AB (ref 3.5–5.0)
ALT: 12 U/L (ref 0–55)
AST: 20 U/L (ref 5–34)
Alkaline Phosphatase: 93 U/L (ref 40–150)
Anion Gap: 8 mEq/L (ref 3–11)
BUN: 10 mg/dL (ref 7.0–26.0)
CHLORIDE: 103 meq/L (ref 98–109)
CO2: 29 mEq/L (ref 22–29)
Calcium: 8.1 mg/dL — ABNORMAL LOW (ref 8.4–10.4)
Creatinine: 3.9 mg/dL (ref 0.7–1.3)
EGFR: 14 mL/min/{1.73_m2} — ABNORMAL LOW (ref >90)
GLUCOSE: 161 mg/dL — AB (ref 70–140)
POTASSIUM: 4.8 meq/L (ref 3.5–5.1)
SODIUM: 139 meq/L (ref 136–145)
Total Bilirubin: 1 mg/dL (ref 0.20–1.20)
Total Protein: 5.9 g/dL — ABNORMAL LOW (ref 6.4–8.3)

## 2015-05-04 MED ORDER — BORTEZOMIB CHEMO SQ INJECTION 3.5 MG (2.5MG/ML)
1.3000 mg/m2 | Freq: Once | INTRAMUSCULAR | Status: AC
Start: 2015-05-04 — End: 2015-05-04
  Administered 2015-05-04: 2.5 mg via SUBCUTANEOUS
  Filled 2015-05-04: qty 2.5

## 2015-05-04 MED ORDER — ONDANSETRON HCL 8 MG PO TABS
8.0000 mg | ORAL_TABLET | Freq: Once | ORAL | Status: AC
  Administered 2015-05-04: 8 mg via ORAL

## 2015-05-04 NOTE — Progress Notes (Signed)
OK to treat with today's labs per Ned Card, NP.

## 2015-05-04 NOTE — Patient Instructions (Signed)
[facility] Discharge Instructions for Patients Receiving Chemotherapy  Today you received the following chemotherapy agents:  Velcade  To help prevent nausea and vomiting after your treatment, we encourage you to take your nausea medication as prescribed.   If you develop nausea and vomiting that is not controlled by your nausea medication, call the clinic.   BELOW ARE SYMPTOMS THAT SHOULD BE REPORTED IMMEDIATELY:  *FEVER GREATER THAN 100.5 F  *CHILLS WITH OR WITHOUT FEVER  NAUSEA AND VOMITING THAT IS NOT CONTROLLED WITH YOUR NAUSEA MEDICATION  *UNUSUAL SHORTNESS OF BREATH  *UNUSUAL BRUISING OR BLEEDING  TENDERNESS IN MOUTH AND THROAT WITH OR WITHOUT PRESENCE OF ULCERS  *URINARY PROBLEMS  *BOWEL PROBLEMS  UNUSUAL RASH Items with * indicate a potential emergency and should be followed up as soon as possible.  Feel free to call the clinic you have any questions or concerns. The clinic phone number is [phone].  Please show the CHEMO ALERT CARD at check-in to the Emergency Department and triage nurse.   

## 2015-05-10 ENCOUNTER — Ambulatory Visit: Payer: PPO | Admitting: Nurse Practitioner

## 2015-05-11 ENCOUNTER — Ambulatory Visit (HOSPITAL_BASED_OUTPATIENT_CLINIC_OR_DEPARTMENT_OTHER): Payer: PPO

## 2015-05-11 ENCOUNTER — Other Ambulatory Visit (HOSPITAL_BASED_OUTPATIENT_CLINIC_OR_DEPARTMENT_OTHER): Payer: PPO

## 2015-05-11 ENCOUNTER — Ambulatory Visit (HOSPITAL_BASED_OUTPATIENT_CLINIC_OR_DEPARTMENT_OTHER): Payer: PPO | Admitting: Nurse Practitioner

## 2015-05-11 ENCOUNTER — Telehealth: Payer: Self-pay | Admitting: Nurse Practitioner

## 2015-05-11 VITALS — BP 113/51 | HR 81 | Temp 97.9°F | Resp 18 | Ht 69.0 in | Wt 184.4 lb

## 2015-05-11 DIAGNOSIS — C9000 Multiple myeloma not having achieved remission: Secondary | ICD-10-CM

## 2015-05-11 DIAGNOSIS — Z992 Dependence on renal dialysis: Secondary | ICD-10-CM

## 2015-05-11 DIAGNOSIS — N19 Unspecified kidney failure: Secondary | ICD-10-CM | POA: Diagnosis not present

## 2015-05-11 DIAGNOSIS — Z5112 Encounter for antineoplastic immunotherapy: Secondary | ICD-10-CM | POA: Diagnosis not present

## 2015-05-11 LAB — COMPREHENSIVE METABOLIC PANEL (CC13)
ALK PHOS: 91 U/L (ref 40–150)
ALT: 15 U/L (ref 0–55)
ANION GAP: 8 meq/L (ref 3–11)
AST: 19 U/L (ref 5–34)
Albumin: 3.5 g/dL (ref 3.5–5.0)
BUN: 16.8 mg/dL (ref 7.0–26.0)
CALCIUM: 8.5 mg/dL (ref 8.4–10.4)
CHLORIDE: 104 meq/L (ref 98–109)
CO2: 30 mEq/L — ABNORMAL HIGH (ref 22–29)
EGFR: 9 mL/min/{1.73_m2} — ABNORMAL LOW (ref >90)
Glucose: 144 mg/dl — ABNORMAL HIGH (ref 70–140)
POTASSIUM: 5.7 meq/L — AB (ref 3.5–5.1)
Sodium: 142 mEq/L (ref 136–145)
Total Bilirubin: 0.93 mg/dL (ref 0.20–1.20)
Total Protein: 5.9 g/dL — ABNORMAL LOW (ref 6.4–8.3)

## 2015-05-11 LAB — CBC WITH DIFFERENTIAL/PLATELET
BASO%: 0.8 % (ref 0.0–2.0)
Basophils Absolute: 0 10*3/uL (ref 0.0–0.1)
EOS%: 3.6 % (ref 0.0–7.0)
Eosinophils Absolute: 0.1 10*3/uL (ref 0.0–0.5)
HEMATOCRIT: 32.5 % — AB (ref 38.4–49.9)
HGB: 10.9 g/dL — ABNORMAL LOW (ref 13.0–17.1)
LYMPH%: 25 % (ref 14.0–49.0)
MCH: 35.9 pg — AB (ref 27.2–33.4)
MCHC: 33.5 g/dL (ref 32.0–36.0)
MCV: 106.9 fL — ABNORMAL HIGH (ref 79.3–98.0)
MONO#: 0.2 10*3/uL (ref 0.1–0.9)
MONO%: 6.1 % (ref 0.0–14.0)
NEUT#: 2.5 10*3/uL (ref 1.5–6.5)
NEUT%: 64.5 % (ref 39.0–75.0)
PLATELETS: 126 10*3/uL — AB (ref 140–400)
RBC: 3.04 10*6/uL — ABNORMAL LOW (ref 4.20–5.82)
RDW: 14.4 % (ref 11.0–14.6)
WBC: 3.9 10*3/uL — AB (ref 4.0–10.3)
lymph#: 1 10*3/uL (ref 0.9–3.3)

## 2015-05-11 MED ORDER — BORTEZOMIB CHEMO SQ INJECTION 3.5 MG (2.5MG/ML)
1.3000 mg/m2 | Freq: Once | INTRAMUSCULAR | Status: AC
  Administered 2015-05-11: 2.5 mg via SUBCUTANEOUS
  Filled 2015-05-11: qty 2.5

## 2015-05-11 MED ORDER — ONDANSETRON HCL 8 MG PO TABS
8.0000 mg | ORAL_TABLET | Freq: Once | ORAL | Status: AC
  Administered 2015-05-11: 8 mg via ORAL

## 2015-05-11 NOTE — Progress Notes (Signed)
  [place] [facility] OFFICE PROGRESS NOTE    DIAGNOSIS: Multiple myeloma with light chain nephropathy and renal failure diagnosed in June 2016  PRIOR THERAPY: Plasmapheresis daily for 7 days during his hospitalization at [facility] in June 2016.  CURRENT THERAPY: 1) hemodialysis on Monday, Wednesday and Friday weekly under the care of [facility]. 2) weekly subcutaneous Velcade 1.3 MG/M2 in addition to Decadron 40 mg by mouth weekly. Status post 13 cycles.  INTERVAL HISTORY:   Mr. Conry returns as scheduled. He continues weekly Velcade/dexamethasone. He completed cycle 13 on 05/04/2015. He denies significant nausea/vomiting. No mouth sores. He recently had a loose stool. He notes fatigue. He continues to experience intermittent insomnia. At times he notes mild erythema at the site of a Velcade injection. He continues dialysis on a Monday Wednesday Friday schedule.  Objective:  Vital signs in last 24 hours:  Blood pressure 113/51, pulse 81, temperature 97.9 F (36.6 C), temperature source Oral, resp. rate 18, height $RemoveBe'5\' 9"'FqimTpVWk$  (1.753 m), weight 184 lb 6.4 oz (83.643 kg), SpO2 97 %.    HEENT: No thrush or ulcers. Resp: Lungs clear bilaterally. Cardio: Regular rate and rhythm. GI: Abdomen soft and nontender. No organomegaly. Vascular: No leg edema. Neuro: Alert and oriented.  Skin: No rash.    Lab Results:  Lab Results  Component Value Date   WBC 3.9* 05/11/2015   HGB 10.9* 05/11/2015   HCT 32.5* 05/11/2015   MCV 106.9* 05/11/2015   PLT 126* 05/11/2015   NEUTROABS 2.5 05/11/2015    Imaging:  No results found.  Medications: I have reviewed the patient's current medications.  Assessment/Plan: 1. 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 Myeloma panel showed improvement in the kappa free light chains  04/13/2015. 2. Renal failure. He continues hemodialysis on a Monday Wednesday Friday schedule.  Disposition: Mr. Italiano appears stable. He has completed 13 cycles of weekly Velcade/dexamethasone. Plan to continue the same. He will return for a follow-up visit in 3 weeks for reevaluation. He will contact the office in the interim with any problems.    Ned Card ANP/GNP-BC   05/11/2015  3:07 PM

## 2015-05-11 NOTE — Progress Notes (Signed)
Pt spoke to his renal doctor re potassium and stated he has to go to have dialysis in am at 0600.

## 2015-05-11 NOTE — Telephone Encounter (Signed)
As per Owens Shark called [facility] and Spoke with Justin Woods the nurse who cared for Justin Woods for his dialysis. Informed her that potassium was elevated 5.7, she stated she would inform the physician and give Justin Woods a call.

## 2015-05-13 ENCOUNTER — Ambulatory Visit
Admission: RE | Admit: 2015-05-13 | Discharge: 2015-05-13 | Disposition: A | Discharge status: Home / Self Care | Payer: PPO | Source: Ambulatory Visit | Attending: Neurology | Admitting: Neurology

## 2015-05-13 DIAGNOSIS — R51 Headache: Principal | ICD-10-CM

## 2015-05-13 DIAGNOSIS — T451X5A Adverse effect of antineoplastic and immunosuppressive drugs, initial encounter: Secondary | ICD-10-CM

## 2015-05-13 DIAGNOSIS — G4486 Cervicogenic headache: Secondary | ICD-10-CM

## 2015-05-13 DIAGNOSIS — G620 Drug-induced polyneuropathy: Secondary | ICD-10-CM

## 2015-05-13 DIAGNOSIS — R519 Headache, unspecified: Secondary | ICD-10-CM

## 2015-05-14 ENCOUNTER — Telehealth: Payer: Self-pay | Admitting: Neurology

## 2015-05-14 NOTE — Telephone Encounter (Signed)
I attempted to contact patient via phone today regarding the results of MRA head and neck with brief message discussing chronic left vertebral occlusion previous seen on MRI brain and other large vessels were open, however there was no answer so a message was left for the patient to return my call.   Niani Mourer K. Posey Pronto, DO

## 2015-05-14 NOTE — Telephone Encounter (Signed)
Please advise 

## 2015-05-14 NOTE — Telephone Encounter (Signed)
Pt wife called [facility] would like to know the results of the MRI that were done on 05-13-15 please call [phone] and you may leave a message she said

## 2015-05-17 ENCOUNTER — Ambulatory Visit
Admission: RE | Admit: 2015-05-17 | Discharge: 2015-05-17 | Disposition: A | Discharge status: Home / Self Care | Payer: PPO | Source: Ambulatory Visit | Attending: Neurology | Admitting: Neurology

## 2015-05-17 ENCOUNTER — Ambulatory Visit: Payer: PPO | Admitting: Neurology

## 2015-05-17 DIAGNOSIS — T451X5A Adverse effect of antineoplastic and immunosuppressive drugs, initial encounter: Secondary | ICD-10-CM

## 2015-05-17 DIAGNOSIS — R519 Headache, unspecified: Secondary | ICD-10-CM

## 2015-05-17 DIAGNOSIS — R51 Headache: Principal | ICD-10-CM

## 2015-05-17 DIAGNOSIS — G620 Drug-induced polyneuropathy: Secondary | ICD-10-CM

## 2015-05-17 DIAGNOSIS — G4486 Cervicogenic headache: Secondary | ICD-10-CM

## 2015-05-18 ENCOUNTER — Ambulatory Visit: Payer: PPO

## 2015-05-18 ENCOUNTER — Ambulatory Visit (HOSPITAL_BASED_OUTPATIENT_CLINIC_OR_DEPARTMENT_OTHER): Payer: PPO

## 2015-05-18 ENCOUNTER — Other Ambulatory Visit (HOSPITAL_BASED_OUTPATIENT_CLINIC_OR_DEPARTMENT_OTHER): Payer: PPO

## 2015-05-18 VITALS — BP 137/60 | HR 77 | Temp 97.6°F | Resp 20

## 2015-05-18 DIAGNOSIS — C9000 Multiple myeloma not having achieved remission: Secondary | ICD-10-CM

## 2015-05-18 DIAGNOSIS — Z5112 Encounter for antineoplastic immunotherapy: Secondary | ICD-10-CM

## 2015-05-18 LAB — CBC WITH DIFFERENTIAL/PLATELET
BASO%: 1.5 % (ref 0.0–2.0)
BASOS ABS: 0.1 10*3/uL (ref 0.0–0.1)
EOS ABS: 0.1 10*3/uL (ref 0.0–0.5)
EOS%: 1.5 % (ref 0.0–7.0)
HCT: 33.6 % — ABNORMAL LOW (ref 38.4–49.9)
HEMOGLOBIN: 11.4 g/dL — AB (ref 13.0–17.1)
LYMPH%: 12.7 % — AB (ref 14.0–49.0)
MCH: 35.9 pg — AB (ref 27.2–33.4)
MCHC: 33.9 g/dL (ref 32.0–36.0)
MCV: 105.7 fL — AB (ref 79.3–98.0)
MONO#: 0.2 10*3/uL (ref 0.1–0.9)
MONO%: 3.2 % (ref 0.0–14.0)
NEUT#: 4.3 10*3/uL (ref 1.5–6.5)
NEUT%: 81.1 % — ABNORMAL HIGH (ref 39.0–75.0)
PLATELETS: 142 10*3/uL (ref 140–400)
RBC: 3.17 10*6/uL — AB (ref 4.20–5.82)
RDW: 14.9 % — ABNORMAL HIGH (ref 11.0–14.6)
WBC: 5.3 10*3/uL (ref 4.0–10.3)
lymph#: 0.7 10*3/uL — ABNORMAL LOW (ref 0.9–3.3)

## 2015-05-18 LAB — COMPREHENSIVE METABOLIC PANEL (CC13)
ALBUMIN: 3.6 g/dL (ref 3.5–5.0)
ALK PHOS: 89 U/L (ref 40–150)
ALT: 15 U/L (ref 0–55)
ANION GAP: 8 meq/L (ref 3–11)
AST: 21 U/L (ref 5–34)
BILIRUBIN TOTAL: 0.87 mg/dL (ref 0.20–1.20)
BUN: 13.5 mg/dL (ref 7.0–26.0)
CO2: 30 mEq/L — ABNORMAL HIGH (ref 22–29)
Calcium: 8.5 mg/dL (ref 8.4–10.4)
Chloride: 99 mEq/L (ref 98–109)
Creatinine: 5 mg/dL (ref 0.7–1.3)
EGFR: 10 mL/min/{1.73_m2} — AB (ref >90)
GLUCOSE: 164 mg/dL — AB (ref 70–140)
POTASSIUM: 4.4 meq/L (ref 3.5–5.1)
SODIUM: 137 meq/L (ref 136–145)
Total Protein: 6.1 g/dL — ABNORMAL LOW (ref 6.4–8.3)

## 2015-05-18 MED ORDER — BORTEZOMIB CHEMO SQ INJECTION 3.5 MG (2.5MG/ML)
1.3000 mg/m2 | Freq: Once | INTRAMUSCULAR | Status: AC
Start: 2015-05-18 — End: 2015-05-18
  Administered 2015-05-18: 2.5 mg via SUBCUTANEOUS
  Filled 2015-05-18: qty 2.5

## 2015-05-18 MED ORDER — ONDANSETRON HCL 8 MG PO TABS
ORAL_TABLET | ORAL | Status: AC
  Filled 2015-05-18: qty 1

## 2015-05-18 MED ORDER — ONDANSETRON HCL 8 MG PO TABS
8.0000 mg | ORAL_TABLET | Freq: Once | ORAL | Status: AC
  Administered 2015-05-18: 8 mg via ORAL

## 2015-05-18 NOTE — Progress Notes (Signed)
Per Mohamed it is okay to treat pt with chemo today and today's labs. 

## 2015-05-18 NOTE — Patient Instructions (Signed)
[facility] Discharge Instructions for Patients Receiving Chemotherapy  Today you received the following chemotherapy agents velcade.  To help prevent nausea and vomiting after your treatment, we encourage you to take your nausea medication .   If you develop nausea and vomiting that is not controlled by your nausea medication, call the clinic.   BELOW ARE SYMPTOMS THAT SHOULD BE REPORTED IMMEDIATELY:  *FEVER GREATER THAN 100.5 F  *CHILLS WITH OR WITHOUT FEVER  NAUSEA AND VOMITING THAT IS NOT CONTROLLED WITH YOUR NAUSEA MEDICATION  *UNUSUAL SHORTNESS OF BREATH  *UNUSUAL BRUISING OR BLEEDING  TENDERNESS IN MOUTH AND THROAT WITH OR WITHOUT PRESENCE OF ULCERS  *URINARY PROBLEMS  *BOWEL PROBLEMS  UNUSUAL RASH Items with * indicate a potential emergency and should be followed up as soon as possible.  Feel free to call the clinic you have any questions or concerns. The clinic phone number is [phone].  Please show the CHEMO ALERT CARD at check-in to the Emergency Department and triage nurse.   

## 2015-05-25 ENCOUNTER — Other Ambulatory Visit (HOSPITAL_BASED_OUTPATIENT_CLINIC_OR_DEPARTMENT_OTHER): Payer: PPO

## 2015-05-25 ENCOUNTER — Ambulatory Visit (HOSPITAL_BASED_OUTPATIENT_CLINIC_OR_DEPARTMENT_OTHER): Payer: PPO

## 2015-05-25 VITALS — BP 138/63 | HR 75 | Temp 97.1°F | Resp 20

## 2015-05-25 DIAGNOSIS — Z5112 Encounter for antineoplastic immunotherapy: Secondary | ICD-10-CM | POA: Diagnosis not present

## 2015-05-25 DIAGNOSIS — C9000 Multiple myeloma not having achieved remission: Secondary | ICD-10-CM

## 2015-05-25 LAB — COMPREHENSIVE METABOLIC PANEL (CC13)
ALT: 15 U/L (ref 0–55)
AST: 20 U/L (ref 5–34)
Albumin: 3.4 g/dL — ABNORMAL LOW (ref 3.5–5.0)
Alkaline Phosphatase: 95 U/L (ref 40–150)
Anion Gap: 12 mEq/L — ABNORMAL HIGH (ref 3–11)
BUN: 12.8 mg/dL (ref 7.0–26.0)
CHLORIDE: 101 meq/L (ref 98–109)
CO2: 26 meq/L (ref 22–29)
CREATININE: 4.3 mg/dL — AB (ref 0.7–1.3)
Calcium: 8.3 mg/dL — ABNORMAL LOW (ref 8.4–10.4)
EGFR: 12 mL/min/{1.73_m2} — ABNORMAL LOW (ref >90)
GLUCOSE: 161 mg/dL — AB (ref 70–140)
Potassium: 3.8 mEq/L (ref 3.5–5.1)
SODIUM: 138 meq/L (ref 136–145)
Total Bilirubin: 0.77 mg/dL (ref 0.20–1.20)
Total Protein: 6 g/dL — ABNORMAL LOW (ref 6.4–8.3)

## 2015-05-25 LAB — CBC WITH DIFFERENTIAL/PLATELET
BASO%: 1.2 % (ref 0.0–2.0)
Basophils Absolute: 0.1 10*3/uL (ref 0.0–0.1)
EOS ABS: 0.1 10*3/uL (ref 0.0–0.5)
EOS%: 2.3 % (ref 0.0–7.0)
HCT: 30.3 % — ABNORMAL LOW (ref 38.4–49.9)
HEMOGLOBIN: 10.2 g/dL — AB (ref 13.0–17.1)
LYMPH%: 12.8 % — ABNORMAL LOW (ref 14.0–49.0)
MCH: 35.7 pg — ABNORMAL HIGH (ref 27.2–33.4)
MCHC: 33.8 g/dL (ref 32.0–36.0)
MCV: 105.7 fL — AB (ref 79.3–98.0)
MONO#: 0.2 10*3/uL (ref 0.1–0.9)
MONO%: 4.3 % (ref 0.0–14.0)
NEUT%: 79.4 % — ABNORMAL HIGH (ref 39.0–75.0)
NEUTROS ABS: 4.2 10*3/uL (ref 1.5–6.5)
PLATELETS: 139 10*3/uL — AB (ref 140–400)
RBC: 2.87 10*6/uL — ABNORMAL LOW (ref 4.20–5.82)
RDW: 14.7 % — AB (ref 11.0–14.6)
WBC: 5.2 10*3/uL (ref 4.0–10.3)
lymph#: 0.7 10*3/uL — ABNORMAL LOW (ref 0.9–3.3)

## 2015-05-25 MED ORDER — BORTEZOMIB CHEMO SQ INJECTION 3.5 MG (2.5MG/ML)
1.3000 mg/m2 | Freq: Once | INTRAMUSCULAR | Status: AC
  Administered 2015-05-25: 2.5 mg via SUBCUTANEOUS
  Filled 2015-05-25: qty 2.5

## 2015-05-25 MED ORDER — ONDANSETRON HCL 8 MG PO TABS
ORAL_TABLET | ORAL | Status: AC
  Filled 2015-05-25: qty 1

## 2015-05-25 MED ORDER — ONDANSETRON HCL 8 MG PO TABS
8.0000 mg | ORAL_TABLET | Freq: Once | ORAL | Status: AC
  Administered 2015-05-25: 8 mg via ORAL

## 2015-05-25 NOTE — Patient Instructions (Signed)
[facility] Discharge Instructions for Patients Receiving Chemotherapy  Today you received the following chemotherapy agents Velcade  To help prevent nausea and vomiting after your treatment, we encourage you to take your nausea medication    If you develop nausea and vomiting that is not controlled by your nausea medication, call the clinic.   BELOW ARE SYMPTOMS THAT SHOULD BE REPORTED IMMEDIATELY:  *FEVER GREATER THAN 100.5 F  *CHILLS WITH OR WITHOUT FEVER  NAUSEA AND VOMITING THAT IS NOT CONTROLLED WITH YOUR NAUSEA MEDICATION  *UNUSUAL SHORTNESS OF BREATH  *UNUSUAL BRUISING OR BLEEDING  TENDERNESS IN MOUTH AND THROAT WITH OR WITHOUT PRESENCE OF ULCERS  *URINARY PROBLEMS  *BOWEL PROBLEMS  UNUSUAL RASH Items with * indicate a potential emergency and should be followed up as soon as possible.  Feel free to call the clinic you have any questions or concerns. The clinic phone number is [phone].  Please show the CHEMO ALERT CARD at check-in to the Emergency Department and triage nurse.   

## 2015-05-29 ENCOUNTER — Other Ambulatory Visit: Payer: Self-pay | Admitting: Internal Medicine

## 2015-05-29 ENCOUNTER — Ambulatory Visit
Admission: RE | Admit: 2015-05-29 | Discharge: 2015-05-29 | Disposition: A | Discharge status: Home / Self Care | Payer: PPO | Source: Ambulatory Visit | Attending: Internal Medicine | Admitting: Internal Medicine

## 2015-05-29 ENCOUNTER — Telehealth: Payer: Self-pay | Admitting: *Deleted

## 2015-05-29 DIAGNOSIS — C9000 Multiple myeloma not having achieved remission: Secondary | ICD-10-CM

## 2015-05-29 DIAGNOSIS — M545 Low back pain: Secondary | ICD-10-CM

## 2015-05-29 NOTE — Telephone Encounter (Signed)
Received vm message from pt's wife @ 9:59 am regarding decadron schedule. Pt is scheduled for labs and Dr. Julien Nordmann on this Thursday, 05/31/15 and then has his Velcade on Friday, 06/01/15. Wife just wanted to confirm that pt takes his decadron the day of his chemo.  TC back to wife and left message to confirm this.

## 2015-05-29 NOTE — Telephone Encounter (Signed)
Received call back from pt's wife. To confirm when her husband needs to take his steroids.  She also wanted to let Dr. Julien Nordmann know that pt has been to see his primary care MD this morning because of back pain. He had an xray which revealed 2 new compression fractures @ L1 and L2. He was prescribed hydrocodone for pain at this time.  Mrs. Clayburn wanted to make sure Dr. Julien Nordmann was a ware of this. She will be bringing the xray report to his appointment on Thursday.

## 2015-05-31 ENCOUNTER — Encounter: Payer: Self-pay | Admitting: Internal Medicine

## 2015-05-31 ENCOUNTER — Ambulatory Visit (HOSPITAL_BASED_OUTPATIENT_CLINIC_OR_DEPARTMENT_OTHER): Payer: PPO | Admitting: Internal Medicine

## 2015-05-31 ENCOUNTER — Other Ambulatory Visit (HOSPITAL_BASED_OUTPATIENT_CLINIC_OR_DEPARTMENT_OTHER): Payer: PPO

## 2015-05-31 ENCOUNTER — Telehealth: Payer: Self-pay | Admitting: Internal Medicine

## 2015-05-31 VITALS — BP 125/54 | HR 76 | Temp 98.1°F | Resp 19 | Ht 69.0 in | Wt 185.0 lb

## 2015-05-31 DIAGNOSIS — Z992 Dependence on renal dialysis: Secondary | ICD-10-CM

## 2015-05-31 DIAGNOSIS — C9000 Multiple myeloma not having achieved remission: Secondary | ICD-10-CM

## 2015-05-31 DIAGNOSIS — N289 Disorder of kidney and ureter, unspecified: Secondary | ICD-10-CM

## 2015-05-31 DIAGNOSIS — M545 Low back pain: Secondary | ICD-10-CM | POA: Diagnosis not present

## 2015-05-31 DIAGNOSIS — N179 Acute kidney failure, unspecified: Secondary | ICD-10-CM

## 2015-05-31 LAB — CBC WITH DIFFERENTIAL/PLATELET
BASO%: 1.1 % (ref 0.0–2.0)
Basophils Absolute: 0.1 10*3/uL (ref 0.0–0.1)
EOS%: 2.7 % (ref 0.0–7.0)
Eosinophils Absolute: 0.1 10*3/uL (ref 0.0–0.5)
HEMATOCRIT: 29.5 % — AB (ref 38.4–49.9)
HGB: 10 g/dL — ABNORMAL LOW (ref 13.0–17.1)
LYMPH#: 1.5 10*3/uL (ref 0.9–3.3)
LYMPH%: 29.9 % (ref 14.0–49.0)
MCH: 35.9 pg — ABNORMAL HIGH (ref 27.2–33.4)
MCHC: 34 g/dL (ref 32.0–36.0)
MCV: 105.7 fL — ABNORMAL HIGH (ref 79.3–98.0)
MONO#: 0.9 10*3/uL (ref 0.1–0.9)
MONO%: 18.2 % — ABNORMAL HIGH (ref 0.0–14.0)
NEUT%: 48.1 % (ref 39.0–75.0)
NEUTROS ABS: 2.3 10*3/uL (ref 1.5–6.5)
PLATELETS: 134 10*3/uL — AB (ref 140–400)
RBC: 2.79 10*6/uL — ABNORMAL LOW (ref 4.20–5.82)
RDW: 14.7 % — ABNORMAL HIGH (ref 11.0–14.6)
WBC: 4.9 10*3/uL (ref 4.0–10.3)

## 2015-05-31 LAB — COMPREHENSIVE METABOLIC PANEL (CC13)
ALT: 16 U/L (ref 0–55)
ANION GAP: 9 meq/L (ref 3–11)
AST: 19 U/L (ref 5–34)
Albumin: 3.4 g/dL — ABNORMAL LOW (ref 3.5–5.0)
Alkaline Phosphatase: 84 U/L (ref 40–150)
BILIRUBIN TOTAL: 0.91 mg/dL (ref 0.20–1.20)
BUN: 23.8 mg/dL (ref 7.0–26.0)
CALCIUM: 9 mg/dL (ref 8.4–10.4)
CO2: 29 mEq/L (ref 22–29)
CREATININE: 6.6 mg/dL — AB (ref 0.7–1.3)
Chloride: 101 mEq/L (ref 98–109)
EGFR: 7 mL/min/{1.73_m2} — ABNORMAL LOW (ref >90)
Glucose: 83 mg/dl (ref 70–140)
Potassium: 4.6 mEq/L (ref 3.5–5.1)
Sodium: 139 mEq/L (ref 136–145)
TOTAL PROTEIN: 5.9 g/dL — AB (ref 6.4–8.3)

## 2015-05-31 NOTE — Telephone Encounter (Signed)
Gave and printed appt scehd and avs for pt for OCT thru [place]

## 2015-05-31 NOTE — Progress Notes (Signed)
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  CURRENT THERAPY: 1) hemodialysis on Monday, Wednesday and Friday weekly under the care of [place]. 2) weekly subcutaneous Velcade 1.3 MG/M2 in addition to Decadron 40 mg by mouth weekly. Status post 16 cycles.  INTERVAL HISTORY: Justin Woods 81 y.o. male returns to the clinic today for follow-up visit accompanied by his wife. The patient is tolerating his current treatment with subcutaneous Velcade and Decadron fairly well with no significant adverse effects. He continues his dialysis on Monday, Wednesday and Friday weekly. He recently complained of low back pain and he was seen by his primary care physician Dr. Deforest Hoyles and had x-ray of the lumbar spine performed and it showed mid vertebral body compression deformities at L1 and L2 that could represent recent fractures. He denied having any significant nausea or vomiting, no fever or chills. The patient denied having any significant chest pain, shortness breath, cough or hemoptysis. He has some ecchymosis in the left forearm secondary to recent hemodialysis.  MEDICAL HISTORY: Past Medical History  Diagnosis Date  . Hypertension   . COPD (chronic obstructive pulmonary disease) ([place])   . Asthma   . Chronic kidney disease   . History of kidney stones   . History of hiatal hernia   . Headache     HISTORY OF CLUSTER HEADACHES  . Arthritis   . Complication of anesthesia     TROUBLE WAKING UP  . H/O colonoscopy with polypectomy   . Allergic rhinitis   . DVT (deep venous thrombosis) ([facility])     RIGHT CALF AFTER A LITHOTRIPSY  . BPH (benign  prostatic hypertrophy)   . Osteoarthritis   . Emphysema of lung ([place])     ALLERGIES:  has No Known Allergies.  MEDICATIONS:  Current Outpatient Prescriptions  Medication Sig Dispense Refill  . acyclovir (ZOVIRAX) 400 MG tablet Take 1 tablet (400 mg total) by mouth 2 (two) times daily. 60 tablet 3  . amLODipine (NORVASC) 5 MG tablet Take 5 mg by mouth daily.    . bisacodyl (DULCOLAX) 5 MG EC tablet Take 1 tablet (5 mg total) by mouth daily as needed for moderate constipation. 30 tablet 0  . Bortezomib (VELCADE IJ) Inject as directed.    . budesonide-formoterol (SYMBICORT) 80-4.5 MCG/ACT inhaler Inhale 2 puffs into the lungs 2 (two) times daily.    . calcium carbonate (TUMS - DOSED IN MG ELEMENTAL CALCIUM) 500 MG chewable tablet Chew 1 tablet by mouth 3 (three) times daily.    . fluticasone (FLONASE) 50 MCG/ACT nasal spray Place 1 spray into both nostrils daily.    . meclizine (ANTIVERT) 25 MG tablet Take 1 tablet (25 mg total) by mouth 3 (three) times daily as needed for dizziness. 30 tablet 0  . metoprolol succinate (TOPROL-XL) 25 MG 24 hr tablet Take 25 mg by mouth daily.    Marland Kitchen omeprazole (PRILOSEC) 40 MG capsule Take 40 mg by mouth daily.    Marland Kitchen oxycodone (OXY-IR) 5 MG capsule Take 5 mg by mouth every 4 (four) hours as needed.    Marland Kitchen dexamethasone (DECADRON) 4 MG tablet Take 10 tablets (40 mg total) by mouth once a  week. (Patient not taking: Reported on 05/31/2015) 40 tablet 4  . gabapentin (NEURONTIN) 100 MG capsule Take 1 capsule (100 mg total) by mouth at bedtime. (Patient not taking: Reported on 05/31/2015) 30 capsule 5  . prochlorperazine (COMPAZINE) 10 MG tablet Take 1 tablet (10 mg total) by mouth every 6 (six) hours as needed for nausea or vomiting. (Patient not taking: Reported on 05/31/2015) 30 tablet 0   No current facility-administered medications for this visit.    SURGICAL HISTORY:  Past Surgical History  Procedure Laterality Date  . Lithotripsy    . Back surgery    .  Arthroscopy right knee    . Cholecystectomy    . Skin cancer removed from ears    . Insertion of dialysis catheter Right 02/07/2015    Procedure: INSERTION OF DIALYSIS CATHETER;  Surgeon: Larina Earthly, MD;  Location: [facility] OR;  Service: Vascular;  Laterality: Right;  . Av fistula placement Left 02/07/2015    Procedure: ARTERIOVENOUS (AV) FISTULA CREATION;  Surgeon: Larina Earthly, MD;  Location: [facility] OR;  Service: Vascular;  Laterality: Left;  . Removal of a dialysis catheter Left 02/07/2015    Procedure: Removal of  Temporary catheter in left neck;  Surgeon: Larina Earthly, MD;  Location: [facility] OR;  Service: Vascular;  Laterality: Left;  . Back surgery      1969    REVIEW OF SYSTEMS:  Constitutional: positive for fatigue Eyes: negative Ears, nose, mouth, throat, and face: negative Respiratory: negative Cardiovascular: negative Gastrointestinal: negative Genitourinary:negative Integument/breast: negative Hematologic/lymphatic: negative Musculoskeletal:negative Neurological: negative Behavioral/Psych: negative Endocrine: negative Allergic/Immunologic: negative   PHYSICAL EXAMINATION: General appearance: alert, cooperative, fatigued and no distress Head: Normocephalic, without obvious abnormality, atraumatic Neck: no adenopathy, no JVD, supple, symmetrical, trachea midline and thyroid not enlarged, symmetric, no tenderness/mass/nodules Lymph nodes: Cervical, supraclavicular, and axillary nodes normal. Resp: clear to auscultation bilaterally Back: symmetric, no curvature. ROM normal. No CVA tenderness. Cardio: regular rate and rhythm, S1, S2 normal, no murmur, click, rub or gallop GI: soft, non-tender; bowel sounds normal; no masses,  no organomegaly Extremities: extremities normal, atraumatic, no cyanosis or edema Neurologic: Alert and oriented X 3, normal strength and tone. Normal symmetric reflexes. Normal coordination and gait  ECOG PERFORMANCE STATUS: 1 - Symptomatic but completely  ambulatory  Blood pressure 125/54, pulse 76, temperature 98.1 F (36.7 C), temperature source Oral, resp. rate 19, height 5\' 9"  (1.753 m), weight 185 lb (83.915 kg), SpO2 97 %.  LABORATORY DATA: Lab Results  Component Value Date   WBC 4.9 05/31/2015   HGB 10.0* 05/31/2015   HCT 29.5* 05/31/2015   MCV 105.7* 05/31/2015   PLT 134* 05/31/2015      Chemistry      Component Value Date/Time   NA 138 05/25/2015 1427   NA 139 03/05/2015 0548   K 3.8 05/25/2015 1427   K 3.7 03/05/2015 0548   CL 100* 03/05/2015 0548   CO2 26 05/25/2015 1427   CO2 23 03/05/2015 0520   BUN 12.8 05/25/2015 1427   BUN 60* 03/05/2015 0548   CREATININE 4.3* 05/25/2015 1427   CREATININE 12.70* 03/05/2015 0548      Component Value Date/Time   CALCIUM 8.3* 05/25/2015 1427   CALCIUM 9.0 03/05/2015 0520   ALKPHOS 95 05/25/2015 1427   ALKPHOS 64 03/05/2015 0520   AST 20 05/25/2015 1427   AST 23 03/05/2015 0520   ALT 15 05/25/2015 1427   ALT 18 03/05/2015 0520   BILITOT 0.77 05/25/2015 1427  BILITOT 0.7 03/05/2015 0520       RADIOGRAPHIC STUDIES: Dg Lumbar Spine 2-3 Views  05/29/2015  CLINICAL DATA:  Low back pain.  Myeloma EXAM: LUMBAR SPINE - 2-3 VIEW COMPARISON:  CT abdomen pelvis 01/30/2015 FINDINGS: Anterior wedging of L4 is chronic and unchanged. Mild anterior wedging of L2 has developed since the prior study and could be acute. Mild depression of the L1 superior endplate also has developed since the prior study. Dextroscoliosis at L4.  Disc degeneration at L3-4 L4-5 and L5-S1. No lytic or sclerotic bone lesion. IMPRESSION: Mild vertebral body compression deformities at L1 and L2 have developed since the prior study and could represent recent fracture. Correlate with symptoms. MRI may be helpful for further evaluation Chronic compression of L4 is stable. Lumbar disc degeneration at L4-5 and L5-S1. Electronically Signed   By: Franchot Gallo M.D.   On: 05/29/2015 11:58   Mr Virgel Paling Wo  Contrast  05/13/2015  CLINICAL DATA:  81 year old hypertensive male with myeloma (ongoing chemotherapy) presenting with body tingling, dizziness and paresthesias of the feet. Renal failure. On dialysis. Subsequent encounter. EXAM: MRA HEAD WITHOUT CONTRAST MRA NECK WITHOUT CONTRAST TECHNIQUE: Angiographic images of the Circle of Willis were obtained using MRA technique without intravenous contrast. Angiographic images of the neck were obtained using MRA technique without intravenous contrast. Carotid stenosis measurements (when applicable) are obtained utilizing NASCET criteria, using the distal internal carotid diameter as the denominator. COMPARISON:  03/05/2015 brain MR. FINDINGS: MRA HEAD FINDINGS Occluded left vertebral artery. Minimal amount of flow within the distal left vertebral artery felt to be related to retrograde flow. No significant stenosis of the right vertebral artery. Neither posterior inferior cerebellar artery is visualized. Nonvisualized left anterior inferior cerebellar artery. Mild narrowing distal basilar artery. Narrowing proximal left superior cerebellar artery. Narrowing posterior cerebral artery distal branches more notable on the left. Mild narrowing cavernous segment left internal carotid artery. Anterior circulation without medium or large size vessel significant stenosis or occlusion. Middle cerebral artery branch vessel irregularity with mild narrowing bilaterally. No aneurysm noted. MRA NECK FINDINGS Left vertebral artery is occluded. Prominent size right vertebral artery without significant stenosis. Artifact extends through the proximal aspect. No significant carotid bifurcation narrowing. Ectatic common carotid arteries. Three vessel aortic arch. IMPRESSION: MRA HEAD Occluded left vertebral artery. Minimal amount of flow within the distal left vertebral artery felt to be related to retrograde flow. Neither posterior inferior cerebellar artery is visualized. Nonvisualized left  anterior inferior cerebellar artery. Mild narrowing distal basilar artery. Narrowing proximal left superior cerebellar artery. Narrowing posterior cerebral artery distal branches more notable on the left. Anterior circulation without medium or large size vessel significant stenosis or occlusion. Middle cerebral artery branch vessel irregularity with mild narrowing bilaterally. MRA NECK Left vertebral artery is occluded. Prominent size right vertebral artery without significant stenosis. Artifact extends through the proximal aspect. No significant carotid bifurcation narrowing on either side. Electronically Signed   By: Genia Del M.D.   On: 05/13/2015 11:39   Mr Angiogram Neck Wo Contrast  05/13/2015  CLINICAL DATA:  81 year old hypertensive male with myeloma (ongoing chemotherapy) presenting with body tingling, dizziness and paresthesias of the feet. Renal failure. On dialysis. Subsequent encounter. EXAM: MRA HEAD WITHOUT CONTRAST MRA NECK WITHOUT CONTRAST TECHNIQUE: Angiographic images of the Circle of Willis were obtained using MRA technique without intravenous contrast. Angiographic images of the neck were obtained using MRA technique without intravenous contrast. Carotid stenosis measurements (when applicable) are obtained utilizing NASCET criteria, using the distal  internal carotid diameter as the denominator. COMPARISON:  03/05/2015 brain MR. FINDINGS: MRA HEAD FINDINGS Occluded left vertebral artery. Minimal amount of flow within the distal left vertebral artery felt to be related to retrograde flow. No significant stenosis of the right vertebral artery. Neither posterior inferior cerebellar artery is visualized. Nonvisualized left anterior inferior cerebellar artery. Mild narrowing distal basilar artery. Narrowing proximal left superior cerebellar artery. Narrowing posterior cerebral artery distal branches more notable on the left. Mild narrowing cavernous segment left internal carotid artery.  Anterior circulation without medium or large size vessel significant stenosis or occlusion. Middle cerebral artery branch vessel irregularity with mild narrowing bilaterally. No aneurysm noted. MRA NECK FINDINGS Left vertebral artery is occluded. Prominent size right vertebral artery without significant stenosis. Artifact extends through the proximal aspect. No significant carotid bifurcation narrowing. Ectatic common carotid arteries. Three vessel aortic arch. IMPRESSION: MRA HEAD Occluded left vertebral artery. Minimal amount of flow within the distal left vertebral artery felt to be related to retrograde flow. Neither posterior inferior cerebellar artery is visualized. Nonvisualized left anterior inferior cerebellar artery. Mild narrowing distal basilar artery. Narrowing proximal left superior cerebellar artery. Narrowing posterior cerebral artery distal branches more notable on the left. Anterior circulation without medium or large size vessel significant stenosis or occlusion. Middle cerebral artery branch vessel irregularity with mild narrowing bilaterally. MRA NECK Left vertebral artery is occluded. Prominent size right vertebral artery without significant stenosis. Artifact extends through the proximal aspect. No significant carotid bifurcation narrowing on either side. Electronically Signed   By: Genia Del M.D.   On: 05/13/2015 11:39   Mr Mrv Head Wo Cm  05/17/2015  CLINICAL DATA:  Headache.  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 EXAM: MR MRV HEAD WITHOUT CONTRAST TECHNIQUE: Multiplanar, multisequence MR imaging was performed. No intravenous contrast was administered. COMPARISON:  MRI head 03/05/2015 FINDINGS: Major dural sinuses are patent. Superior sagittal sinus is normal. Internal cerebral veins and straight sinus are normal. Transverse and sigmoid sinus normal bilaterally. IMPRESSION: Negative Electronically Signed   By: Franchot Gallo M.D.   On: 05/17/2015 19:46    ASSESSMENT AND PLAN: This  is a very pleasant 81 years old white male recently diagnosed with multiple myeloma as well as kappa light chain nephropathy. The patient is currently undergoing hemodialysis on Monday Wednesday and Friday. He is currently on treatment with subcutaneous Velcade and Decadron and tolerating it well. Status post 16 cycles. I recommended for the patient to proceed with his treatment as scheduled. He will start cycle #17 tomorrow. He would come back for follow-up visit in 3 weeks for reevaluation and management of any adverse effect of his treatment. For the low back pain, the patient will continue with his pain medication as prescribed by Dr. Deforest Hoyles The patient was advised to call immediately if he has any concerning symptoms in the interval. The patient voices understanding of current disease status and treatment options and is in agreement with the current care plan.  All questions were answered. The patient knows to call the clinic with any problems, questions or concerns. We can certainly see the patient much sooner if necessary.  Disclaimer: This note was dictated with voice recognition software. Similar sounding words can inadvertently be transcribed and may not be corrected upon review.

## 2015-06-01 ENCOUNTER — Ambulatory Visit (HOSPITAL_BASED_OUTPATIENT_CLINIC_OR_DEPARTMENT_OTHER): Payer: PPO

## 2015-06-01 VITALS — BP 144/63 | HR 89 | Temp 97.5°F

## 2015-06-01 DIAGNOSIS — Z5112 Encounter for antineoplastic immunotherapy: Secondary | ICD-10-CM

## 2015-06-01 DIAGNOSIS — C9000 Multiple myeloma not having achieved remission: Secondary | ICD-10-CM | POA: Diagnosis not present

## 2015-06-01 MED ORDER — ONDANSETRON HCL 8 MG PO TABS
8.0000 mg | ORAL_TABLET | Freq: Once | ORAL | Status: AC
Start: 2015-06-01 — End: 2015-06-01
  Administered 2015-06-01: 8 mg via ORAL

## 2015-06-01 MED ORDER — ONDANSETRON HCL 8 MG PO TABS
ORAL_TABLET | ORAL | Status: AC
  Filled 2015-06-01: qty 1

## 2015-06-01 MED ORDER — BORTEZOMIB CHEMO SQ INJECTION 3.5 MG (2.5MG/ML)
1.3000 mg/m2 | Freq: Once | INTRAMUSCULAR | Status: AC
Start: 2015-06-01 — End: 2015-06-01
  Administered 2015-06-01: 2.5 mg via SUBCUTANEOUS
  Filled 2015-06-01: qty 2.5

## 2015-06-01 NOTE — Patient Instructions (Signed)
[facility] Discharge Instructions for Patients Receiving Chemotherapy  Today you received the following chemotherapy agents:  Velcade  To help prevent nausea and vomiting after your treatment, we encourage you to take your nausea medication as prescribed.   If you develop nausea and vomiting that is not controlled by your nausea medication, call the clinic.   BELOW ARE SYMPTOMS THAT SHOULD BE REPORTED IMMEDIATELY:  *FEVER GREATER THAN 100.5 F  *CHILLS WITH OR WITHOUT FEVER  NAUSEA AND VOMITING THAT IS NOT CONTROLLED WITH YOUR NAUSEA MEDICATION  *UNUSUAL SHORTNESS OF BREATH  *UNUSUAL BRUISING OR BLEEDING  TENDERNESS IN MOUTH AND THROAT WITH OR WITHOUT PRESENCE OF ULCERS  *URINARY PROBLEMS  *BOWEL PROBLEMS  UNUSUAL RASH Items with * indicate a potential emergency and should be followed up as soon as possible.  Feel free to call the clinic you have any questions or concerns. The clinic phone number is [phone].  Please show the CHEMO ALERT CARD at check-in to the Emergency Department and triage nurse.   

## 2015-06-04 ENCOUNTER — Telehealth: Payer: Self-pay | Admitting: Neurology

## 2015-06-04 NOTE — Telephone Encounter (Signed)
Patient's wife informed and appointment for the 26th of Oct has been cancelled.

## 2015-06-04 NOTE — Telephone Encounter (Signed)
Pt/ wife called to ask/ if he still needs to come for next appt?call back @ [phone]

## 2015-06-04 NOTE — Telephone Encounter (Signed)
OK to follow-up as needed, unless headaches are worsening.

## 2015-06-04 NOTE — Telephone Encounter (Signed)
Please advise 

## 2015-06-05 ENCOUNTER — Other Ambulatory Visit: Payer: Self-pay

## 2015-06-05 ENCOUNTER — Ambulatory Visit (INDEPENDENT_AMBULATORY_CARE_PROVIDER_SITE_OTHER): Payer: PPO | Admitting: Vascular Surgery

## 2015-06-05 ENCOUNTER — Encounter: Payer: Self-pay | Admitting: Vascular Surgery

## 2015-06-05 VITALS — BP 136/69 | HR 73 | Ht 69.0 in | Wt 187.0 lb

## 2015-06-05 DIAGNOSIS — N186 End stage renal disease: Secondary | ICD-10-CM | POA: Diagnosis not present

## 2015-06-05 NOTE — Progress Notes (Signed)
Age presents today for discussion of hemodialysis access. He underwent left radiocephalic AV fistula creation by myself on except 29 2016. He also had catheter placement that time and has had dialysis since then venous catheter. He has had several attempts for use of his AV fistula over the last several weeks. He has had the infiltration on most of these occasions with unsuccessful hemodialysis. He is seen today for discussion of further options.  Past Medical History  Diagnosis Date  . Hypertension   . COPD (chronic obstructive pulmonary disease) ([place])   . Asthma   . Chronic kidney disease   . History of kidney stones   . History of hiatal hernia   . Headache     HISTORY OF CLUSTER HEADACHES  . Arthritis   . Complication of anesthesia     TROUBLE WAKING UP  . H/O colonoscopy with polypectomy   . Allergic rhinitis   . DVT (deep venous thrombosis) ([facility])     RIGHT CALF AFTER A LITHOTRIPSY  . BPH (benign prostatic hypertrophy)   . Osteoarthritis   . Emphysema of lung [facility])     Social History  Substance Use Topics  . Smoking status: Former Smoker -- 2.00 packs/day    Types: Cigarettes  . Smokeless tobacco: Never Used  . Alcohol Use: No    Family History  Problem Relation Age of Onset  . Hypertension Mother   . Hypertension Father   . Cancer Brother   . Cancer Brother   . Healthy Son     No Known Allergies   Current outpatient prescriptions:  .  acyclovir (ZOVIRAX) 400 MG tablet, Take 1 tablet (400 mg total) by mouth 2 (two) times daily., Disp: 60 tablet, Rfl: 3 .  amLODipine (NORVASC) 5 MG tablet, Take 5 mg by mouth daily., Disp: , Rfl:  .  bisacodyl (DULCOLAX) 5 MG EC tablet, Take 1 tablet (5 mg total) by mouth daily as needed for moderate constipation., Disp: 30 tablet, Rfl: 0 .  Bortezomib (VELCADE IJ), Inject as directed., Disp: , Rfl:  .  budesonide-formoterol (SYMBICORT) 80-4.5 MCG/ACT inhaler, Inhale 2 puffs into the lungs 2 (two) times daily., Disp: , Rfl:  .   calcium carbonate (TUMS - DOSED IN MG ELEMENTAL CALCIUM) 500 MG chewable tablet, Chew 1 tablet by mouth 3 (three) times daily., Disp: , Rfl:  .  fluticasone (FLONASE) 50 MCG/ACT nasal spray, Place 1 spray into both nostrils daily., Disp: , Rfl:  .  metoprolol succinate (TOPROL-XL) 25 MG 24 hr tablet, Take 25 mg by mouth daily., Disp: , Rfl:  .  omeprazole (PRILOSEC) 40 MG capsule, Take 40 mg by mouth daily., Disp: , Rfl:  .  oxycodone (OXY-IR) 5 MG capsule, Take 5 mg by mouth every 4 (four) hours as needed., Disp: , Rfl:  .  dexamethasone (DECADRON) 4 MG tablet, Take 10 tablets (40 mg total) by mouth once a week. (Patient not taking: Reported on 05/31/2015), Disp: 40 tablet, Rfl: 4 .  gabapentin (NEURONTIN) 100 MG capsule, Take 1 capsule (100 mg total) by mouth at bedtime. (Patient not taking: Reported on 05/31/2015), Disp: 30 capsule, Rfl: 5 .  meclizine (ANTIVERT) 25 MG tablet, Take 1 tablet (25 mg total) by mouth 3 (three) times daily as needed for dizziness. (Patient not taking: Reported on 06/05/2015), Disp: 30 tablet, Rfl: 0 .  prochlorperazine (COMPAZINE) 10 MG tablet, Take 1 tablet (10 mg total) by mouth every 6 (six) hours as needed for nausea or vomiting. (Patient not taking:  Reported on 05/31/2015), Disp: 30 tablet, Rfl: 0  Filed Vitals:   06/05/15 0828  BP: 136/69  Pulse: 73  Height: 5\' 9"  (1.753 m)  Weight: 187 lb (84.823 kg)  SpO2: 97%    Body mass index is 27.6 kg/(m^2).       Physical exam is well-developed in no acute distress. Does have some difficulty standing and walking due to degenerative disc disease. His left forearm is noted for bruising in the radial and ulnar aspects. He does have an easily palpable radiocephalic forearm fistula. This is a good thrill throughout its course and does run relatively superficial.  I imaged his cephalic vein fistula with SonoSite ultrasound. This does show the vein to be of good caliber and of superficial location. There are  multiple side branches originating at the level above the arteriovenous anastomosis and the mid forearm.  Impression and plan: Multiple infiltrations of AV fistula. A long discussion with the patient and his wife present. A physical exam and by ultrasound does appear that this is worse after trying to salvage this fistula. He does have good size maturation and a superficial location of this fistula. We did discuss critical importance of having the most talented access staff using these fistulas. I would recommend ligation of these competing branches for improved flow through the fistula. Explained that this may or may not allow the fistula be ultimately use for hemodialysis. I would try this before abandoning this and trying for another access site. His upper arm cephalic vein is small and the majority of the outflow from the forearm fistula goes into the basilic system. He would be a candidate for basilic vein transposition fistula on the left if his cephalic vein fistula is not usable. He undergoes dialysis on Monday Wednesday and Friday. We'll either have ligation of competing branches on a Tuesday or Thursday or activity and potentially alter his dialysis days for me to be able to do his fistula ligation branches  not in the operating room on Tuesday and Thursday. They will determine when they wish to proceed with this procedure.

## 2015-06-06 ENCOUNTER — Ambulatory Visit: Payer: PPO | Admitting: Neurology

## 2015-06-07 ENCOUNTER — Other Ambulatory Visit: Payer: Self-pay | Admitting: Internal Medicine

## 2015-06-07 DIAGNOSIS — M545 Low back pain: Secondary | ICD-10-CM

## 2015-06-08 ENCOUNTER — Other Ambulatory Visit (HOSPITAL_BASED_OUTPATIENT_CLINIC_OR_DEPARTMENT_OTHER): Payer: PPO

## 2015-06-08 ENCOUNTER — Ambulatory Visit: Payer: PPO

## 2015-06-08 ENCOUNTER — Ambulatory Visit (HOSPITAL_BASED_OUTPATIENT_CLINIC_OR_DEPARTMENT_OTHER): Payer: PPO

## 2015-06-08 ENCOUNTER — Other Ambulatory Visit: Payer: PPO

## 2015-06-08 VITALS — BP 130/55 | HR 76 | Temp 98.1°F | Resp 18

## 2015-06-08 DIAGNOSIS — C9000 Multiple myeloma not having achieved remission: Secondary | ICD-10-CM

## 2015-06-08 DIAGNOSIS — Z5112 Encounter for antineoplastic immunotherapy: Secondary | ICD-10-CM | POA: Diagnosis not present

## 2015-06-08 LAB — CBC WITH DIFFERENTIAL/PLATELET
BASO%: 1.3 % (ref 0.0–2.0)
Basophils Absolute: 0.1 10*3/uL (ref 0.0–0.1)
EOS%: 3.1 % (ref 0.0–7.0)
Eosinophils Absolute: 0.1 10*3/uL (ref 0.0–0.5)
HEMATOCRIT: 27.9 % — AB (ref 38.4–49.9)
HEMOGLOBIN: 9.5 g/dL — AB (ref 13.0–17.1)
LYMPH#: 0.6 10*3/uL — AB (ref 0.9–3.3)
LYMPH%: 13.5 % — ABNORMAL LOW (ref 14.0–49.0)
MCH: 36 pg — ABNORMAL HIGH (ref 27.2–33.4)
MCHC: 34 g/dL (ref 32.0–36.0)
MCV: 105.7 fL — ABNORMAL HIGH (ref 79.3–98.0)
MONO#: 0.4 10*3/uL (ref 0.1–0.9)
MONO%: 8.1 % (ref 0.0–14.0)
NEUT%: 74 % (ref 39.0–75.0)
NEUTROS ABS: 3.3 10*3/uL (ref 1.5–6.5)
PLATELETS: 136 10*3/uL — AB (ref 140–400)
RBC: 2.64 10*6/uL — ABNORMAL LOW (ref 4.20–5.82)
RDW: 14.1 % (ref 11.0–14.6)
WBC: 4.5 10*3/uL (ref 4.0–10.3)

## 2015-06-08 LAB — COMPREHENSIVE METABOLIC PANEL (CC13)
ALT: 25 U/L (ref 0–55)
ANION GAP: 8 meq/L (ref 3–11)
AST: 29 U/L (ref 5–34)
Albumin: 3.3 g/dL — ABNORMAL LOW (ref 3.5–5.0)
Alkaline Phosphatase: 93 U/L (ref 40–150)
BILIRUBIN TOTAL: 0.65 mg/dL (ref 0.20–1.20)
BUN: 10 mg/dL (ref 7.0–26.0)
CALCIUM: 8.6 mg/dL (ref 8.4–10.4)
CHLORIDE: 103 meq/L (ref 98–109)
CO2: 32 mEq/L — ABNORMAL HIGH (ref 22–29)
CREATININE: 3.8 mg/dL — AB (ref 0.7–1.3)
EGFR: 14 mL/min/{1.73_m2} — ABNORMAL LOW (ref >90)
Glucose: 146 mg/dl — ABNORMAL HIGH (ref 70–140)
Potassium: 4.2 mEq/L (ref 3.5–5.1)
Sodium: 142 mEq/L (ref 136–145)
TOTAL PROTEIN: 6 g/dL — AB (ref 6.4–8.3)

## 2015-06-08 MED ORDER — BORTEZOMIB CHEMO SQ INJECTION 3.5 MG (2.5MG/ML)
1.3000 mg/m2 | Freq: Once | INTRAMUSCULAR | Status: AC
  Administered 2015-06-08: 2.5 mg via SUBCUTANEOUS
  Filled 2015-06-08: qty 2.5

## 2015-06-08 MED ORDER — ONDANSETRON HCL 8 MG PO TABS
ORAL_TABLET | ORAL | Status: AC
  Filled 2015-06-08: qty 1

## 2015-06-08 MED ORDER — ONDANSETRON HCL 8 MG PO TABS
8.0000 mg | ORAL_TABLET | Freq: Once | ORAL | Status: AC
  Administered 2015-06-08: 8 mg via ORAL

## 2015-06-08 NOTE — Progress Notes (Signed)
Per pharmacy, Justin Woods says okay to treat with today's creatinine of 3.8.

## 2015-06-08 NOTE — Patient Instructions (Signed)
[facility] Discharge Instructions for Patients Receiving Chemotherapy  Today you received the following chemotherapy agents Velcade. To help prevent nausea and vomiting after your treatment, we encourage you to take your nausea medication as directed.  If you develop nausea and vomiting that is not controlled by your nausea medication, call the clinic.   BELOW ARE SYMPTOMS THAT SHOULD BE REPORTED IMMEDIATELY:  *FEVER GREATER THAN 100.5 F  *CHILLS WITH OR WITHOUT FEVER  NAUSEA AND VOMITING THAT IS NOT CONTROLLED WITH YOUR NAUSEA MEDICATION  *UNUSUAL SHORTNESS OF BREATH  *UNUSUAL BRUISING OR BLEEDING  TENDERNESS IN MOUTH AND THROAT WITH OR WITHOUT PRESENCE OF ULCERS  *URINARY PROBLEMS  *BOWEL PROBLEMS  UNUSUAL RASH Items with * indicate a potential emergency and should be followed up as soon as possible.  Feel free to call the clinic you have any questions or concerns. The clinic phone number is [phone].  Please show the CHEMO ALERT CARD at check-in to the Emergency Department and triage nurse.    

## 2015-06-09 ENCOUNTER — Ambulatory Visit
Admission: RE | Admit: 2015-06-09 | Discharge: 2015-06-09 | Disposition: A | Discharge status: Home / Self Care | Payer: PPO | Source: Ambulatory Visit | Attending: Internal Medicine | Admitting: Internal Medicine

## 2015-06-09 DIAGNOSIS — M545 Low back pain: Secondary | ICD-10-CM

## 2015-06-11 ENCOUNTER — Encounter (HOSPITAL_COMMUNITY): Payer: Self-pay | Admitting: *Deleted

## 2015-06-11 MED ORDER — DEXTROSE 5 % IV SOLN
1.5000 g | INTRAVENOUS | Status: AC
  Administered 2015-06-12: 1.5 g via INTRAVENOUS
  Filled 2015-06-11: qty 1.5

## 2015-06-11 MED ORDER — CHLORHEXIDINE GLUCONATE CLOTH 2 % EX PADS: 6.0000 | MEDICATED_PAD | Freq: Once | CUTANEOUS | Status: DC

## 2015-06-11 MED ORDER — SODIUM CHLORIDE 0.9 % IV SOLN
INTRAVENOUS | Status: DC
  Administered 2015-06-12: 07:00:00 via INTRAVENOUS

## 2015-06-12 ENCOUNTER — Encounter (HOSPITAL_COMMUNITY)
Admission: RE | Disposition: A | Discharge status: Home / Self Care | Payer: Self-pay | Source: Ambulatory Visit | Attending: Vascular Surgery

## 2015-06-12 ENCOUNTER — Ambulatory Visit (HOSPITAL_COMMUNITY): Payer: PPO | Admitting: Anesthesiology

## 2015-06-12 ENCOUNTER — Other Ambulatory Visit (HOSPITAL_COMMUNITY): Payer: Self-pay | Admitting: Interventional Radiology

## 2015-06-12 ENCOUNTER — Encounter (HOSPITAL_COMMUNITY): Payer: Self-pay | Admitting: *Deleted

## 2015-06-12 ENCOUNTER — Ambulatory Visit (HOSPITAL_COMMUNITY)
Admission: RE | Admit: 2015-06-12 | Discharge: 2015-06-12 | Disposition: A | Discharge status: Home / Self Care | Payer: PPO | Source: Ambulatory Visit | Attending: Vascular Surgery | Admitting: Vascular Surgery

## 2015-06-12 ENCOUNTER — Telehealth: Payer: Self-pay | Admitting: Vascular Surgery

## 2015-06-12 ENCOUNTER — Ambulatory Visit: Payer: PPO | Admitting: Vascular Surgery

## 2015-06-12 DIAGNOSIS — N400 Enlarged prostate without lower urinary tract symptoms: Secondary | ICD-10-CM | POA: Insufficient documentation

## 2015-06-12 DIAGNOSIS — N186 End stage renal disease: Secondary | ICD-10-CM | POA: Insufficient documentation

## 2015-06-12 DIAGNOSIS — Z992 Dependence on renal dialysis: Secondary | ICD-10-CM | POA: Diagnosis not present

## 2015-06-12 DIAGNOSIS — Z7951 Long term (current) use of inhaled steroids: Secondary | ICD-10-CM | POA: Insufficient documentation

## 2015-06-12 DIAGNOSIS — J45909 Unspecified asthma, uncomplicated: Secondary | ICD-10-CM | POA: Diagnosis not present

## 2015-06-12 DIAGNOSIS — T82898A Other specified complication of vascular prosthetic devices, implants and grafts, initial encounter: Secondary | ICD-10-CM | POA: Insufficient documentation

## 2015-06-12 DIAGNOSIS — Z79891 Long term (current) use of opiate analgesic: Secondary | ICD-10-CM | POA: Insufficient documentation

## 2015-06-12 DIAGNOSIS — J439 Emphysema, unspecified: Secondary | ICD-10-CM | POA: Diagnosis not present

## 2015-06-12 DIAGNOSIS — IMO0002 Reserved for concepts with insufficient information to code with codable children: Secondary | ICD-10-CM

## 2015-06-12 DIAGNOSIS — Z87891 Personal history of nicotine dependence: Secondary | ICD-10-CM | POA: Insufficient documentation

## 2015-06-12 DIAGNOSIS — D649 Anemia, unspecified: Secondary | ICD-10-CM | POA: Insufficient documentation

## 2015-06-12 DIAGNOSIS — Z86718 Personal history of other venous thrombosis and embolism: Secondary | ICD-10-CM | POA: Insufficient documentation

## 2015-06-12 DIAGNOSIS — I120 Hypertensive chronic kidney disease with stage 5 chronic kidney disease or end stage renal disease: Secondary | ICD-10-CM | POA: Insufficient documentation

## 2015-06-12 DIAGNOSIS — M1990 Unspecified osteoarthritis, unspecified site: Secondary | ICD-10-CM | POA: Insufficient documentation

## 2015-06-12 DIAGNOSIS — Z79899 Other long term (current) drug therapy: Secondary | ICD-10-CM | POA: Diagnosis not present

## 2015-06-12 HISTORY — DX: Constipation, unspecified: K59.00

## 2015-06-12 HISTORY — PX: LIGATION OF COMPETING BRANCHES OF ARTERIOVENOUS FISTULA: SHX5949

## 2015-06-12 HISTORY — DX: Anemia, unspecified: D64.9

## 2015-06-12 HISTORY — DX: Dorsalgia, unspecified: M54.9

## 2015-06-12 HISTORY — DX: Malignant (primary) neoplasm, unspecified: C80.1

## 2015-06-12 LAB — POCT I-STAT 4, (NA,K, GLUC, HGB,HCT)
GLUCOSE: 90 mg/dL (ref 65–99)
HEMATOCRIT: 27 % — AB (ref 39.0–52.0)
HEMOGLOBIN: 9.2 g/dL — AB (ref 13.0–17.0)
POTASSIUM: 4.8 mmol/L (ref 3.5–5.1)
Sodium: 136 mmol/L (ref 135–145)

## 2015-06-12 SURGERY — LIGATION OF COMPETING BRANCHES OF ARTERIOVENOUS FISTULA
Anesthesia: Monitor Anesthesia Care | Site: Arm Lower | Laterality: Left

## 2015-06-12 MED ORDER — MIDAZOLAM HCL 2 MG/2ML IJ SOLN
INTRAMUSCULAR | Status: AC
  Filled 2015-06-12: qty 4

## 2015-06-12 MED ORDER — OXYCODONE HCL 5 MG PO TABS
5.0000 mg | ORAL_TABLET | Freq: Once | ORAL | Status: AC | PRN
  Administered 2015-06-12: 5 mg via ORAL

## 2015-06-12 MED ORDER — HEPARIN SODIUM (PORCINE) 1000 UNIT/ML IJ SOLN
INTRAMUSCULAR | Status: AC
  Filled 2015-06-12: qty 1

## 2015-06-12 MED ORDER — SUCCINYLCHOLINE CHLORIDE 20 MG/ML IJ SOLN
INTRAMUSCULAR | Status: AC
  Filled 2015-06-12: qty 1

## 2015-06-12 MED ORDER — ROCURONIUM BROMIDE 50 MG/5ML IV SOLN
INTRAVENOUS | Status: AC
  Filled 2015-06-12: qty 1

## 2015-06-12 MED ORDER — LIDOCAINE HCL (PF) 1 % IJ SOLN
INTRAMUSCULAR | Status: AC
  Filled 2015-06-12: qty 30

## 2015-06-12 MED ORDER — LIDOCAINE HCL (CARDIAC) 20 MG/ML IV SOLN
INTRAVENOUS | Status: AC
  Filled 2015-06-12: qty 5

## 2015-06-12 MED ORDER — LIDOCAINE HCL (PF) 1 % IJ SOLN
INTRAMUSCULAR | Status: DC | PRN
  Administered 2015-06-12: 30 mL

## 2015-06-12 MED ORDER — EPHEDRINE SULFATE 50 MG/ML IJ SOLN
INTRAMUSCULAR | Status: AC
  Filled 2015-06-12: qty 1

## 2015-06-12 MED ORDER — FENTANYL CITRATE (PF) 250 MCG/5ML IJ SOLN
INTRAMUSCULAR | Status: AC
  Filled 2015-06-12: qty 5

## 2015-06-12 MED ORDER — DEXMEDETOMIDINE HCL IN NACL 400 MCG/100ML IV SOLN
INTRAVENOUS | Status: DC | PRN
  Administered 2015-06-12 (×6): 8 ug via INTRAVENOUS

## 2015-06-12 MED ORDER — PROPOFOL 10 MG/ML IV BOLUS
INTRAVENOUS | Status: AC
  Filled 2015-06-12: qty 20

## 2015-06-12 MED ORDER — 0.9 % SODIUM CHLORIDE (POUR BTL) OPTIME
TOPICAL | Status: DC | PRN
  Administered 2015-06-12: 1000 mL

## 2015-06-12 MED ORDER — OXYCODONE HCL 5 MG PO TABS: 5.0000 mg | ORAL_TABLET | ORAL | Status: DC | PRN

## 2015-06-12 MED ORDER — PHENYLEPHRINE 40 MCG/ML (10ML) SYRINGE FOR IV PUSH (FOR BLOOD PRESSURE SUPPORT)
PREFILLED_SYRINGE | INTRAVENOUS | Status: AC
  Filled 2015-06-12: qty 10

## 2015-06-12 MED ORDER — SODIUM CHLORIDE 0.9 % IJ SOLN
INTRAMUSCULAR | Status: AC
  Filled 2015-06-12: qty 10

## 2015-06-12 MED ORDER — ONDANSETRON HCL 4 MG/2ML IJ SOLN
INTRAMUSCULAR | Status: AC
  Filled 2015-06-12: qty 2

## 2015-06-12 MED ORDER — OXYCODONE HCL 5 MG PO TABS: 2.5000 mg | ORAL_TABLET | Freq: Four times a day (QID) | ORAL | Status: AC | PRN

## 2015-06-12 MED ORDER — OXYCODONE HCL 5 MG/5ML PO SOLN: 5.0000 mg | Freq: Once | ORAL | Status: AC | PRN

## 2015-06-12 MED ORDER — FENTANYL CITRATE (PF) 100 MCG/2ML IJ SOLN
INTRAMUSCULAR | Status: DC | PRN
  Administered 2015-06-12: 50 ug via INTRAVENOUS

## 2015-06-12 MED ORDER — DEXMEDETOMIDINE HCL IN NACL 200 MCG/50ML IV SOLN
INTRAVENOUS | Status: AC
  Filled 2015-06-12: qty 50

## 2015-06-12 MED ORDER — OXYCODONE HCL 5 MG PO TABS
ORAL_TABLET | ORAL | Status: AC
  Filled 2015-06-12: qty 1

## 2015-06-12 MED ORDER — THROMBIN 20000 UNITS EX SOLR
CUTANEOUS | Status: AC
  Filled 2015-06-12: qty 20000

## 2015-06-12 SURGICAL SUPPLY — 34 items
CANISTER SUCTION 2500CC (MISCELLANEOUS) ×3 IMPLANT
CLIP TI MEDIUM 24 (CLIP) ×3 IMPLANT
CLIP TI WIDE RED SMALL 24 (CLIP) ×3 IMPLANT
COVER PROBE W GEL 5X96 (DRAPES) ×3 IMPLANT
ELECT REM PT RETURN 9FT ADLT (ELECTROSURGICAL) ×3
ELECTRODE REM PT RTRN 9FT ADLT (ELECTROSURGICAL) ×1 IMPLANT
GAUZE SPONGE 4X4 12PLY STRL (GAUZE/BANDAGES/DRESSINGS) ×3 IMPLANT
GEL ULTRASOUND 20GR AQUASONIC (MISCELLANEOUS) IMPLANT
GLOVE BIO SURGEON STRL SZ7 (GLOVE) ×6 IMPLANT
GLOVE BIOGEL PI IND STRL 6.5 (GLOVE) ×1 IMPLANT
GLOVE BIOGEL PI IND STRL 7.0 (GLOVE) ×1 IMPLANT
GLOVE BIOGEL PI IND STRL 7.5 (GLOVE) ×1 IMPLANT
GLOVE BIOGEL PI INDICATOR 6.5 (GLOVE) ×2
GLOVE BIOGEL PI INDICATOR 7.0 (GLOVE) ×2
GLOVE BIOGEL PI INDICATOR 7.5 (GLOVE) ×2
GLOVE SKINSENSE NS SZ7.0 (GLOVE) ×2
GLOVE SKINSENSE STRL SZ7.0 (GLOVE) ×1 IMPLANT
GOWN STRL REUS W/ TWL LRG LVL3 (GOWN DISPOSABLE) ×3 IMPLANT
GOWN STRL REUS W/TWL LRG LVL3 (GOWN DISPOSABLE) ×9
KIT BASIN OR (CUSTOM PROCEDURE TRAY) ×3 IMPLANT
KIT ROOM TURNOVER OR (KITS) ×3 IMPLANT
LIQUID BAND (GAUZE/BANDAGES/DRESSINGS) ×3 IMPLANT
NS IRRIG 1000ML POUR BTL (IV SOLUTION) ×3 IMPLANT
PACK CV ACCESS (CUSTOM PROCEDURE TRAY) ×3 IMPLANT
PAD ARMBOARD 7.5X6 YLW CONV (MISCELLANEOUS) ×6 IMPLANT
SPONGE SURGIFOAM ABS GEL 100 (HEMOSTASIS) IMPLANT
SUT ETHILON 3 0 PS 1 (SUTURE) IMPLANT
SUT MNCRL AB 4-0 PS2 18 (SUTURE) ×6 IMPLANT
SUT PROLENE 6 0 BV (SUTURE) IMPLANT
SUT SILK 0 TIES 10X30 (SUTURE) ×3 IMPLANT
SUT VIC AB 3-0 SH 27 (SUTURE) ×3
SUT VIC AB 3-0 SH 27X BRD (SUTURE) ×1 IMPLANT
UNDERPAD 30X30 INCONTINENT (UNDERPADS AND DIAPERS) ×3 IMPLANT
WATER STERILE IRR 1000ML POUR (IV SOLUTION) ×3 IMPLANT

## 2015-06-12 NOTE — Telephone Encounter (Signed)
-----   Message from Mena Goes, RN sent at 06/12/2015 10:20 AM EDT ----- Regarding: Schedule   ----- Message -----    From: Alvia Grove, PA-C    Sent: 06/12/2015   9:06 AM      To: [facility] Charge Pool  S/p ligation of competing branches left arm AVF 06/12/15  F/u in 2 weeks with Dr. Bridgett Larsson. No duplex.   Thanks Maudie Mercury

## 2015-06-12 NOTE — Anesthesia Procedure Notes (Signed)
Date/Time: 06/12/2015 8:40 AM Performed by: Carney Living Pre-anesthesia Checklist: Patient identified, Emergency Drugs available, Suction available, Patient being monitored and Timeout performed Patient Re-evaluated:Patient Re-evaluated prior to inductionOxygen Delivery Method: Simple face mask

## 2015-06-12 NOTE — H&P (View-Only) (Signed)
Age presents today for discussion of hemodialysis access. He underwent left radiocephalic AV fistula creation by myself on except 29 2016. He also had catheter placement that time and has had dialysis since then venous catheter. He has had several attempts for use of his AV fistula over the last several weeks. He has had the infiltration on most of these occasions with unsuccessful hemodialysis. He is seen today for discussion of further options.  Past Medical History  Diagnosis Date  . Hypertension   . COPD (chronic obstructive pulmonary disease) ([place])   . Asthma   . Chronic kidney disease   . History of kidney stones   . History of hiatal hernia   . Headache     HISTORY OF CLUSTER HEADACHES  . Arthritis   . Complication of anesthesia     TROUBLE WAKING UP  . H/O colonoscopy with polypectomy   . Allergic rhinitis   . DVT (deep venous thrombosis) ([facility])     RIGHT CALF AFTER A LITHOTRIPSY  . BPH (benign prostatic hypertrophy)   . Osteoarthritis   . Emphysema of lung [facility])     Social History  Substance Use Topics  . Smoking status: Former Smoker -- 2.00 packs/day    Types: Cigarettes  . Smokeless tobacco: Never Used  . Alcohol Use: No    Family History  Problem Relation Age of Onset  . Hypertension Mother   . Hypertension Father   . Cancer Brother   . Cancer Brother   . Healthy Son     No Known Allergies   Current outpatient prescriptions:  .  acyclovir (ZOVIRAX) 400 MG tablet, Take 1 tablet (400 mg total) by mouth 2 (two) times daily., Disp: 60 tablet, Rfl: 3 .  amLODipine (NORVASC) 5 MG tablet, Take 5 mg by mouth daily., Disp: , Rfl:  .  bisacodyl (DULCOLAX) 5 MG EC tablet, Take 1 tablet (5 mg total) by mouth daily as needed for moderate constipation., Disp: 30 tablet, Rfl: 0 .  Bortezomib (VELCADE IJ), Inject as directed., Disp: , Rfl:  .  budesonide-formoterol (SYMBICORT) 80-4.5 MCG/ACT inhaler, Inhale 2 puffs into the lungs 2 (two) times daily., Disp: , Rfl:  .   calcium carbonate (TUMS - DOSED IN MG ELEMENTAL CALCIUM) 500 MG chewable tablet, Chew 1 tablet by mouth 3 (three) times daily., Disp: , Rfl:  .  fluticasone (FLONASE) 50 MCG/ACT nasal spray, Place 1 spray into both nostrils daily., Disp: , Rfl:  .  metoprolol succinate (TOPROL-XL) 25 MG 24 hr tablet, Take 25 mg by mouth daily., Disp: , Rfl:  .  omeprazole (PRILOSEC) 40 MG capsule, Take 40 mg by mouth daily., Disp: , Rfl:  .  oxycodone (OXY-IR) 5 MG capsule, Take 5 mg by mouth every 4 (four) hours as needed., Disp: , Rfl:  .  dexamethasone (DECADRON) 4 MG tablet, Take 10 tablets (40 mg total) by mouth once a week. (Patient not taking: Reported on 05/31/2015), Disp: 40 tablet, Rfl: 4 .  gabapentin (NEURONTIN) 100 MG capsule, Take 1 capsule (100 mg total) by mouth at bedtime. (Patient not taking: Reported on 05/31/2015), Disp: 30 capsule, Rfl: 5 .  meclizine (ANTIVERT) 25 MG tablet, Take 1 tablet (25 mg total) by mouth 3 (three) times daily as needed for dizziness. (Patient not taking: Reported on 06/05/2015), Disp: 30 tablet, Rfl: 0 .  prochlorperazine (COMPAZINE) 10 MG tablet, Take 1 tablet (10 mg total) by mouth every 6 (six) hours as needed for nausea or vomiting. (Patient not taking:  Reported on 05/31/2015), Disp: 30 tablet, Rfl: 0  Filed Vitals:   06/05/15 0828  BP: 136/69  Pulse: 73  Height: 5\' 9"  (1.753 m)  Weight: 187 lb (84.823 kg)  SpO2: 97%    Body mass index is 27.6 kg/(m^2).       Physical exam is well-developed in no acute distress. Does have some difficulty standing and walking due to degenerative disc disease. His left forearm is noted for bruising in the radial and ulnar aspects. He does have an easily palpable radiocephalic forearm fistula. This is a good thrill throughout its course and does run relatively superficial.  I imaged his cephalic vein fistula with SonoSite ultrasound. This does show the vein to be of good caliber and of superficial location. There are  multiple side branches originating at the level above the arteriovenous anastomosis and the mid forearm.  Impression and plan: Multiple infiltrations of AV fistula. A long discussion with the patient and his wife present. A physical exam and by ultrasound does appear that this is worse after trying to salvage this fistula. He does have good size maturation and a superficial location of this fistula. We did discuss critical importance of having the most talented access staff using these fistulas. I would recommend ligation of these competing branches for improved flow through the fistula. Explained that this may or may not allow the fistula be ultimately use for hemodialysis. I would try this before abandoning this and trying for another access site. His upper arm cephalic vein is small and the majority of the outflow from the forearm fistula goes into the basilic system. He would be a candidate for basilic vein transposition fistula on the left if his cephalic vein fistula is not usable. He undergoes dialysis on Monday Wednesday and Friday. We'll either have ligation of competing branches on a Tuesday or Thursday or activity and potentially alter his dialysis days for me to be able to do his fistula ligation branches  not in the operating room on Tuesday and Thursday. They will determine when they wish to proceed with this procedure.

## 2015-06-12 NOTE — Interval H&P Note (Signed)
History and Physical Interval Note:  06/12/2015 8:29 AM  Justin Woods  has presented today for surgery, with the diagnosis of End Stage Renal Disease N18.6; Infiltrations of left forearm arteriovenous fistula T82.898A  The various methods of treatment have been discussed with the patient and family. After consideration of risks, benefits and other options for treatment, the patient has consented to  Procedure(s): LIGATION OF COMPETING BRANCHES OF ARTERIOVENOUS FISTULA (Left) as a surgical intervention .  The patient's history has been reviewed, patient examined, no change in status, stable for surgery.  I have reviewed the patient's chart and labs.  Questions were answered to the patient's satisfaction.     Adele Barthel

## 2015-06-12 NOTE — Transfer of Care (Signed)
Immediate Anesthesia Transfer of Care Note  Patient: Justin Woods  Procedure(s) Performed: Procedure(s): LIGATION OF COMPETING BRANCHES OF Left Arm ARTERIOVENOUS FISTULA (Left)  Patient Location: PACU  Anesthesia Type:MAC  Level of Consciousness: awake, alert , oriented and patient cooperative  Airway & Oxygen Therapy: Patient Spontanous Breathing and Patient connected to nasal cannula oxygen  Post-op Assessment: Report given to RN, Post -op Vital signs reviewed and stable and Patient moving all extremities X 4  Post vital signs: Reviewed and stable  Last Vitals:  Filed Vitals:   06/12/15 0649  BP: 159/66  Pulse: 86  Temp: 37.0 C    Complications: No apparent anesthesia complications

## 2015-06-12 NOTE — Op Note (Signed)
    OPERATIVE NOTE   PROCEDURE: Ligation of side branch x 2 left radiocephalic arteriovenous fistula   PRE-OPERATIVE DIAGNOSIS: difficulty cannulating left radiocephalic arteriovenous fistula    POST-OPERATIVE DIAGNOSIS: same as above   SURGEON: Adele Barthel, MD  ANESTHESIA: local and MAC  ESTIMATED BLOOD LOSS: 50 cc  FINDING(S): 1.  Proximal fistula > 6 mm after ligation of side branch 2.  Distal fistula 3-4 mm in diameter 3.  Improved thrill at end of case  SPECIMEN(S):  none  INDICATIONS:   Justin Woods is a 81 y.o. male who presents with a left radiocephalic arteriovenous fistula that continues to be difficult to cannulate.  Dr. Donnetta Hutching recommended ligation of side-branches as the last maneuver before going into the upper arm for an access.  The patient is aware the risk include but are not limited to: bleeding, infection, and thrombosis of the access.  DESCRIPTION: After obtaining full informed written consent, the patient was brought back to the operating room and placed supine upon the operating table.  The patient received IV antibiotics prior to induction.  After obtaining adequate anesthesia, the patient was prepped and draped in the standard fashion for: left arm access procedure.  I interrogated the left radiocephalic arteriovenous fistula with Sonosite, identifying two large side branches: one proximal and one distal.  I injected a total 10 cc of 1% lidocaine without epinephrine at both sites to obtain local anesthesia.  I made incisions over both sites and dissected out the fistula.  Proximally, the branch appeared to dive deep.  I ligated this branch with two 2-0 silks and then transected the branch.  I then turned my attention to the distal fistula.  I dissected out the fistula here and the branch appeared to be lateral to the main channel.  I serially clamped the two branches to identify the main branch.  I tied off the side branch with 2 2-0 silk ties and then  transected the branch.  Both incisions were washed out.  I reapproximated the subcutaneous tissue with running stitches of 3-0 Vicryl in both incisions.  The skin in both incisions was reapproximated with a running subcuticular stitch 4-0 Monocryl.  The skin was cleaned, dried, and the skin reinforced with Dermabond.  Throughout this fistula the thrill was improved at the end of the case.   COMPLICATIONS: none  CONDITION: stable   Adele Barthel, MD [facility] Office: [phone] Pager: [phone]  06/12/2015, 9:48 AM

## 2015-06-12 NOTE — Anesthesia Preprocedure Evaluation (Addendum)
Anesthesia Evaluation  Patient identified by MRN, date of birth, ID band Patient awake    Reviewed: Allergy & Precautions, NPO status , Patient's Chart, lab work & pertinent test results  Airway Mallampati: II   Neck ROM: Full    Dental  (+) Edentulous Upper, Edentulous Lower   Pulmonary COPD,  COPD inhaler, former smoker,    breath sounds clear to auscultation       Cardiovascular hypertension, Pt. on medications and Pt. on home beta blockers  Rhythm:Regular Rate:Normal     Neuro/Psych  Headaches,    GI/Hepatic hiatal hernia, GERD  Medicated and Controlled,  Endo/Other    Renal/GU ESRF and DialysisRenal disease     Musculoskeletal  (+) Arthritis , Osteoarthritis,    Abdominal   Peds  Hematology  (+) anemia ,   Anesthesia Other Findings   Reproductive/Obstetrics                            Anesthesia Physical Anesthesia Plan  ASA: III  Anesthesia Plan: MAC   Post-op Pain Management:    Induction: Intravenous  Airway Management Planned: Natural Airway and Simple Face Mask  Additional Equipment:   Intra-op Plan:   Post-operative Plan:   Informed Consent: I have reviewed the patients History and Physical, chart, labs and discussed the procedure including the risks, benefits and alternatives for the proposed anesthesia with the patient or authorized representative who has indicated his/her understanding and acceptance.     Plan Discussed with: CRNA, Anesthesiologist and Surgeon  Anesthesia Plan Comments:        Anesthesia Quick Evaluation

## 2015-06-12 NOTE — Telephone Encounter (Signed)
LM for pt re appt, dpm °

## 2015-06-12 NOTE — Anesthesia Postprocedure Evaluation (Signed)
  Anesthesia Post-op Note  Patient: Justin Woods  Procedure(s) Performed: Procedure(s): LIGATION OF COMPETING BRANCHES OF Left Arm ARTERIOVENOUS FISTULA (Left)  Patient Location: PACU  Anesthesia Type:MAC  Level of Consciousness: awake, alert  and oriented  Airway and Oxygen Therapy: Patient Spontanous Breathing  Post-op Pain: none  Post-op Assessment: Post-op Vital signs reviewed, Patient's Cardiovascular Status Stable, Respiratory Function Stable, Patent Airway and Pain level controlled              Post-op Vital Signs: stable  Last Vitals:  Filed Vitals:   06/12/15 1045  BP: 133/67  Pulse: 74  Temp:   Resp: 16    Complications: No apparent anesthesia complications

## 2015-06-13 ENCOUNTER — Encounter (HOSPITAL_COMMUNITY): Payer: Self-pay | Admitting: Vascular Surgery

## 2015-06-13 ENCOUNTER — Telehealth: Payer: Self-pay | Admitting: Internal Medicine

## 2015-06-13 ENCOUNTER — Other Ambulatory Visit (HOSPITAL_COMMUNITY): Payer: Self-pay | Admitting: Interventional Radiology

## 2015-06-13 NOTE — Telephone Encounter (Signed)
pt wife call and needed to r/s 11.10 appt due to pt having a surgery done...moved appts to 11.11.Marland KitchenMarland KitchenMarland KitchenMarland Kitchenper pt wife ok and aware

## 2015-06-14 ENCOUNTER — Other Ambulatory Visit (HOSPITAL_COMMUNITY): Payer: Self-pay | Admitting: Interventional Radiology

## 2015-06-14 ENCOUNTER — Ambulatory Visit (HOSPITAL_COMMUNITY)
Admission: RE | Admit: 2015-06-14 | Discharge: 2015-06-14 | Disposition: A | Discharge status: Home / Self Care | Payer: PPO | Source: Ambulatory Visit | Attending: Interventional Radiology | Admitting: Interventional Radiology

## 2015-06-14 DIAGNOSIS — C9000 Multiple myeloma not having achieved remission: Secondary | ICD-10-CM

## 2015-06-14 DIAGNOSIS — IMO0002 Reserved for concepts with insufficient information to code with codable children: Secondary | ICD-10-CM

## 2015-06-14 DIAGNOSIS — M549 Dorsalgia, unspecified: Secondary | ICD-10-CM

## 2015-06-15 ENCOUNTER — Other Ambulatory Visit (HOSPITAL_BASED_OUTPATIENT_CLINIC_OR_DEPARTMENT_OTHER): Payer: PPO

## 2015-06-15 ENCOUNTER — Ambulatory Visit: Payer: PPO

## 2015-06-15 ENCOUNTER — Ambulatory Visit (HOSPITAL_BASED_OUTPATIENT_CLINIC_OR_DEPARTMENT_OTHER): Payer: PPO

## 2015-06-15 ENCOUNTER — Other Ambulatory Visit: Payer: PPO

## 2015-06-15 VITALS — BP 158/71 | HR 83 | Temp 98.0°F | Resp 18

## 2015-06-15 DIAGNOSIS — Z5112 Encounter for antineoplastic immunotherapy: Secondary | ICD-10-CM | POA: Diagnosis not present

## 2015-06-15 DIAGNOSIS — C9000 Multiple myeloma not having achieved remission: Secondary | ICD-10-CM | POA: Diagnosis not present

## 2015-06-15 LAB — CBC WITH DIFFERENTIAL/PLATELET
BASO%: 0.9 % (ref 0.0–2.0)
BASOS ABS: 0.1 10*3/uL (ref 0.0–0.1)
EOS ABS: 0.2 10*3/uL (ref 0.0–0.5)
EOS%: 2.2 % (ref 0.0–7.0)
HEMATOCRIT: 27.6 % — AB (ref 38.4–49.9)
HEMOGLOBIN: 9.3 g/dL — AB (ref 13.0–17.1)
LYMPH#: 0.6 10*3/uL — AB (ref 0.9–3.3)
LYMPH%: 7.1 % — ABNORMAL LOW (ref 14.0–49.0)
MCH: 35.6 pg — ABNORMAL HIGH (ref 27.2–33.4)
MCHC: 33.7 g/dL (ref 32.0–36.0)
MCV: 105.6 fL — AB (ref 79.3–98.0)
MONO#: 0.5 10*3/uL (ref 0.1–0.9)
MONO%: 5.6 % (ref 0.0–14.0)
NEUT#: 7.4 10*3/uL — ABNORMAL HIGH (ref 1.5–6.5)
NEUT%: 84.2 % — AB (ref 39.0–75.0)
PLATELETS: 149 10*3/uL (ref 140–400)
RBC: 2.61 10*6/uL — ABNORMAL LOW (ref 4.20–5.82)
RDW: 14.2 % (ref 11.0–14.6)
WBC: 8.7 10*3/uL (ref 4.0–10.3)

## 2015-06-15 LAB — COMPREHENSIVE METABOLIC PANEL (CC13)
ALBUMIN: 3 g/dL — AB (ref 3.5–5.0)
ALK PHOS: 111 U/L (ref 40–150)
ALT: 26 U/L (ref 0–55)
AST: 32 U/L (ref 5–34)
Anion Gap: 8 mEq/L (ref 3–11)
BILIRUBIN TOTAL: 0.75 mg/dL (ref 0.20–1.20)
BUN: 12.7 mg/dL (ref 7.0–26.0)
CO2: 33 mEq/L — ABNORMAL HIGH (ref 22–29)
Calcium: 8.8 mg/dL (ref 8.4–10.4)
Chloride: 97 mEq/L — ABNORMAL LOW (ref 98–109)
Creatinine: 4.3 mg/dL (ref 0.7–1.3)
EGFR: 12 mL/min/{1.73_m2} — ABNORMAL LOW (ref >90)
GLUCOSE: 156 mg/dL — AB (ref 70–140)
POTASSIUM: 3.9 meq/L (ref 3.5–5.1)
SODIUM: 138 meq/L (ref 136–145)
TOTAL PROTEIN: 6 g/dL — AB (ref 6.4–8.3)

## 2015-06-15 MED ORDER — BORTEZOMIB CHEMO SQ INJECTION 3.5 MG (2.5MG/ML)
1.3000 mg/m2 | Freq: Once | INTRAMUSCULAR | Status: AC
Start: 2015-06-15 — End: 2015-06-15
  Administered 2015-06-15: 2.5 mg via SUBCUTANEOUS
  Filled 2015-06-15: qty 2.5

## 2015-06-15 MED ORDER — ONDANSETRON HCL 8 MG PO TABS
8.0000 mg | ORAL_TABLET | Freq: Once | ORAL | Status: AC
  Administered 2015-06-15: 8 mg via ORAL

## 2015-06-15 NOTE — Patient Instructions (Signed)
[facility] Discharge Instructions for Patients Receiving Chemotherapy  Today you received the following chemotherapy agents Velcade. To help prevent nausea and vomiting after your treatment, we encourage you to take your nausea medication as directed.  If you develop nausea and vomiting that is not controlled by your nausea medication, call the clinic.   BELOW ARE SYMPTOMS THAT SHOULD BE REPORTED IMMEDIATELY:  *FEVER GREATER THAN 100.5 F  *CHILLS WITH OR WITHOUT FEVER  NAUSEA AND VOMITING THAT IS NOT CONTROLLED WITH YOUR NAUSEA MEDICATION  *UNUSUAL SHORTNESS OF BREATH  *UNUSUAL BRUISING OR BLEEDING  TENDERNESS IN MOUTH AND THROAT WITH OR WITHOUT PRESENCE OF ULCERS  *URINARY PROBLEMS  *BOWEL PROBLEMS  UNUSUAL RASH Items with * indicate a potential emergency and should be followed up as soon as possible.  Feel free to call the clinic you have any questions or concerns. The clinic phone number is [phone].  Please show the CHEMO ALERT CARD at check-in to the Emergency Department and triage nurse.    

## 2015-06-20 ENCOUNTER — Other Ambulatory Visit: Payer: Self-pay | Admitting: Radiology

## 2015-06-21 ENCOUNTER — Other Ambulatory Visit: Payer: PPO

## 2015-06-21 ENCOUNTER — Ambulatory Visit (HOSPITAL_COMMUNITY)
Admission: RE | Admit: 2015-06-21 | Discharge: 2015-06-21 | Disposition: A | Discharge status: Home / Self Care | Payer: PPO | Source: Ambulatory Visit | Attending: Interventional Radiology | Admitting: Interventional Radiology

## 2015-06-21 ENCOUNTER — Other Ambulatory Visit (HOSPITAL_COMMUNITY): Payer: Self-pay | Admitting: Interventional Radiology

## 2015-06-21 ENCOUNTER — Ambulatory Visit: Payer: PPO | Admitting: Internal Medicine

## 2015-06-21 DIAGNOSIS — M549 Dorsalgia, unspecified: Secondary | ICD-10-CM | POA: Insufficient documentation

## 2015-06-21 DIAGNOSIS — M4856XA Collapsed vertebra, not elsewhere classified, lumbar region, initial encounter for fracture: Secondary | ICD-10-CM | POA: Diagnosis present

## 2015-06-21 DIAGNOSIS — J449 Chronic obstructive pulmonary disease, unspecified: Secondary | ICD-10-CM | POA: Diagnosis not present

## 2015-06-21 DIAGNOSIS — J45909 Unspecified asthma, uncomplicated: Secondary | ICD-10-CM | POA: Diagnosis not present

## 2015-06-21 DIAGNOSIS — I120 Hypertensive chronic kidney disease with stage 5 chronic kidney disease or end stage renal disease: Secondary | ICD-10-CM | POA: Diagnosis not present

## 2015-06-21 DIAGNOSIS — C9000 Multiple myeloma not having achieved remission: Secondary | ICD-10-CM | POA: Insufficient documentation

## 2015-06-21 DIAGNOSIS — IMO0002 Reserved for concepts with insufficient information to code with codable children: Secondary | ICD-10-CM

## 2015-06-21 DIAGNOSIS — Z8249 Family history of ischemic heart disease and other diseases of the circulatory system: Secondary | ICD-10-CM | POA: Insufficient documentation

## 2015-06-21 DIAGNOSIS — Z86718 Personal history of other venous thrombosis and embolism: Secondary | ICD-10-CM | POA: Insufficient documentation

## 2015-06-21 DIAGNOSIS — Z87891 Personal history of nicotine dependence: Secondary | ICD-10-CM | POA: Insufficient documentation

## 2015-06-21 DIAGNOSIS — N186 End stage renal disease: Secondary | ICD-10-CM | POA: Insufficient documentation

## 2015-06-21 DIAGNOSIS — Z992 Dependence on renal dialysis: Secondary | ICD-10-CM | POA: Diagnosis not present

## 2015-06-21 DIAGNOSIS — M8458XA Pathological fracture in neoplastic disease, other specified site, initial encounter for fracture: Secondary | ICD-10-CM | POA: Diagnosis not present

## 2015-06-21 LAB — PROTIME-INR
INR: 0.98 (ref 0.00–1.49)
PROTHROMBIN TIME: 13.2 s (ref 11.6–15.2)

## 2015-06-21 LAB — BASIC METABOLIC PANEL
Anion gap: 12 (ref 5–15)
BUN: 20 mg/dL (ref 6–20)
CO2: 30 mmol/L (ref 22–32)
CREATININE: 6.28 mg/dL — AB (ref 0.61–1.24)
Calcium: 10 mg/dL (ref 8.9–10.3)
Chloride: 93 mmol/L — ABNORMAL LOW (ref 101–111)
GFR, EST AFRICAN AMERICAN: 9 mL/min — AB (ref >60)
GFR, EST NON AFRICAN AMERICAN: 7 mL/min — AB (ref >60)
Glucose, Bld: 89 mg/dL (ref 65–99)
Potassium: 4.4 mmol/L (ref 3.5–5.1)
SODIUM: 135 mmol/L (ref 135–145)

## 2015-06-21 LAB — APTT: APTT: 26 s (ref 24–37)

## 2015-06-21 LAB — CBC WITH DIFFERENTIAL/PLATELET
BASOS ABS: 0 10*3/uL (ref 0.0–0.1)
BASOS PCT: 0 %
EOS ABS: 0.1 10*3/uL (ref 0.0–0.7)
EOS PCT: 1 %
HCT: 29.8 % — ABNORMAL LOW (ref 39.0–52.0)
Hemoglobin: 10.1 g/dL — ABNORMAL LOW (ref 13.0–17.0)
Lymphocytes Relative: 23 %
Lymphs Abs: 1.8 10*3/uL (ref 0.7–4.0)
MCH: 35.6 pg — ABNORMAL HIGH (ref 26.0–34.0)
MCHC: 33.9 g/dL (ref 30.0–36.0)
MCV: 104.9 fL — ABNORMAL HIGH (ref 78.0–100.0)
Monocytes Absolute: 1.6 10*3/uL — ABNORMAL HIGH (ref 0.1–1.0)
Monocytes Relative: 20 %
Neutro Abs: 4.5 10*3/uL (ref 1.7–7.7)
Neutrophils Relative %: 56 %
PLATELETS: 142 10*3/uL — AB (ref 150–400)
RBC: 2.84 MIL/uL — AB (ref 4.22–5.81)
RDW: 13.3 % (ref 11.5–15.5)
WBC: 8.1 10*3/uL (ref 4.0–10.5)

## 2015-06-21 MED ORDER — NALOXONE HCL 0.4 MG/ML IJ SOLN
INTRAMUSCULAR | Status: AC
  Filled 2015-06-21: qty 1

## 2015-06-21 MED ORDER — CEFAZOLIN SODIUM-DEXTROSE 2-3 GM-% IV SOLR
INTRAVENOUS | Status: AC
  Filled 2015-06-21: qty 50

## 2015-06-21 MED ORDER — CEFAZOLIN SODIUM-DEXTROSE 2-3 GM-% IV SOLR
2.0000 g | INTRAVENOUS | Status: AC
  Administered 2015-06-21: 2 g via INTRAVENOUS

## 2015-06-21 MED ORDER — FENTANYL CITRATE (PF) 100 MCG/2ML IJ SOLN
INTRAMUSCULAR | Status: AC | PRN
  Administered 2015-06-21 (×2): 25 ug via INTRAVENOUS
  Administered 2015-06-21 (×2): 12.5 ug via INTRAVENOUS
  Administered 2015-06-21: 25 ug via INTRAVENOUS

## 2015-06-21 MED ORDER — MIDAZOLAM HCL 2 MG/2ML IJ SOLN
INTRAMUSCULAR | Status: AC
  Filled 2015-06-21: qty 2

## 2015-06-21 MED ORDER — FENTANYL CITRATE (PF) 100 MCG/2ML IJ SOLN
INTRAMUSCULAR | Status: AC
  Filled 2015-06-21: qty 4

## 2015-06-21 MED ORDER — FLUMAZENIL 0.5 MG/5ML IV SOLN
INTRAVENOUS | Status: AC
  Filled 2015-06-21: qty 5

## 2015-06-21 MED ORDER — BUPIVACAINE HCL (PF) 0.25 % IJ SOLN
INTRAMUSCULAR | Status: AC
  Filled 2015-06-21: qty 30

## 2015-06-21 MED ORDER — HYDROMORPHONE HCL 1 MG/ML IJ SOLN
INTRAMUSCULAR | Status: AC | PRN
  Administered 2015-06-21 (×2): 1 mg via INTRAVENOUS

## 2015-06-21 MED ORDER — HYDROMORPHONE HCL 1 MG/ML IJ SOLN
INTRAMUSCULAR | Status: AC
  Filled 2015-06-21: qty 1

## 2015-06-21 MED ORDER — FENTANYL CITRATE (PF) 100 MCG/2ML IJ SOLN
INTRAMUSCULAR | Status: AC
  Filled 2015-06-21: qty 2

## 2015-06-21 MED ORDER — TOBRAMYCIN SULFATE 1.2 G IJ SOLR
INTRAMUSCULAR | Status: AC
  Filled 2015-06-21: qty 1.2

## 2015-06-21 MED ORDER — HYDROMORPHONE HCL 1 MG/ML IJ SOLN
INTRAMUSCULAR | Status: AC
  Filled 2015-06-21: qty 2

## 2015-06-21 MED ORDER — MIDAZOLAM HCL 2 MG/2ML IJ SOLN
INTRAMUSCULAR | Status: AC
  Filled 2015-06-21: qty 4

## 2015-06-21 MED ORDER — MIDAZOLAM HCL 2 MG/2ML IJ SOLN
INTRAMUSCULAR | Status: AC | PRN
  Administered 2015-06-21: 1 mg via INTRAVENOUS
  Administered 2015-06-21 (×2): 0.5 mg via INTRAVENOUS
  Administered 2015-06-21 (×2): 1 mg via INTRAVENOUS

## 2015-06-21 MED ORDER — SODIUM CHLORIDE 0.9 % IV SOLN: INTRAVENOUS | Status: AC

## 2015-06-21 MED ORDER — SODIUM CHLORIDE 0.9 % IV SOLN
INTRAVENOUS | Status: DC
  Administered 2015-06-21: 10:00:00 via INTRAVENOUS

## 2015-06-21 NOTE — Sedation Documentation (Signed)
Patient is resting comfortably. 

## 2015-06-21 NOTE — Discharge Instructions (Addendum)
1. No stooping,bending or lifting more than 10 lbs for 2 weeks. °2.Use Bari Leib to ambulate for 2 weeks . °3.RTC in 2 weeks °KYPHOPLASTY/VERTEBROPLASTY DISCHARGE INSTRUCTIONS ° °Medications: (check all that apply) ° °   Resume all home medications as before procedure. °   °           °  °Continue your pain medications as prescribed as needed.  Over the next 3-5 days, decrease your pain medication as tolerated.  Over the counter medications (i.e. Tylenol, ibuprofen, and aleve) may be substituted once severe/moderate pain symptoms have subsided. ° ° Wound Care: °- Bandages may be removed the day following your procedure.  You may get your incision wet once bandages are removed.  Bandaids may be used to cover the incisions until scab formation.  Topical ointments are optional. ° °- If you develop a fever greater than 101 degrees, have increased skin redness at the incision sites or pus-like oozing from incisions occurring within 1 week of the procedure, contact radiology at [phone] or [phone]. ° °- Ice pack to back for 15-20 minutes 2-3 time per day for first 2-3 days post procedure.  The ice will expedite muscle healing and help with the pain from the incisions. ° ° Activity: °- Bedrest today with limited activity for 24 hours post procedure. ° °- No driving for 48 hours. ° °- Increase your activity as tolerated after bedrest (with assistance if necessary). ° °- Refrain from any strenuous activity or heavy lifting (greater than 10 lbs.). ° ° Follow up: °- Contact radiology at [phone] or [phone] if any questions/concerns. ° °- A physician assistant from radiology will contact you in approximately 1 week. ° °- If a biopsy was performed at the time of your procedure, your referring physician should receive the results in usually 2-3 days. ° ° ° ° ° ° ° ° °

## 2015-06-21 NOTE — Sedation Documentation (Signed)
Pt C/O pain and trying to move in bed.

## 2015-06-21 NOTE — Progress Notes (Signed)
Aldrete score of 7 on arrival form radiology. Sleepy/ arousable. Slow to follow commands.

## 2015-06-21 NOTE — H&P (Signed)
Chief Complaint: Back pain, L2 fracture  Referring Physician(s): Husain,K  History of Present Illness: Justin Woods is a 81 y.o. male who has been referred for evaluation to treat severe pain due to pathologiccompression fracture at L2.The patient was diagnosed to have multiple myeloma following admission for renal failure sometime in June of this year.The patient was subsequently discharged and was doing reasonably well up until approximately 3 weeks ago when he started experiencing significantly worsening low back pain progressing to severe pain which he describes as a constant 10 out of 10 pain with moderate relief on a combination of Oxycontin and also tramadol for which he is taking around the clock. However this has resulted in him being significantly constipated.There is no radiation of this pain into the lower extremities. No autonomic dysfunction of bowel or bladder activity. Patient denies any recent chills, fever or rigors. His appetite remains decreased with him having lost about 30 pounds since June. He presents today for L2 coblation/vertebral body augmentation.  Past Medical History  Diagnosis Date  . Hypertension   . COPD (chronic obstructive pulmonary disease) ([place])   . Asthma   . History of kidney stones   . History of hiatal hernia   . Headache     HISTORY OF CLUSTER HEADACHES  . Arthritis   . Complication of anesthesia     TROUBLE WAKING UP  . H/O colonoscopy with polypectomy   . Allergic rhinitis   . DVT (deep venous thrombosis) ([facility])     RIGHT CALF AFTER A LITHOTRIPSY  . BPH (benign prostatic hypertrophy)   . Osteoarthritis   . Emphysema of lung ([place])   . Chronic kidney disease     dialysis - M/W/F  . Anemia   . Cancer ([place])     multiple myeloma  . Constipation   . Back pain     2 compressed disc diagnosed 10/29 after MRI    Past Surgical History  Procedure Laterality Date  . Lithotripsy    . Back surgery    . Arthroscopy right knee    .  Cholecystectomy    . Skin cancer removed from ears    . Insertion of dialysis catheter Right 02/07/2015    Procedure: INSERTION OF DIALYSIS CATHETER;  Surgeon: Rosetta Posner, MD;  Location: [facility];  Service: Vascular;  Laterality: Right;  . Av fistula placement Left 02/07/2015    Procedure: ARTERIOVENOUS (AV) FISTULA CREATION;  Surgeon: Rosetta Posner, MD;  Location: [facility];  Service: Vascular;  Laterality: Left;  . Removal of a dialysis catheter Left 02/07/2015    Procedure: Removal of  Temporary catheter in left neck;  Surgeon: Rosetta Posner, MD;  Location: [facility];  Service: Vascular;  Laterality: Left;  . Back surgery      1969  . Colonoscopy    . Ligation of competing branches of arteriovenous fistula Left 06/12/2015    Procedure: LIGATION OF COMPETING BRANCHES OF Left Arm ARTERIOVENOUS FISTULA;  Surgeon: Conrad Etowah, MD;  Location: [facility];  Service: Vascular;  Laterality: Left;    Allergies: Review of patient's allergies indicates no known allergies.  Medications: Prior to Admission medications   Medication Sig Start Date End Date Taking? Authorizing Provider  acyclovir (ZOVIRAX) 400 MG tablet Take 1 tablet (400 mg total) by mouth 2 (two) times daily. 02/21/15  Yes Curt Bears, MD  amLODipine (NORVASC) 5 MG tablet Take 5 mg by mouth daily.   Yes Historical Provider, MD  bisacodyl (DULCOLAX) 5 MG EC tablet Take 1 tablet (5 mg total) by mouth daily as needed for moderate constipation. 02/27/15  Yes Curt Bears, MD  Bortezomib (VELCADE IJ) Inject as directed.   Yes Historical Provider, MD  budesonide-formoterol (SYMBICORT) 80-4.5 MCG/ACT inhaler Inhale 2 puffs into the lungs 2 (two) times daily.   Yes Historical Provider, MD  calcium elemental as carbonate (TUMS ULTRA 1000) 400 MG chewable tablet Chew 2,000 mg by mouth 3 (three) times daily with meals. Using as a binder   Yes Historical Provider, MD  dexamethasone (DECADRON) 4 MG tablet Take 10 tablets (40 mg total) by mouth once a  week. Patient taking differently: Take 40 mg by mouth once a week. On Fridays before lunch 03/02/15  Yes Curt Bears, MD  fluticasone [facility]) 50 MCG/ACT nasal spray Place 1 spray into both nostrils daily.   Yes Historical Provider, MD  metoprolol succinate (TOPROL-XL) 25 MG 24 hr tablet Take 25 mg by mouth daily.   Yes Historical Provider, MD  omeprazole (PRILOSEC) 40 MG capsule Take 40 mg by mouth daily.   Yes Historical Provider, MD  oxyCODONE (OXY IR/ROXICODONE) 5 MG immediate release tablet Take 0.5 tablets (2.5 mg total) by mouth every 6 (six) hours as needed for severe pain. Take 1/2 tablet (2.5 mg) by mouth daily at bedtime, may take 1/2 tablet (2.5 mg) every 4 hours as needed for pain 06/12/15  Yes Alvia Grove, PA-C  traMADol (ULTRAM) 50 MG tablet Take 50-100 mg by mouth 3 (three) times daily as needed (pain).   Yes Historical Provider, MD  gabapentin (NEURONTIN) 100 MG capsule Take 1 capsule (100 mg total) by mouth at bedtime. 05/03/15   Donika Keith Rake, DO  meclizine (ANTIVERT) 25 MG tablet Take 1 tablet (25 mg total) by mouth 3 (three) times daily as needed for dizziness. 03/05/15   Julianne Rice, MD     Family History  Problem Relation Age of Onset  . Hypertension Mother   . Hypertension Father   . Cancer Brother   . Cancer Brother   . Healthy Son     Social History   Social History  . Marital Status: Married    Spouse Name: N/A  . Number of Children: N/A  . Years of Education: N/A   Social History Main Topics  . Smoking status: Former Smoker -- 2.00 packs/day    Types: Cigarettes    Quit date: 06/11/1995  . Smokeless tobacco: Never Used  . Alcohol Use: No  . Drug Use: No  . Sexual Activity: No   Other Topics Concern  . Not on file   Social History Narrative   Lives with wife in a one story home. Married to wife since 1955.  Has 2 children.     Retired from [employer].     Education: 8th grade.     Review of Systems: A 12 point ROS discussed and  pertinent positives are indicated in the HPI above.  All other systems are negative.  Review of Systems see above  Vital Signs: BP 159/69 mmHg  Pulse 89  Temp(Src) 98 F (36.7 C)  Resp 18  Ht $R'5\' 6"'iu$  (1.676 m)  Wt 180 lb (81.647 kg)  BMI 29.07 kg/m2  SpO2 94%  Physical Exam  Constitutional: He is oriented to person, place, and time. He appears well-developed and well-nourished.  Cardiovascular: Normal rate and regular rhythm.   Left forearm AVF with good thrill/bruit; clean, intact right chest wall venous cath  Pulmonary/Chest: Effort normal.  Sl dim BS bases  Abdominal: Soft. Bowel sounds are normal.  Musculoskeletal: He exhibits no edema.  Mod paravertebral tenderness at L2-3 region  Neurological: He is alert and oriented to person, place, and time.    Mallampati Score:     Imaging: Dg Lumbar Spine 2-3 Views  05/29/2015  CLINICAL DATA:  Low back pain.  Myeloma EXAM: LUMBAR SPINE - 2-3 VIEW COMPARISON:  CT abdomen pelvis 01/30/2015 FINDINGS: Anterior wedging of L4 is chronic and unchanged. Mild anterior wedging of L2 has developed since the prior study and could be acute. Mild depression of the L1 superior endplate also has developed since the prior study. Dextroscoliosis at L4.  Disc degeneration at L3-4 L4-5 and L5-S1. No lytic or sclerotic bone lesion. IMPRESSION: Mild vertebral body compression deformities at L1 and L2 have developed since the prior study and could represent recent fracture. Correlate with symptoms. MRI may be helpful for further evaluation Chronic compression of L4 is stable. Lumbar disc degeneration at L4-5 and L5-S1. Electronically Signed   By: Franchot Gallo M.D.   On: 05/29/2015 11:58   Mr Lumbar Spine Wo Contrast  06/10/2015  CLINICAL DATA:  Low back pain with compression fractures on radiographs. Multiple myeloma and renal failure. EXAM: MRI LUMBAR SPINE WITHOUT CONTRAST TECHNIQUE: Multiplanar, multisequence MR imaging of the lumbar spine was  performed. No intravenous contrast was administered. COMPARISON:  Radiographs 05/29/2015 abdominal CT 01/30/2015 and lumbar MRI 07/27/2012. FINDINGS: There are 5 lumbar type vertebral bodies demonstrating similar alignment with a mild convex right scoliosis. The patient has widespread marrow abnormalities consistent with diffuse myelomatous involvement. Lesions are demonstrated throughout the vertebral bodies, their posterior elements and upper pelvis. There is a 2.3 cm lesion in the right aspect of the sacrum. There is a large partially imaged lesion in the right iliac bone, measuring up to 3.6 cm on axial image 38. Chronic L4 compression deformity appears unchanged. There is a new biconcave fracture at L2 which is likely pathologic and associated with mild marrow edema. There is no osseous retropulsion or associated epidural tumor. No other fractures are identified. L1 appears intact. The conus medullaris extends to the L1-2 level and appears normal. No paraspinal abnormalities are identified.There are left renal cysts. There are no significant disc space findings at T11-12, T12-L1 or L1-2. L2-3: Disc bulging and facet hypertrophy contribute to borderline spinal stenosis. There is no nerve root encroachment. L3-4: Annular disc bulging, facet and ligamentous hypertrophy contribute to mild spinal stenosis, similar to the prior examination. No foraminal compromise. L4-5: Remote postsurgical changes are present on right. Progressive loss of disc height with probable ankylosis on the left. There is annular disc bulging with paraspinal osteophytes asymmetric to the left. Moderate facet and ligamentous hypertrophy is present. These factors contribute to stable mild spinal stenosis and mild narrowing of the lateral recesses and foramina bilaterally. L5-S1: Annular disc bulging paraspinal osteophytes are asymmetric to the right. Facet hypertrophy is also worse on the right. These factors contribute to mild narrowing of the  lateral recesses and foramina, right greater than left. IMPRESSION: 1. Widespread marrow abnormalities throughout the spine and pelvis consistent with multiple myeloma. 2. New biconcave compression fracture at L2, likely pathologic. No osseous retropulsion or epidural tumor demonstrated. No other acute osseous findings. 3. Multilevel spondylosis appears only mildly progressive from previous MRI. No high-grade spinal stenosis or definite nerve root encroachment. Electronically Signed   By: Richardean Sale M.D.   On: 06/10/2015 12:38   Ir  Radiologist Eval & Mgmt  06/18/2015  EXAM: NEW PATIENT OFFICE VISIT CHIEF COMPLAINT: Severe low back pain secondary to compression fracture at L2. Current Pain Level: 1-10 HISTORY OF PRESENT ILLNESS: The patient is an 81 year old right-handed gentleman who has been referred for evaluation to treat severe pain due to pathologic compression fracture at L2. The patient is accompanied by his wife and a grandson. The patient was diagnosed to have multiple myeloma following admission for renal failure sometime in June of this year. The patient was subsequently discharged and was doing reasonably well up until approximately 3 weeks ago when he started experiencing significantly worsening low back pain progressing to severe pain which he describes as a constant 10 out of 10 pain with moderate relief on a combination of Oxycontin and also tramadol for which she is taking around the clock. However this has resulted in him being significantly constipated. There is no radiation of this pain into the lower extremities. No autonomic dysfunction of bowel or bladder activity. Patient denies any recent chills, fever or rigors. His appetite remains decreased with him having lost about 30 pounds since June. Past Medical History: Hypertension. Osteoarthritis of the knee and hand. BPH. GERD. COPD PFT. Allergic rhinitis. Asthma due to rhinitis. Left basal cell skin cancer. Leg cramps. Right calf DVT in  2000. Lithotripsy. Patient was on Coumadin for six months. Kidney stone. Colonic polyps, small bowel incontinence. End-stage renal disease on hemodialysis. As mentioned above multiple myeloma being followed by oncology. Lumbar compression fractures as described. Medications: Albuterol. Omeprazole. Flonase. Symbicort. Amlodipine. Acyclovir. Compazine. Dexamethasone weekly prior to his chemo with Velcade. TUMS. Meclizine. Metoprolol. Oxycodone. Allergies: No known allergies. Social History: Patient is married, has two children alive and well. Patient does not smoke. Denies drinking alcohol or use of illicit medicines. Family History: Significant for cancer. Diabetes mellitus, high blood pressure. REVIEW OF SYSTEMS: Apart from above, essentially negative for pathologic symptomatology. PHYSICAL EXAMINATION: In significant distress on account of severe pain. Appears somewhat fidgety because of pain in his low back. Affect depressed. Neurologically otherwise intact. Alert, awake, oriented to time, place, space. Speech and comprehension within normal limits. No lateralizing cranial nerve abnormalities, motor, sensory or coordination abnormalities. Station and gait not tested. Patient significantly tender in the lower lumbar region at the site of L2-L3. ASSESSMENT AND PLAN: The patient's recent MRI scan of the lumbosacral spine was reviewed with the patient, patient's wife and grandson. Multiple focal areas of mild luminal involvement were brought to the attention in the thoracolumbar spine. Of note was the compression deformity at L2. It was felt the patient would be a candidate for ablation followed by vertebral body augmentation. Should at the time of the procedure interval change in terms of compression deformities are seen in the vertebral bodies in the lumbar spine, these too may be considered for treatment at that time. This was explained to the patient and the patient's wife. The procedure, the risks, benefits and  alternatives were all reviewed in detail. Questions were answered. Both the patient and the spouse want to proceed with the procedure. This will be scheduled at the earliest possible. They were asked to call should they have any concerns or questions. Electronically Signed   By: Luanne Bras M.D.   On: 06/15/2015 11:33    Labs:  CBC:  Recent Labs  05/31/15 1109 06/08/15 1511 06/12/15 0704 06/15/15 1405 06/21/15 0830  WBC 4.9 4.5  --  8.7 8.1  HGB 10.0* 9.5* 9.2* 9.3* 10.1*  HCT 29.5*  27.9* 27.0* 27.6* 29.8*  PLT 134* 136*  --  149 142*    COAGS:  Recent Labs  02/02/15 0454 02/07/15 0307 03/05/15 0520 06/21/15 0830  INR 1.11 1.18 1.07 0.98  APTT  --   --  23* 26    BMP:  Recent Labs  02/16/15 0556 02/16/15 0951  03/05/15 0520 03/05/15 0548  05/31/15 1109 06/08/15 1511 06/12/15 0704 06/15/15 1405 06/21/15 0830  NA 138 140  < > 136 139  < > 139 142 136 138 135  K 3.8 3.3*  < > 3.8 3.7  < > 4.6 4.2 4.8 3.9 4.4  CL 101 101  --  99* 100*  --   --   --   --   --  93*  CO2 27 28  < > 23  --   < > 29 32*  --  33* 30  GLUCOSE 90 124*  < > 88 85  < > 83 146* 90 156* 89  BUN 26* 27*  < > 63* 60*  < > 23.8 10.0  --  12.7 20  CALCIUM 8.3* 8.6*  < > 9.0  --   < > 9.0 8.6  --  8.8 10.0  CREATININE 8.00* 8.28*  < > 11.98* 12.70*  < > 6.6* 3.8*  --  4.3* 6.28*  GFRNONAA 5* 5*  --  3*  --   --   --   --   --   --  7*  GFRAA 6* 6*  --  4*  --   --   --   --   --   --  9*  < > = values in this interval not displayed.  LIVER FUNCTION TESTS:  Recent Labs  05/25/15 1427 05/31/15 1109 06/08/15 1511 06/15/15 1405  BILITOT 0.77 0.91 0.65 0.75  AST $Re'20 19 29 'iCR$ 32  ALT $Re'15 16 25 26  'DTS$ ALKPHOS 95 84 93 111  PROT 6.0* 5.9* 6.0* 6.0*  ALBUMIN 3.4* 3.4* 3.3* 3.0*    TUMOR MARKERS: No results for input(s): AFPTM, CEA, CA199, CHROMGRNA in the last 8760 hours.  Assessment and Plan: Pt with hx multiple myeloma, CKD, OA, COPD, prior RLE  DVT, persistent moderate to severe low  back pain and L2 compression fracture as well as widespread marrow abnormalities throughout spine/pelvis on recent MRI. Plan is for L2 coblation/vertebral body augmentation and possible additional augmentation if necessary based on today's imaging findings. Details/risks of procedure, including but not limited to, internal bleeding, infection, nerve injury, inability to control back pain d/w pt/family with their understanding and consent.    Thank you for this interesting consult.  I greatly enjoyed meeting Justin Woods and look forward to participating in their care.  A copy of this report was sent to the requesting provider on this date.  Signed: D. Rowe Robert 06/21/2015, 9:37 AM   I spent a total of 15 minutes in face to face in clinical consultation, greater than 50% of which was counseling/coordinating care for L2 coblation/vertebral body augmentation

## 2015-06-21 NOTE — Procedures (Signed)
S/P L2 and L4 tumor ablation with vertebral body augmentation

## 2015-06-22 ENCOUNTER — Telehealth: Payer: Self-pay

## 2015-06-22 ENCOUNTER — Ambulatory Visit (HOSPITAL_BASED_OUTPATIENT_CLINIC_OR_DEPARTMENT_OTHER): Payer: PPO | Admitting: Nurse Practitioner

## 2015-06-22 ENCOUNTER — Other Ambulatory Visit (HOSPITAL_BASED_OUTPATIENT_CLINIC_OR_DEPARTMENT_OTHER): Payer: PPO

## 2015-06-22 ENCOUNTER — Ambulatory Visit (HOSPITAL_BASED_OUTPATIENT_CLINIC_OR_DEPARTMENT_OTHER): Payer: PPO

## 2015-06-22 ENCOUNTER — Ambulatory Visit: Payer: PPO

## 2015-06-22 VITALS — BP 161/73 | HR 90 | Temp 98.1°F | Resp 18 | Ht 66.0 in | Wt 184.3 lb

## 2015-06-22 DIAGNOSIS — N19 Unspecified kidney failure: Secondary | ICD-10-CM

## 2015-06-22 DIAGNOSIS — C9000 Multiple myeloma not having achieved remission: Secondary | ICD-10-CM

## 2015-06-22 DIAGNOSIS — N289 Disorder of kidney and ureter, unspecified: Secondary | ICD-10-CM | POA: Diagnosis not present

## 2015-06-22 DIAGNOSIS — Z5112 Encounter for antineoplastic immunotherapy: Secondary | ICD-10-CM

## 2015-06-22 DIAGNOSIS — Z992 Dependence on renal dialysis: Secondary | ICD-10-CM

## 2015-06-22 LAB — COMPREHENSIVE METABOLIC PANEL (CC13)
ALBUMIN: 3.1 g/dL — AB (ref 3.5–5.0)
ALK PHOS: 128 U/L (ref 40–150)
ALT: 10 U/L (ref 0–55)
ANION GAP: 14 meq/L — AB (ref 3–11)
AST: 38 U/L — ABNORMAL HIGH (ref 5–34)
BILIRUBIN TOTAL: 0.59 mg/dL (ref 0.20–1.20)
BUN: 42.9 mg/dL — ABNORMAL HIGH (ref 7.0–26.0)
CALCIUM: 10.8 mg/dL — AB (ref 8.4–10.4)
CO2: 27 mEq/L (ref 22–29)
CREATININE: 9.6 mg/dL — AB (ref 0.7–1.3)
Chloride: 98 mEq/L (ref 98–109)
EGFR: 4 mL/min/{1.73_m2} — AB (ref >90)
Glucose: 152 mg/dl — ABNORMAL HIGH (ref 70–140)
Potassium: 5.1 mEq/L (ref 3.5–5.1)
Sodium: 139 mEq/L (ref 136–145)
TOTAL PROTEIN: 6.3 g/dL — AB (ref 6.4–8.3)

## 2015-06-22 LAB — CBC WITH DIFFERENTIAL/PLATELET
BASO%: 1 % (ref 0.0–2.0)
BASOS ABS: 0.1 10*3/uL (ref 0.0–0.1)
EOS ABS: 0.1 10*3/uL (ref 0.0–0.5)
EOS%: 1.1 % (ref 0.0–7.0)
HCT: 28.2 % — ABNORMAL LOW (ref 38.4–49.9)
HEMOGLOBIN: 9.4 g/dL — AB (ref 13.0–17.1)
LYMPH%: 10.6 % — ABNORMAL LOW (ref 14.0–49.0)
MCH: 35.6 pg — AB (ref 27.2–33.4)
MCHC: 33.3 g/dL (ref 32.0–36.0)
MCV: 107.1 fL — AB (ref 79.3–98.0)
MONO#: 0.7 10*3/uL (ref 0.1–0.9)
MONO%: 5.6 % (ref 0.0–14.0)
NEUT%: 81.7 % — ABNORMAL HIGH (ref 39.0–75.0)
NEUTROS ABS: 9.6 10*3/uL — AB (ref 1.5–6.5)
PLATELETS: 147 10*3/uL (ref 140–400)
RBC: 2.64 10*6/uL — ABNORMAL LOW (ref 4.20–5.82)
RDW: 14.2 % (ref 11.0–14.6)
WBC: 11.7 10*3/uL — AB (ref 4.0–10.3)
lymph#: 1.2 10*3/uL (ref 0.9–3.3)

## 2015-06-22 LAB — TECHNOLOGIST REVIEW

## 2015-06-22 MED ORDER — BORTEZOMIB CHEMO SQ INJECTION 3.5 MG (2.5MG/ML)
1.3000 mg/m2 | Freq: Once | INTRAMUSCULAR | Status: AC
  Administered 2015-06-22: 2.5 mg via SUBCUTANEOUS
  Filled 2015-06-22: qty 2.5

## 2015-06-22 MED ORDER — ONDANSETRON HCL 8 MG PO TABS
8.0000 mg | ORAL_TABLET | Freq: Once | ORAL | Status: AC
  Administered 2015-06-22: 8 mg via ORAL

## 2015-06-22 MED ORDER — ONDANSETRON HCL 8 MG PO TABS
ORAL_TABLET | ORAL | Status: AC
  Filled 2015-06-22: qty 1

## 2015-06-22 NOTE — Progress Notes (Signed)
Per Ned Card, NP, okay to treat with creatinine 9.6 today.

## 2015-06-22 NOTE — Telephone Encounter (Signed)
As per Owens Shark call placed to [facility] [phone] to inquire if Mr. Bores had and appt. Tomorrow. Spoke with Pam at [facility] she stated the pt. Has an appt. At 640 AM on 06/23/15 Also requested a copy of monthly labs. Fax received

## 2015-06-22 NOTE — Progress Notes (Signed)
Pt labs reviewed by Dr. Benay Spice, aware pt missed Dialysis - Verbal Order " ok to treat."

## 2015-06-22 NOTE — Patient Instructions (Addendum)
Justin Woods Discharge Instructions for Patients Receiving Chemotherapy  Today you received the following chemotherapy agents: Velcade  You are scheduled for dialysis tomorrow at 6:40am.  To help prevent nausea and vomiting after your treatment, we encourage you to take your nausea medication as prescribed. If you develop nausea and vomiting that is not controlled by your nausea medication, call the clinic.   BELOW ARE SYMPTOMS THAT SHOULD BE REPORTED IMMEDIATELY:  *FEVER GREATER THAN 100.5 F  *CHILLS WITH OR WITHOUT FEVER  NAUSEA AND VOMITING THAT IS NOT CONTROLLED WITH YOUR NAUSEA MEDICATION  *UNUSUAL SHORTNESS OF BREATH  *UNUSUAL BRUISING OR BLEEDING  TENDERNESS IN MOUTH AND THROAT WITH OR WITHOUT PRESENCE OF ULCERS  *URINARY PROBLEMS  *BOWEL PROBLEMS  UNUSUAL RASH Items with * indicate a potential emergency and should be followed up as soon as possible.  Feel free to call the clinic you have any questions or concerns. The clinic phone number is [phone].  Please show the [place] at check-in to the Emergency Department and triage nurse.

## 2015-06-22 NOTE — Progress Notes (Signed)
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  CURRENT THERAPY: 1) hemodialysis on Monday, Wednesday and Friday weekly under the care of [facility]. 2) weekly subcutaneous Velcade 1.3 MG/M2 in addition to Decadron 40 mg by mouth weekly. Status post 19 cycles.   INTERVAL HISTORY:   Mr. Osuna returns as scheduled. He continues weekly subcutaneous Velcade and dexamethasone. He denies nausea/vomiting. No mouth sores. No diarrhea. He has intermittent constipation. He plans to begin a laxative this weekend. Back pain is significantly better following the vertebroplasty/radiofrequency tumor ablation procedure at L2 and L4 yesterday. He canceled his dialysis session for today and rescheduled to 06/23/2015.  Objective:  Vital signs in last 24 hours:  Blood pressure 161/73, pulse 90, temperature 98.1 F (36.7 C), temperature source Oral, resp. rate 18, height _0  (1.676 m), weight 184 lb 4.8 oz (83.598 kg), SpO2 100 %.    HEENT: No thrush or ulcers. Mucous membranes appear moist. Resp: Lungs clear bilaterally. Cardio: Regular rate and rhythm. GI: Abdomen soft and nontender. No organomegaly. Vascular: No leg edema. Neuro: Lower extremity motor strength 5 over 5.     Lab Results:  Lab Results  Component Value Date   WBC 11.7* 06/22/2015   HGB 9.4* 06/22/2015   HCT 28.2* 06/22/2015   MCV 107.1* 06/22/2015   PLT 147 06/22/2015   NEUTROABS 9.6* 06/22/2015    Imaging:  Ir Bone Tumor(s)rf Ablation  06/22/2015  CLINICAL DATA:  Patient with severely painful pathological compression fracture at L2 and L4. History of multiple myeloma. EXAM: IR VERTEBROPLASTY LUMBOSACRAL INJ L4 AND L2 RADIOFREQUENCY TUMOR ABLATION FOLLOWED BY VERTEBRAL BODY AUGMENTATION USING THE  VERTEBROPLASTY TECHNIQUE: MEDICATIONS: Versed 4 mg IV, Fentanyl 100 mcg IV.  2 mg of Dilaudid IV. ANESTHESIA/SEDATION: Total Moderate Sedation Time:  75 minutes. FLUOROSCOPY TIME:  22 minutes 36 seconds. PROCEDURE: Following a full explanation of the procedure along with the potential associated complications, an informed witnessed consent was obtained. The patient was placed prone on the fluoroscopic table. Nasal oxygen was administered. Physiologic monitoring was performed throughout the duration of the procedure. The skin overlying the lump region was prepped and draped in the usual sterile fashion. The L2 and L4 vertebral bodies were identified and the right/left pedicles at both L2 and L4 were then infiltrated with 0.25% bupivacaine. This was then followed by the advancement under biplane intermittent fluoroscopy of an 8 gauge Kyphon needle into the posterior 1/3 just anterior to the pedicles. Depth measurements for the probes was then undertaken. It was decided to use a 20 cm probes for radiofrequency ablation. These were then inserted under biplane intermittent fluoroscopy at both L2 and L4. Radiofrequency ablation was then undertaken over 15 minutes using the OsteoCool radiofrequency ablation system. At the end of this, the needles at both levels through both pedicles were then advanced to the junction of the anterior and middle one thirds. Methylmethacrylate mixture was then reconstituted with tobramycin in the Kyphon mixing system. This was then loaded onto the Kyphon injecting needles. Using biplane intermittent fluoroscopy, five bone filler equivalents were then injected at L2, and five at L4. Excellent filling was obtained in the AP and lateral projections. There was no extravasation seen into the adjacent disc spaces, or posteriorly into the spinal canal or into the paraspinous venous structures. The needles  were then retrieved and removed. Hemostasis was achieved at the skin entry sites. The patient  tolerated the procedure well. There were no acute complications. IMPRESSION: Status post L2 and L4 radiofrequency tumor ablation for pathologic compression fracture due to multiple myeloma at L2 and L4 followed by vertebral body augmentation using the vertebroplasty technique. Electronically Signed   By: Luanne Bras M.D.   On: 06/21/2015 13:13   Ir Bone Tumor(s)rf Ablation  06/22/2015  CLINICAL DATA:  Patient with severely painful pathological compression fracture at L2 and L4. History of multiple myeloma. EXAM: IR VERTEBROPLASTY LUMBOSACRAL INJ L4 AND L2 RADIOFREQUENCY TUMOR ABLATION FOLLOWED BY VERTEBRAL BODY AUGMENTATION USING THE VERTEBROPLASTY TECHNIQUE: MEDICATIONS: Versed 4 mg IV, Fentanyl 100 mcg IV.  2 mg of Dilaudid IV. ANESTHESIA/SEDATION: Total Moderate Sedation Time:  75 minutes. FLUOROSCOPY TIME:  22 minutes 36 seconds. PROCEDURE: Following a full explanation of the procedure along with the potential associated complications, an informed witnessed consent was obtained. The patient was placed prone on the fluoroscopic table. Nasal oxygen was administered. Physiologic monitoring was performed throughout the duration of the procedure. The skin overlying the lump region was prepped and draped in the usual sterile fashion. The L2 and L4 vertebral bodies were identified and the right/left pedicles at both L2 and L4 were then infiltrated with 0.25% bupivacaine. This was then followed by the advancement under biplane intermittent fluoroscopy of an 8 gauge Kyphon needle into the posterior 1/3 just anterior to the pedicles. Depth measurements for the probes was then undertaken. It was decided to use a 20 cm probes for radiofrequency ablation. These were then inserted under biplane intermittent fluoroscopy at both L2 and L4. Radiofrequency ablation was then undertaken over 15 minutes using the OsteoCool radiofrequency ablation system. At the end of this, the needles at both levels through both pedicles  were then advanced to the junction of the anterior and middle one thirds. Methylmethacrylate mixture was then reconstituted with tobramycin in the Kyphon mixing system. This was then loaded onto the Kyphon injecting needles. Using biplane intermittent fluoroscopy, five bone filler equivalents were then injected at L2, and five at L4. Excellent filling was obtained in the AP and lateral projections. There was no extravasation seen into the adjacent disc spaces, or posteriorly into the spinal canal or into the paraspinous venous structures. The needles were then retrieved and removed. Hemostasis was achieved at the skin entry sites. The patient tolerated the procedure well. There were no acute complications. IMPRESSION: Status post L2 and L4 radiofrequency tumor ablation for pathologic compression fracture due to multiple myeloma at L2 and L4 followed by vertebral body augmentation using the vertebroplasty technique. Electronically Signed   By: Luanne Bras M.D.   On: 06/21/2015 13:13   Ir Vertebroplasty Lumobsacral Inj  06/22/2015  CLINICAL DATA:  Patient with severely painful pathological compression fracture at L2 and L4. History of multiple myeloma. EXAM: IR VERTEBROPLASTY LUMBOSACRAL INJ L4 AND L2 RADIOFREQUENCY TUMOR ABLATION FOLLOWED BY VERTEBRAL BODY AUGMENTATION USING THE VERTEBROPLASTY TECHNIQUE: MEDICATIONS: Versed 4 mg IV, Fentanyl 100 mcg IV.  2 mg of Dilaudid IV. ANESTHESIA/SEDATION: Total Moderate Sedation Time:  75 minutes. FLUOROSCOPY TIME:  22 minutes 36 seconds. PROCEDURE: Following a full explanation of the procedure along with the potential associated complications, an informed witnessed consent was obtained. The patient was placed prone on the fluoroscopic table. Nasal oxygen was administered. Physiologic monitoring was performed throughout the duration of the procedure. The skin overlying the lump region was prepped and draped in the usual  sterile fashion. The L2 and L4 vertebral  bodies were identified and the right/left pedicles at both L2 and L4 were then infiltrated with 0.25% bupivacaine. This was then followed by the advancement under biplane intermittent fluoroscopy of an 8 gauge Kyphon needle into the posterior 1/3 just anterior to the pedicles. Depth measurements for the probes was then undertaken. It was decided to use a 20 cm probes for radiofrequency ablation. These were then inserted under biplane intermittent fluoroscopy at both L2 and L4. Radiofrequency ablation was then undertaken over 15 minutes using the OsteoCool radiofrequency ablation system. At the end of this, the needles at both levels through both pedicles were then advanced to the junction of the anterior and middle one thirds. Methylmethacrylate mixture was then reconstituted with tobramycin in the Kyphon mixing system. This was then loaded onto the Kyphon injecting needles. Using biplane intermittent fluoroscopy, five bone filler equivalents were then injected at L2, and five at L4. Excellent filling was obtained in the AP and lateral projections. There was no extravasation seen into the adjacent disc spaces, or posteriorly into the spinal canal or into the paraspinous venous structures. The needles were then retrieved and removed. Hemostasis was achieved at the skin entry sites. The patient tolerated the procedure well. There were no acute complications. IMPRESSION: Status post L2 and L4 radiofrequency tumor ablation for pathologic compression fracture due to multiple myeloma at L2 and L4 followed by vertebral body augmentation using the vertebroplasty technique. Electronically Signed   By: Luanne Bras M.D.   On: 06/21/2015 13:13   Ir Vertebroplasty Add'l Injection  06/22/2015  CLINICAL DATA:  Patient with severely painful pathological compression fracture at L2 and L4. History of multiple myeloma. EXAM: IR VERTEBROPLASTY LUMBOSACRAL INJ L4 AND L2 RADIOFREQUENCY TUMOR ABLATION FOLLOWED BY VERTEBRAL BODY  AUGMENTATION USING THE VERTEBROPLASTY TECHNIQUE: MEDICATIONS: Versed 4 mg IV, Fentanyl 100 mcg IV.  2 mg of Dilaudid IV. ANESTHESIA/SEDATION: Total Moderate Sedation Time:  75 minutes. FLUOROSCOPY TIME:  22 minutes 36 seconds. PROCEDURE: Following a full explanation of the procedure along with the potential associated complications, an informed witnessed consent was obtained. The patient was placed prone on the fluoroscopic table. Nasal oxygen was administered. Physiologic monitoring was performed throughout the duration of the procedure. The skin overlying the lump region was prepped and draped in the usual sterile fashion. The L2 and L4 vertebral bodies were identified and the right/left pedicles at both L2 and L4 were then infiltrated with 0.25% bupivacaine. This was then followed by the advancement under biplane intermittent fluoroscopy of an 8 gauge Kyphon needle into the posterior 1/3 just anterior to the pedicles. Depth measurements for the probes was then undertaken. It was decided to use a 20 cm probes for radiofrequency ablation. These were then inserted under biplane intermittent fluoroscopy at both L2 and L4. Radiofrequency ablation was then undertaken over 15 minutes using the OsteoCool radiofrequency ablation system. At the end of this, the needles at both levels through both pedicles were then advanced to the junction of the anterior and middle one thirds. Methylmethacrylate mixture was then reconstituted with tobramycin in the Kyphon mixing system. This was then loaded onto the Kyphon injecting needles. Using biplane intermittent fluoroscopy, five bone filler equivalents were then injected at L2, and five at L4. Excellent filling was obtained in the AP and lateral projections. There was no extravasation seen into the adjacent disc spaces, or posteriorly into the spinal canal or into the paraspinous venous structures. The needles were then retrieved and removed.  Hemostasis was achieved at the skin  entry sites. The patient tolerated the procedure well. There were no acute complications. IMPRESSION: Status post L2 and L4 radiofrequency tumor ablation for pathologic compression fracture due to multiple myeloma at L2 and L4 followed by vertebral body augmentation using the vertebroplasty technique. Electronically Signed   By: Luanne Bras M.D.   On: 06/21/2015 13:13    Medications: I have reviewed the patient's current medications.  Assessment/Plan: 1. 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  2. Renal failure. He continues hemodialysis on a Monday Wednesday Friday schedule. 3. Status post vertebroplasty/radiofrequency tumor ablation L4 and L2 06/21/2015.   Disposition: Mr. Enck appears stable. He has completed 19 cycles of weekly subcutaneous Velcade and dexamethasone. Plan to proceed with cycle 20 today as scheduled. He will return for a follow-up visit with Dr. Julien Nordmann on 07/12/2015.  He notified us at today's visit that he canceled his dialysis session this morning and rescheduled to 06/23/2015. We will contact the dialysis Center to verify that appointment and will also forward a copy of today's labs.  I reviewed the above with Dr. Benay Spice in Dr. Worthy Flank absence.    Ned Card ANP/GNP-BC   06/22/2015  3:02 PM

## 2015-06-26 ENCOUNTER — Encounter: Payer: Self-pay | Admitting: Vascular Surgery

## 2015-06-26 ENCOUNTER — Other Ambulatory Visit (HOSPITAL_COMMUNITY): Payer: Self-pay | Admitting: Interventional Radiology

## 2015-06-26 DIAGNOSIS — IMO0002 Reserved for concepts with insufficient information to code with codable children: Secondary | ICD-10-CM

## 2015-06-27 ENCOUNTER — Encounter: Payer: PPO | Admitting: Vascular Surgery

## 2015-06-29 ENCOUNTER — Encounter: Payer: PPO | Admitting: Vascular Surgery

## 2015-06-29 ENCOUNTER — Ambulatory Visit: Payer: PPO

## 2015-06-29 ENCOUNTER — Ambulatory Visit (HOSPITAL_BASED_OUTPATIENT_CLINIC_OR_DEPARTMENT_OTHER): Payer: PPO | Admitting: *Deleted

## 2015-06-29 ENCOUNTER — Other Ambulatory Visit: Payer: PPO

## 2015-06-29 VITALS — BP 117/64 | HR 98 | Temp 98.7°F | Resp 18

## 2015-06-29 DIAGNOSIS — Z5112 Encounter for antineoplastic immunotherapy: Secondary | ICD-10-CM | POA: Diagnosis not present

## 2015-06-29 DIAGNOSIS — C9000 Multiple myeloma not having achieved remission: Secondary | ICD-10-CM

## 2015-06-29 LAB — COMPREHENSIVE METABOLIC PANEL (CC13)
ANION GAP: 12 meq/L — AB (ref 3–11)
AST: 39 U/L — AB (ref 5–34)
Albumin: 3.4 g/dL — ABNORMAL LOW (ref 3.5–5.0)
Alkaline Phosphatase: 109 U/L (ref 40–150)
BILIRUBIN TOTAL: 0.83 mg/dL (ref 0.20–1.20)
BUN: 12.5 mg/dL (ref 7.0–26.0)
CALCIUM: 10.2 mg/dL (ref 8.4–10.4)
CHLORIDE: 99 meq/L (ref 98–109)
CO2: 28 meq/L (ref 22–29)
CREATININE: 3.9 mg/dL — AB (ref 0.7–1.3)
EGFR: 13 mL/min/{1.73_m2} — ABNORMAL LOW (ref >90)
Glucose: 316 mg/dl — ABNORMAL HIGH (ref 70–140)
Potassium: 3.9 mEq/L (ref 3.5–5.1)
Sodium: 139 mEq/L (ref 136–145)
TOTAL PROTEIN: 6.6 g/dL (ref 6.4–8.3)

## 2015-06-29 LAB — CBC WITH DIFFERENTIAL/PLATELET
BASO%: 1.1 % (ref 0.0–2.0)
Basophils Absolute: 0.1 10*3/uL (ref 0.0–0.1)
EOS%: 1.3 % (ref 0.0–7.0)
Eosinophils Absolute: 0.1 10*3/uL (ref 0.0–0.5)
HEMATOCRIT: 28.1 % — AB (ref 38.4–49.9)
HGB: 9.5 g/dL — ABNORMAL LOW (ref 13.0–17.1)
LYMPH#: 1.2 10*3/uL (ref 0.9–3.3)
LYMPH%: 10.1 % — ABNORMAL LOW (ref 14.0–49.0)
MCH: 35.8 pg — ABNORMAL HIGH (ref 27.2–33.4)
MCHC: 33.7 g/dL (ref 32.0–36.0)
MCV: 106.4 fL — ABNORMAL HIGH (ref 79.3–98.0)
MONO#: 0.4 10*3/uL (ref 0.1–0.9)
MONO%: 3.3 % (ref 0.0–14.0)
NEUT%: 84.2 % — ABNORMAL HIGH (ref 39.0–75.0)
NEUTROS ABS: 9.9 10*3/uL — AB (ref 1.5–6.5)
PLATELETS: 125 10*3/uL — AB (ref 140–400)
RBC: 2.64 10*6/uL — AB (ref 4.20–5.82)
RDW: 14.5 % (ref 11.0–14.6)
WBC: 11.8 10*3/uL — AB (ref 4.0–10.3)

## 2015-06-29 LAB — TECHNOLOGIST REVIEW

## 2015-06-29 MED ORDER — BORTEZOMIB CHEMO SQ INJECTION 3.5 MG (2.5MG/ML)
1.3000 mg/m2 | Freq: Once | INTRAMUSCULAR | Status: AC
  Administered 2015-06-29: 2.5 mg via SUBCUTANEOUS
  Filled 2015-06-29: qty 2.5

## 2015-06-29 MED ORDER — ONDANSETRON HCL 8 MG PO TABS
ORAL_TABLET | ORAL | Status: AC
  Filled 2015-06-29: qty 1

## 2015-06-29 MED ORDER — ONDANSETRON HCL 8 MG PO TABS
8.0000 mg | ORAL_TABLET | Freq: Once | ORAL | Status: AC
  Administered 2015-06-29: 8 mg via ORAL

## 2015-06-29 NOTE — Patient Instructions (Signed)
[facility] Discharge Instructions for Patients Receiving Chemotherapy  Today you received the following chemotherapy agents Velcade. To help prevent nausea and vomiting after your treatment, we encourage you to take your nausea medication as directed.  If you develop nausea and vomiting that is not controlled by your nausea medication, call the clinic.   BELOW ARE SYMPTOMS THAT SHOULD BE REPORTED IMMEDIATELY:  *FEVER GREATER THAN 100.5 F  *CHILLS WITH OR WITHOUT FEVER  NAUSEA AND VOMITING THAT IS NOT CONTROLLED WITH YOUR NAUSEA MEDICATION  *UNUSUAL SHORTNESS OF BREATH  *UNUSUAL BRUISING OR BLEEDING  TENDERNESS IN MOUTH AND THROAT WITH OR WITHOUT PRESENCE OF ULCERS  *URINARY PROBLEMS  *BOWEL PROBLEMS  UNUSUAL RASH Items with * indicate a potential emergency and should be followed up as soon as possible.  Feel free to call the clinic you have any questions or concerns. The clinic phone number is [phone].  Please show the CHEMO ALERT CARD at check-in to the Emergency Department and triage nurse.    

## 2015-06-29 NOTE — Patient Instructions (Signed)
[facility] Discharge Instructions for Patients Receiving Chemotherapy  Today you received the following chemotherapy agents Velcade. To help prevent nausea and vomiting after your treatment, we encourage you to take your nausea medication as directed.  If you develop nausea and vomiting that is not controlled by your nausea medication, call the clinic.   BELOW ARE SYMPTOMS THAT SHOULD BE REPORTED IMMEDIATELY:  *FEVER GREATER THAN 100.5 F  *CHILLS WITH OR WITHOUT FEVER  NAUSEA AND VOMITING THAT IS NOT CONTROLLED WITH YOUR NAUSEA MEDICATION  *UNUSUAL SHORTNESS OF BREATH  *UNUSUAL BRUISING OR BLEEDING  TENDERNESS IN MOUTH AND THROAT WITH OR WITHOUT PRESENCE OF ULCERS  *URINARY PROBLEMS  *BOWEL PROBLEMS  UNUSUAL RASH Items with * indicate a potential emergency and should be followed up as soon as possible.  Feel free to call the clinic you have any questions or concerns. The clinic phone number is [phone].  Please show the CHEMO ALERT CARD at check-in to the Emergency Department and triage nurse.    

## 2015-06-29 NOTE — Progress Notes (Signed)
Okay to treat per Dr. Irene Limbo despite today's labs

## 2015-07-02 ENCOUNTER — Telehealth: Payer: Self-pay | Admitting: *Deleted

## 2015-07-02 ENCOUNTER — Other Ambulatory Visit: Payer: Self-pay | Admitting: Internal Medicine

## 2015-07-02 NOTE — Telephone Encounter (Signed)
Refill completed today.

## 2015-07-02 NOTE — Telephone Encounter (Signed)
Patient's wife Justin Woods called reporting he is out of acyclovir this morning.  Called [facility] [phone] and they've sent a request to office for refill  For acyclovir.  Return number when ready is [phone].

## 2015-07-06 ENCOUNTER — Ambulatory Visit: Payer: PPO

## 2015-07-06 ENCOUNTER — Other Ambulatory Visit: Payer: PPO

## 2015-07-06 ENCOUNTER — Encounter: Payer: PPO | Admitting: Nurse Practitioner

## 2015-07-06 ENCOUNTER — Emergency Department (HOSPITAL_COMMUNITY): Payer: PPO

## 2015-07-06 ENCOUNTER — Inpatient Hospital Stay (HOSPITAL_COMMUNITY)
Admission: EM | Admit: 2015-07-06 | Discharge: 2015-08-12 | DRG: 840 | Disposition: E | Payer: PPO | Attending: Internal Medicine | Admitting: Internal Medicine

## 2015-07-06 ENCOUNTER — Telehealth: Payer: Self-pay | Admitting: *Deleted

## 2015-07-06 ENCOUNTER — Encounter (HOSPITAL_COMMUNITY): Payer: Self-pay

## 2015-07-06 DIAGNOSIS — W19XXXA Unspecified fall, initial encounter: Secondary | ICD-10-CM | POA: Diagnosis present

## 2015-07-06 DIAGNOSIS — Z7951 Long term (current) use of inhaled steroids: Secondary | ICD-10-CM

## 2015-07-06 DIAGNOSIS — Z87891 Personal history of nicotine dependence: Secondary | ICD-10-CM | POA: Diagnosis not present

## 2015-07-06 DIAGNOSIS — Z515 Encounter for palliative care: Secondary | ICD-10-CM | POA: Diagnosis not present

## 2015-07-06 DIAGNOSIS — M549 Dorsalgia, unspecified: Secondary | ICD-10-CM

## 2015-07-06 DIAGNOSIS — D899 Disorder involving the immune mechanism, unspecified: Secondary | ICD-10-CM | POA: Diagnosis present

## 2015-07-06 DIAGNOSIS — Z809 Family history of malignant neoplasm, unspecified: Secondary | ICD-10-CM | POA: Diagnosis not present

## 2015-07-06 DIAGNOSIS — I120 Hypertensive chronic kidney disease with stage 5 chronic kidney disease or end stage renal disease: Secondary | ICD-10-CM | POA: Diagnosis present

## 2015-07-06 DIAGNOSIS — M545 Low back pain: Secondary | ICD-10-CM | POA: Diagnosis present

## 2015-07-06 DIAGNOSIS — D638 Anemia in other chronic diseases classified elsewhere: Secondary | ICD-10-CM | POA: Diagnosis present

## 2015-07-06 DIAGNOSIS — Z66 Do not resuscitate: Secondary | ICD-10-CM | POA: Diagnosis not present

## 2015-07-06 DIAGNOSIS — N2581 Secondary hyperparathyroidism of renal origin: Secondary | ICD-10-CM | POA: Diagnosis present

## 2015-07-06 DIAGNOSIS — Z8249 Family history of ischemic heart disease and other diseases of the circulatory system: Secondary | ICD-10-CM

## 2015-07-06 DIAGNOSIS — Z79899 Other long term (current) drug therapy: Secondary | ICD-10-CM | POA: Diagnosis not present

## 2015-07-06 DIAGNOSIS — C9000 Multiple myeloma not having achieved remission: Secondary | ICD-10-CM

## 2015-07-06 DIAGNOSIS — M4856XA Collapsed vertebra, not elsewhere classified, lumbar region, initial encounter for fracture: Secondary | ICD-10-CM | POA: Diagnosis present

## 2015-07-06 DIAGNOSIS — J449 Chronic obstructive pulmonary disease, unspecified: Secondary | ICD-10-CM | POA: Diagnosis present

## 2015-07-06 DIAGNOSIS — D72829 Elevated white blood cell count, unspecified: Secondary | ICD-10-CM

## 2015-07-06 DIAGNOSIS — G9340 Encephalopathy, unspecified: Secondary | ICD-10-CM

## 2015-07-06 DIAGNOSIS — Z86718 Personal history of other venous thrombosis and embolism: Secondary | ICD-10-CM

## 2015-07-06 DIAGNOSIS — K219 Gastro-esophageal reflux disease without esophagitis: Secondary | ICD-10-CM | POA: Diagnosis present

## 2015-07-06 DIAGNOSIS — R000 Tachycardia, unspecified: Secondary | ICD-10-CM | POA: Diagnosis present

## 2015-07-06 DIAGNOSIS — Z992 Dependence on renal dialysis: Secondary | ICD-10-CM | POA: Diagnosis not present

## 2015-07-06 DIAGNOSIS — N400 Enlarged prostate without lower urinary tract symptoms: Secondary | ICD-10-CM | POA: Diagnosis present

## 2015-07-06 DIAGNOSIS — R739 Hyperglycemia, unspecified: Secondary | ICD-10-CM

## 2015-07-06 DIAGNOSIS — D696 Thrombocytopenia, unspecified: Secondary | ICD-10-CM | POA: Diagnosis present

## 2015-07-06 DIAGNOSIS — R112 Nausea with vomiting, unspecified: Secondary | ICD-10-CM | POA: Diagnosis not present

## 2015-07-06 DIAGNOSIS — Z85828 Personal history of other malignant neoplasm of skin: Secondary | ICD-10-CM

## 2015-07-06 DIAGNOSIS — J45909 Unspecified asthma, uncomplicated: Secondary | ICD-10-CM | POA: Diagnosis present

## 2015-07-06 DIAGNOSIS — I10 Essential (primary) hypertension: Secondary | ICD-10-CM | POA: Diagnosis present

## 2015-07-06 DIAGNOSIS — R627 Adult failure to thrive: Secondary | ICD-10-CM | POA: Diagnosis present

## 2015-07-06 DIAGNOSIS — N186 End stage renal disease: Secondary | ICD-10-CM | POA: Diagnosis present

## 2015-07-06 DIAGNOSIS — C9002 Multiple myeloma in relapse: Secondary | ICD-10-CM | POA: Diagnosis not present

## 2015-07-06 LAB — BASIC METABOLIC PANEL
Anion gap: 13 (ref 5–15)
BUN: 39 mg/dL — AB (ref 6–20)
CO2: 25 mmol/L (ref 22–32)
CREATININE: 9.57 mg/dL — AB (ref 0.61–1.24)
Chloride: 98 mmol/L — ABNORMAL LOW (ref 101–111)
GFR calc Af Amer: 5 mL/min — ABNORMAL LOW (ref >60)
GFR, EST NON AFRICAN AMERICAN: 4 mL/min — AB (ref >60)
GLUCOSE: 158 mg/dL — AB (ref 65–99)
POTASSIUM: 5.1 mmol/L (ref 3.5–5.1)
SODIUM: 136 mmol/L (ref 135–145)

## 2015-07-06 LAB — CBC
HEMATOCRIT: 26.9 % — AB (ref 39.0–52.0)
Hemoglobin: 9.3 g/dL — ABNORMAL LOW (ref 13.0–17.0)
MCH: 36.5 pg — ABNORMAL HIGH (ref 26.0–34.0)
MCHC: 34.6 g/dL (ref 30.0–36.0)
MCV: 105.5 fL — ABNORMAL HIGH (ref 78.0–100.0)
PLATELETS: 78 10*3/uL — AB (ref 150–400)
RBC: 2.55 MIL/uL — ABNORMAL LOW (ref 4.22–5.81)
RDW: 14.2 % (ref 11.5–15.5)
WBC: 17.2 10*3/uL — AB (ref 4.0–10.5)

## 2015-07-06 LAB — CBG MONITORING, ED: GLUCOSE-CAPILLARY: 162 mg/dL — AB (ref 65–99)

## 2015-07-06 LAB — I-STAT CG4 LACTIC ACID, ED
LACTIC ACID, VENOUS: 1.08 mmol/L (ref 0.5–2.0)
LACTIC ACID, VENOUS: 1.01 mmol/L (ref 0.5–2.0)

## 2015-07-06 LAB — GLUCOSE, CAPILLARY: Glucose-Capillary: 160 mg/dL — ABNORMAL HIGH (ref 65–99)

## 2015-07-06 MED ORDER — OXYCODONE HCL 5 MG PO TABS
2.5000 mg | ORAL_TABLET | Freq: Four times a day (QID) | ORAL | Status: DC | PRN
  Administered 2015-07-07 – 2015-07-13 (×11): 2.5 mg via ORAL
  Filled 2015-07-06 (×13): qty 1

## 2015-07-06 MED ORDER — SODIUM CHLORIDE 0.9 % IV SOLN: 100.0000 mL | INTRAVENOUS | Status: DC | PRN

## 2015-07-06 MED ORDER — AMLODIPINE BESYLATE 5 MG PO TABS
5.0000 mg | ORAL_TABLET | Freq: Every day | ORAL | Status: DC
  Administered 2015-07-08 – 2015-07-11 (×4): 5 mg via ORAL
  Filled 2015-07-06 (×4): qty 1

## 2015-07-06 MED ORDER — GABAPENTIN 100 MG PO CAPS
100.0000 mg | ORAL_CAPSULE | Freq: Every day | ORAL | Status: DC
  Administered 2015-07-07 – 2015-07-13 (×8): 100 mg via ORAL
  Filled 2015-07-06 (×9): qty 1

## 2015-07-06 MED ORDER — INSULIN ASPART 100 UNIT/ML ~~LOC~~ SOLN
0.0000 [IU] | Freq: Three times a day (TID) | SUBCUTANEOUS | Status: DC
  Administered 2015-07-11: 1 [IU] via SUBCUTANEOUS

## 2015-07-06 MED ORDER — HEPARIN SODIUM (PORCINE) 5000 UNIT/ML IJ SOLN
5000.0000 [IU] | Freq: Three times a day (TID) | INTRAMUSCULAR | Status: DC
  Administered 2015-07-07 – 2015-07-13 (×15): 5000 [IU] via SUBCUTANEOUS
  Filled 2015-07-06 (×7): qty 1

## 2015-07-06 MED ORDER — LIDOCAINE-PRILOCAINE 2.5-2.5 % EX CREA: 1.0000 "application " | TOPICAL_CREAM | CUTANEOUS | Status: DC | PRN

## 2015-07-06 MED ORDER — MECLIZINE HCL 25 MG PO TABS
25.0000 mg | ORAL_TABLET | Freq: Three times a day (TID) | ORAL | Status: DC | PRN
  Filled 2015-07-06: qty 1

## 2015-07-06 MED ORDER — BUDESONIDE-FORMOTEROL FUMARATE 80-4.5 MCG/ACT IN AERO
2.0000 | INHALATION_SPRAY | Freq: Two times a day (BID) | RESPIRATORY_TRACT | Status: DC
  Administered 2015-07-07 – 2015-07-14 (×12): 2 via RESPIRATORY_TRACT
  Filled 2015-07-06: qty 6.9

## 2015-07-06 MED ORDER — METOPROLOL SUCCINATE ER 25 MG PO TB24
25.0000 mg | ORAL_TABLET | Freq: Every day | ORAL | Status: DC
  Administered 2015-07-08 – 2015-07-10 (×3): 25 mg via ORAL
  Filled 2015-07-06 (×3): qty 1

## 2015-07-06 MED ORDER — CALCIUM CARBONATE ANTACID 500 MG PO CHEW
2000.0000 mg | CHEWABLE_TABLET | Freq: Three times a day (TID) | ORAL | Status: DC
  Administered 2015-07-08: 2000 mg via ORAL
  Filled 2015-07-06: qty 10

## 2015-07-06 MED ORDER — SODIUM CHLORIDE 0.9 % IV SOLN
Freq: Once | INTRAVENOUS | Status: AC
  Administered 2015-07-06: 19:00:00 via INTRAVENOUS

## 2015-07-06 MED ORDER — SODIUM CHLORIDE 0.9 % IV SOLN: INTRAVENOUS | Status: DC

## 2015-07-06 MED ORDER — LIDOCAINE HCL (PF) 1 % IJ SOLN: 5.0000 mL | INTRAMUSCULAR | Status: DC | PRN

## 2015-07-06 MED ORDER — HEPARIN SODIUM (PORCINE) 1000 UNIT/ML DIALYSIS: 1000.0000 [IU] | INTRAMUSCULAR | Status: DC | PRN

## 2015-07-06 MED ORDER — SODIUM CHLORIDE 0.9 % IJ SOLN
3.0000 mL | Freq: Two times a day (BID) | INTRAMUSCULAR | Status: DC
  Administered 2015-07-07 – 2015-07-15 (×17): 3 mL via INTRAVENOUS

## 2015-07-06 MED ORDER — ACYCLOVIR 400 MG PO TABS
400.0000 mg | ORAL_TABLET | Freq: Two times a day (BID) | ORAL | Status: DC
  Administered 2015-07-07 – 2015-07-13 (×13): 400 mg via ORAL
  Filled 2015-07-06 (×19): qty 1

## 2015-07-06 MED ORDER — BISACODYL 5 MG PO TBEC
5.0000 mg | DELAYED_RELEASE_TABLET | Freq: Every day | ORAL | Status: DC | PRN
  Administered 2015-07-09: 5 mg via ORAL
  Filled 2015-07-06: qty 1

## 2015-07-06 MED ORDER — PENTAFLUOROPROP-TETRAFLUOROETH EX AERO: 1.0000 "application " | INHALATION_SPRAY | CUTANEOUS | Status: DC | PRN

## 2015-07-06 MED ORDER — FLUTICASONE PROPIONATE 50 MCG/ACT NA SUSP
1.0000 | Freq: Every day | NASAL | Status: DC
  Administered 2015-07-09 – 2015-07-13 (×4): 1 via NASAL
  Filled 2015-07-06: qty 16

## 2015-07-06 MED ORDER — PANTOPRAZOLE SODIUM 40 MG PO TBEC
40.0000 mg | DELAYED_RELEASE_TABLET | Freq: Every day | ORAL | Status: DC
  Administered 2015-07-08 – 2015-07-13 (×6): 40 mg via ORAL
  Filled 2015-07-06 (×7): qty 1

## 2015-07-06 MED ORDER — ALTEPLASE 2 MG IJ SOLR: 2.0000 mg | Freq: Once | INTRAMUSCULAR | Status: DC | PRN

## 2015-07-06 NOTE — ED Notes (Signed)
Nurse drawing labs. 

## 2015-07-06 NOTE — ED Provider Notes (Signed)
CSN: [id number]     Arrival date & time 07/01/2015  1442 History   First MD Initiated Contact with Patient 06/21/2015 1648     Chief Complaint  Patient presents with  . Back Pain  . Fall     (Consider location/radiation/quality/duration/timing/severity/associated sxs/prior Treatment) HPI 81 year old male who presents after fall. History of hypertension, COPD, multiple myeloma complicated by renal failure on hemodialysis. He is receiving weekly treatments of Velcade and Decadron. 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 Family states that pain has been well-controlled since his surgery, but that he has been having increasing episodes of confusion and weakness. Has had decreased appetite. This is that this is acutely been worsening over the course of the past 2 days. He has not had any fevers, cough, nausea or vomiting, diarrhea, or abdominal pain. Does make small amount of urine, and has not had any dysuria or frequency. Was ambulating with a walker today, and he had a witnessed fall where he was seen to have his legs give out from underneath him. He fell on his buttock. He did not have any LOC or head strike. Initially was complaining of some back pain, and was brought to the ED for evaluation. Denies any back pain or injuries in the ED.   Level 5 caveat due to mild confusion Past Medical History  Diagnosis Date  . Hypertension   . COPD (chronic obstructive pulmonary disease) ([place])   . Asthma   . History of kidney stones   . History of hiatal hernia   . Headache     HISTORY OF CLUSTER HEADACHES  . Arthritis   . Complication of anesthesia     TROUBLE WAKING UP  . H/O colonoscopy with polypectomy   . Allergic rhinitis   . DVT (deep venous thrombosis) (HCC)     RIGHT CALF AFTER A LITHOTRIPSY  . BPH (benign prostatic hypertrophy)   . Osteoarthritis   . Emphysema of lung ([place])   . Chronic kidney disease      dialysis - M/W/F  . Anemia   . Cancer ([place])     multiple myeloma  . Constipation   . Back pain     2 compressed disc diagnosed 10/29 after MRI   Past Surgical History  Procedure Laterality Date  . Lithotripsy    . Back surgery    . Arthroscopy right knee    . Cholecystectomy    . Skin cancer removed from ears    . Insertion of dialysis catheter Right 02/07/2015    Procedure: INSERTION OF DIALYSIS CATHETER;  Surgeon: Rosetta Posner, MD;  Location: [place];  Service: Vascular;  Laterality: Right;  . Av fistula placement Left 02/07/2015    Procedure: ARTERIOVENOUS (AV) FISTULA CREATION;  Surgeon: Rosetta Posner, MD;  Location: [place];  Service: Vascular;  Laterality: Left;  . Removal of a dialysis catheter Left 02/07/2015    Procedure: Removal of  Temporary catheter in left neck;  Surgeon: Rosetta Posner, MD;  Location: [place];  Service: Vascular;  Laterality: Left;  . Back surgery      1969  . Colonoscopy    . Ligation of competing branches of arteriovenous fistula Left 06/12/2015    Procedure: LIGATION OF COMPETING BRANCHES OF Left Arm ARTERIOVENOUS FISTULA;  Surgeon: Conrad Tenino, MD;  Location: [place];  Service: Vascular;  Laterality: Left;   Family History  Problem Relation Age of Onset  .  Hypertension Mother   . Hypertension Father   . Cancer Brother   . Cancer Brother   . Healthy Son    Social History  Substance Use Topics  . Smoking status: Former Smoker -- 2.00 packs/day    Types: Cigarettes    Quit date: 06/11/1995  . Smokeless tobacco: Never Used  . Alcohol Use: No    Review of Systems 10/14 systems reviewed and are negative other than those stated in the HPI    Allergies  Review of patient's allergies indicates no known allergies.  Home Medications   Prior to Admission medications   Medication Sig Start Date End Date Taking? Authorizing Provider  acyclovir (ZOVIRAX) 400 MG tablet Take 1 tablet (400 mg total) by mouth 2 (two) times daily. 02/21/15  Yes Curt Bears,  MD  amLODipine (NORVASC) 5 MG tablet Take 5 mg by mouth daily.   Yes Historical Provider, MD  bisacodyl (DULCOLAX) 5 MG EC tablet Take 1 tablet (5 mg total) by mouth daily as needed for moderate constipation. 02/27/15  Yes Curt Bears, MD  budesonide-formoterol [facility]) 80-4.5 MCG/ACT inhaler Inhale 2 puffs into the lungs 2 (two) times daily.   Yes Historical Provider, MD  calcium elemental as carbonate (TUMS ULTRA 1000) 400 MG chewable tablet Chew 2,000 mg by mouth 3 (three) times daily with meals. Using as a binder   Yes Historical Provider, MD  dexamethasone (DECADRON) 4 MG tablet Take 10 tablets (40 mg total) by mouth once a week. Patient taking differently: Take 40 mg by mouth once a week. On Fridays before lunch 03/02/15  Yes Curt Bears, MD  fluticasone [facility]) 50 MCG/ACT nasal spray Place 1 spray into both nostrils daily.   Yes Historical Provider, MD  gabapentin (NEURONTIN) 100 MG capsule Take 1 capsule (100 mg total) by mouth at bedtime. 05/03/15  Yes Donika Keith Rake, DO  meclizine (ANTIVERT) 25 MG tablet Take 1 tablet (25 mg total) by mouth 3 (three) times daily as needed for dizziness. 03/05/15  Yes Julianne Rice, MD  metoprolol succinate (TOPROL-XL) 25 MG 24 hr tablet Take 25 mg by mouth daily.   Yes Historical Provider, MD  omeprazole (PRILOSEC) 40 MG capsule Take 40 mg by mouth daily.   Yes Historical Provider, MD  oxyCODONE (OXY IR/ROXICODONE) 5 MG immediate release tablet Take 0.5 tablets (2.5 mg total) by mouth every 6 (six) hours as needed for severe pain. Take 1/2 tablet (2.5 mg) by mouth daily at bedtime, may take 1/2 tablet (2.5 mg) every 4 hours as needed for pain 06/12/15  Yes Alvia Grove, PA-C  traMADol (ULTRAM) 50 MG tablet Take 50-100 mg by mouth 3 (three) times daily as needed (pain).   Yes Historical Provider, MD   BP 180/88 mmHg  Pulse 113  Temp(Src) 98.2 F (36.8 C)  Resp 19  SpO2 90% Physical Exam Physical Exam  Nursing note and vitals  reviewed. Constitutional: elderly appearing man, chronically ill appearing, non-toxic, and in no acute distress Head: Normocephalic and atraumatic.  Mouth/Throat: Oropharynx is clear. Mucous membranes are dry.  Neck: Normal range of motion. Neck supple.  Cardiovascular: Tachycardic rate and regular rhythm.  No edema. Pulmonary/Chest: Effort normal and breath sounds normal.  Abdominal: Soft. There is no tenderness. There is no rebound and no guarding.  Musculoskeletal: Normal range of motion of all four extremities. No CTLS spine tenderness.  Neurological: Opens eyes to voice, moves all extremities symmetrically to command, facial droop, speech is mildly dysarthric and very slowed Skin: Skin  is warm and dry.  Psychiatric: Cooperative  ED Course  Procedures (including critical care time) Labs Review Labs Reviewed  BASIC METABOLIC PANEL - Abnormal; Notable for the following:    Chloride 98 (*)    Glucose, Bld 158 (*)    BUN 39 (*)    Creatinine, Ser 9.57 (*)    Calcium >15.0 (*)    GFR calc non Af Amer 4 (*)    GFR calc Af Amer 5 (*)    All other components within normal limits  CBC - Abnormal; Notable for the following:    WBC 17.2 (*)    RBC 2.55 (*)    Hemoglobin 9.3 (*)    HCT 26.9 (*)    MCV 105.5 (*)    MCH 36.5 (*)    Platelets 78 (*)    All other components within normal limits  CBG MONITORING, ED - Abnormal; Notable for the following:    Glucose-Capillary 162 (*)    All other components within normal limits  I-STAT CG4 LACTIC ACID, ED    Imaging Review Dg Chest 2 View  06/28/2015  CLINICAL DATA:  Altered mental status. 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 Since surgery patient is at increased confusion and weakness. Witnessed fall today. EXAM: CHEST  2 VIEW COMPARISON:  02/07/2015 FINDINGS: Right central venous dialysis catheter with lead tips over the cavoatrial junction and RA region. Shallow  inspiration with atelectasis in the lung bases. Normal heart size and pulmonary vascularity. No focal airspace disease or consolidation. No pneumothorax. No blunting of costophrenic angles. Degenerative changes in the spine. IMPRESSION: Shallow inspiration with atelectasis in the lung bases. No focal consolidation. Electronically Signed   By: Burman Nieves M.D.   On: 06/28/2015 18:22   Ct Head Wo Contrast  07/11/2015  CLINICAL DATA:  Altered mental status. EXAM: CT HEAD WITHOUT CONTRAST TECHNIQUE: Contiguous axial images were obtained from the base of the skull through the vertex without intravenous contrast. COMPARISON:  MRI brain 03/05/2015 FINDINGS: Ventricles, cisterns and other CSF spaces are within normal. There is no mass, mass effect, shift of midline structures or acute hemorrhage. Subtle chronic ischemic microvascular disease. Mild early basal ganglia calcification bilaterally. No evidence of acute infarction. Bilateral hearing aids. Remaining bones and soft tissues are within normal. IMPRESSION: No acute intracranial findings. Minimal chronic ischemic microvascular disease. Electronically Signed   By: Elberta Fortis M.D.   On: 07/05/2015 18:25   I have personally reviewed and evaluated these images and lab results as part of my medical decision-making.   EKG Interpretation None       CRITICAL CARE Performed by: Lavera Guise   Total critical care time: 35 minutes  Critical care time was exclusive of separately billable procedures and treating other patients.  Critical care was necessary to treat or prevent imminent or life-threatening deterioration.  Critical care was time spent personally by me on the following activities:  management acute encephalopathy in setting of hypercalcemia requiring dialysis, development of treatment plan with patient and/or surrogate as well as nursing, discussions with consultants, evaluation of patient's response to treatment, examination of patient,  obtaining history from patient or surrogate, ordering and performing treatments and interventions, ordering and review of laboratory studies, ordering and review of radiographic studies, pulse oximetry and re-evaluation of patient's condition.   MDM   Final diagnoses:  Hypercalcemia  Multiple myeloma not having achieved remission (HCC)  ESRD on dialysis [facility])  Encephalopathy  81 year old male with history of multiple myeloma and end-stage renal disease on hemodialysis who presents with acute encephalopathy and mechanical fall. Is tachycardic with a heart rate in the 110s to 120s on arrival. Afebrile and normotensive. Has slow mentation, and intermittently confused, but has no focal deficits. Remainder of exam is unremarkable. Although prior to coming to the emergency department he was complaining of some back pain, he says that this is all resolved now, and has no tenderness to palpation. No other acute injuries are noted on exam. Encephalopathy is likely secondary to hypercalcemia greater than 15. No other major electrolyte or metabolic derangements are noted. We'll plan on admission to the hospitalist service. Dr. Marval Regal has seen this patient in the emergency department with plans to dialyze this evening.     Forde Dandy, MD 07/09/2015 [id number]

## 2015-07-06 NOTE — Telephone Encounter (Signed)
[phone] Call received from [facility] reporting "Justin Woods fell backwards while coming down the steps.  He hollered when he hit the floor. He's had the two disc fused or cemented.  Does he need to come in or go to ED?  My son is a Agricultural consultant and we have him in the wheelchair.  We can't wait in the emergency room but we can take him if needed"   His bottom is the only body part that hit the floor.  Advised come to [facility] and if needs further evaluation, our staff can take him to the ED.

## 2015-07-06 NOTE — Telephone Encounter (Signed)
Justin Hailey, PA with [facility] called stating that on 07-04-15 [name]  Lab  From 07-04-15 at dialysis showed that his Calcium was 13.9. This maybe contributing to his altered mental status. If any questions , her cell number is [phone].

## 2015-07-06 NOTE — Consult Note (Signed)
[facility] Renal Consultation Note    Indication for Consultation:  Management of ESRD/hemodialysis; anemia, hypertension/volume and secondary hyperparathyroidism  HPI: Justin Woods is a 81 y.o. male.   Mr. Mcnay is an 81yo WM with PMH sig for HTN, emphysema, ESRD due to multiple myeloma who has been receiving weekly velcade and decadron and was on his way to receive another cycle when he fell on his sacrum.  He also has a history of a tumors in L2 and L4 and underwent radioablation and kyphoplasty on 06/22/15.  His family reports that he has had worsening confusion, fatigue, anorexia, and weakness over the last week.  He was on his way for another cycle of velcade and decadron at the [facility] when he fell backwards on his sacrum and was brought to [facility] ED for further evaluation.  He has also had a rising calcium level over the last 2 weeks and had a calcium of 13.9 on 07/04/15 at his HD center and was going to be evaluated at the cancer center for this as well.  While in the ED, his CT scan and CXR were unremarkable and he did not have spinal or sacral pain on exam.  However his Calcium level was >15, hgb 9.3, and platelets of 78,000.  We were asked to help manage his hemodialysis as well as his other ESRD-related disease processes and to help manage his hypercalcemia.    Past Medical History  Diagnosis Date  . Hypertension   . COPD (chronic obstructive pulmonary disease) (HCC)   . Asthma   . History of kidney stones   . History of hiatal hernia   . Headache     HISTORY OF CLUSTER HEADACHES  . Arthritis   . Complication of anesthesia     TROUBLE WAKING UP  . H/O colonoscopy with polypectomy   . Allergic rhinitis   . DVT (deep venous thrombosis) (HCC)     RIGHT CALF AFTER A LITHOTRIPSY  . BPH (benign prostatic hypertrophy)   . Osteoarthritis   . Emphysema of lung (HCC)   . Chronic kidney disease     dialysis - M/W/F  . Anemia   . Cancer (HCC)     multiple  myeloma  . Constipation   . Back pain     2 compressed disc diagnosed 10/29 after MRI   Past Surgical History  Procedure Laterality Date  . Lithotripsy    . Back surgery    . Arthroscopy right knee    . Cholecystectomy    . Skin cancer removed from ears    . Insertion of dialysis catheter Right 02/07/2015    Procedure: INSERTION OF DIALYSIS CATHETER;  Surgeon: Larina Earthly, MD;  Location: [facility] OR;  Service: Vascular;  Laterality: Right;  . Av fistula placement Left 02/07/2015    Procedure: ARTERIOVENOUS (AV) FISTULA CREATION;  Surgeon: Larina Earthly, MD;  Location: [facility] OR;  Service: Vascular;  Laterality: Left;  . Removal of a dialysis catheter Left 02/07/2015    Procedure: Removal of  Temporary catheter in left neck;  Surgeon: Larina Earthly, MD;  Location: [facility] OR;  Service: Vascular;  Laterality: Left;  . Back surgery      1969  . Colonoscopy    . Ligation of competing branches of arteriovenous fistula Left 06/12/2015    Procedure: LIGATION OF COMPETING BRANCHES OF Left Arm ARTERIOVENOUS FISTULA;  Surgeon: Fransisco Hertz, MD;  Location: [facility] OR;  Service: Vascular;  Laterality: Left;  Family History:   Family History  Problem Relation Age of Onset  . Hypertension Mother   . Hypertension Father   . Cancer Brother   . Cancer Brother   . Healthy Son    Social History:  reports that he quit smoking about 20 years ago. His smoking use included Cigarettes. He smoked 2.00 packs per day. He has never used smokeless tobacco. He reports that he does not drink alcohol or use illicit drugs. No Known Allergies Prior to Admission medications   Medication Sig Start Date End Date Taking? Authorizing Provider  acyclovir (ZOVIRAX) 400 MG tablet Take 1 tablet (400 mg total) by mouth 2 (two) times daily. 02/21/15  Yes Curt Bears, MD  amLODipine (NORVASC) 5 MG tablet Take 5 mg by mouth daily.   Yes Historical Provider, MD  bisacodyl (DULCOLAX) 5 MG EC tablet Take 1 tablet (5 mg total) by mouth daily as  needed for moderate constipation. 02/27/15  Yes Curt Bears, MD  budesonide-formoterol [facility]) 80-4.5 MCG/ACT inhaler Inhale 2 puffs into the lungs 2 (two) times daily.   Yes Historical Provider, MD  calcium elemental as carbonate (TUMS ULTRA 1000) 400 MG chewable tablet Chew 2,000 mg by mouth 3 (three) times daily with meals. Using as a binder   Yes Historical Provider, MD  dexamethasone (DECADRON) 4 MG tablet Take 10 tablets (40 mg total) by mouth once a week. Patient taking differently: Take 40 mg by mouth once a week. On Fridays before lunch 03/02/15  Yes Curt Bears, MD  fluticasone [facility]) 50 MCG/ACT nasal spray Place 1 spray into both nostrils daily.   Yes Historical Provider, MD  gabapentin (NEURONTIN) 100 MG capsule Take 1 capsule (100 mg total) by mouth at bedtime. 05/03/15  Yes Donika Keith Rake, DO  meclizine (ANTIVERT) 25 MG tablet Take 1 tablet (25 mg total) by mouth 3 (three) times daily as needed for dizziness. 03/05/15  Yes Julianne Rice, MD  metoprolol succinate (TOPROL-XL) 25 MG 24 hr tablet Take 25 mg by mouth daily.   Yes Historical Provider, MD  omeprazole (PRILOSEC) 40 MG capsule Take 40 mg by mouth daily.   Yes Historical Provider, MD  oxyCODONE (OXY IR/ROXICODONE) 5 MG immediate release tablet Take 0.5 tablets (2.5 mg total) by mouth every 6 (six) hours as needed for severe pain. Take 1/2 tablet (2.5 mg) by mouth daily at bedtime, may take 1/2 tablet (2.5 mg) every 4 hours as needed for pain 06/12/15  Yes Alvia Grove, PA-C  traMADol (ULTRAM) 50 MG tablet Take 50-100 mg by mouth 3 (three) times daily as needed (pain).   Yes Historical Provider, MD   No current facility-administered medications for this encounter.   Current Outpatient Prescriptions  Medication Sig Dispense Refill  . acyclovir (ZOVIRAX) 400 MG tablet Take 1 tablet (400 mg total) by mouth 2 (two) times daily. 60 tablet 3  . amLODipine (NORVASC) 5 MG tablet Take 5 mg by mouth daily.    . bisacodyl  (DULCOLAX) 5 MG EC tablet Take 1 tablet (5 mg total) by mouth daily as needed for moderate constipation. 30 tablet 0  . budesonide-formoterol (SYMBICORT) 80-4.5 MCG/ACT inhaler Inhale 2 puffs into the lungs 2 (two) times daily.    . calcium elemental as carbonate (TUMS ULTRA 1000) 400 MG chewable tablet Chew 2,000 mg by mouth 3 (three) times daily with meals. Using as a binder    . dexamethasone (DECADRON) 4 MG tablet Take 10 tablets (40 mg total) by mouth once a week. (  Patient taking differently: Take 40 mg by mouth once a week. On Fridays before lunch) 40 tablet 4  . fluticasone (FLONASE) 50 MCG/ACT nasal spray Place 1 spray into both nostrils daily.    Marland Kitchen gabapentin (NEURONTIN) 100 MG capsule Take 1 capsule (100 mg total) by mouth at bedtime. 30 capsule 5  . meclizine (ANTIVERT) 25 MG tablet Take 1 tablet (25 mg total) by mouth 3 (three) times daily as needed for dizziness. 30 tablet 0  . metoprolol succinate (TOPROL-XL) 25 MG 24 hr tablet Take 25 mg by mouth daily.    Marland Kitchen omeprazole (PRILOSEC) 40 MG capsule Take 40 mg by mouth daily.    Marland Kitchen oxyCODONE (OXY IR/ROXICODONE) 5 MG immediate release tablet Take 0.5 tablets (2.5 mg total) by mouth every 6 (six) hours as needed for severe pain. Take 1/2 tablet (2.5 mg) by mouth daily at bedtime, may take 1/2 tablet (2.5 mg) every 4 hours as needed for pain 20 tablet 0  . traMADol (ULTRAM) 50 MG tablet Take 50-100 mg by mouth 3 (three) times daily as needed (pain).     Labs: Basic Metabolic Panel:  Recent Labs Lab 06/23/2015 1530  NA 136  K 5.1  CL 98*  CO2 25  GLUCOSE 158*  BUN 39*  CREATININE 9.57*  CALCIUM >15.0*   Liver Function Tests: No results for input(s): AST, ALT, ALKPHOS, BILITOT, PROT, ALBUMIN in the last 168 hours. No results for input(s): LIPASE, AMYLASE in the last 168 hours. No results for input(s): AMMONIA in the last 168 hours. CBC:  Recent Labs Lab 06/20/2015 1530  WBC 17.2*  HGB 9.3*  HCT 26.9*  MCV 105.5*  PLT 78*    Cardiac Enzymes: No results for input(s): CKTOTAL, CKMB, CKMBINDEX, TROPONINI in the last 168 hours. CBG:  Recent Labs Lab 06/28/2015 1738  GLUCAP 162*   Iron Studies: No results for input(s): IRON, TIBC, TRANSFERRIN, FERRITIN in the last 72 hours. Studies/Results: Dg Chest 2 View  07/11/2015  CLINICAL DATA:  Altered mental status. 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 Since surgery patient is at increased confusion and weakness. Witnessed fall today. EXAM: CHEST  2 VIEW COMPARISON:  02/07/2015 FINDINGS: Right central venous dialysis catheter with lead tips over the cavoatrial junction and RA region. Shallow inspiration with atelectasis in the lung bases. Normal heart size and pulmonary vascularity. No focal airspace disease or consolidation. No pneumothorax. No blunting of costophrenic angles. Degenerative changes in the spine. IMPRESSION: Shallow inspiration with atelectasis in the lung bases. No focal consolidation. Electronically Signed   By: Burman Nieves M.D.   On: 06/22/2015 18:22   Ct Head Wo Contrast  06/17/2015  CLINICAL DATA:  Altered mental status. EXAM: CT HEAD WITHOUT CONTRAST TECHNIQUE: Contiguous axial images were obtained from the base of the skull through the vertex without intravenous contrast. COMPARISON:  MRI brain 03/05/2015 FINDINGS: Ventricles, cisterns and other CSF spaces are within normal. There is no mass, mass effect, shift of midline structures or acute hemorrhage. Subtle chronic ischemic microvascular disease. Mild early basal ganglia calcification bilaterally. No evidence of acute infarction. Bilateral hearing aids. Remaining bones and soft tissues are within normal. IMPRESSION: No acute intracranial findings. Minimal chronic ischemic microvascular disease. Electronically Signed   By: Elberta Fortis M.D.   On: 06/14/2015 18:25    ROS: Review of systems not obtained due to patient  factors. Physical Exam: Filed Vitals:   06/19/2015 1459 06/23/2015 1736  BP: 177/106 180/88  Pulse: 124 113  Temp: 98.2 F (36.8 C)   Resp: 18 19  SpO2: 91% 90%      Weight change:  No intake or output data in the 24 hours ending 06/24/2015 1926 BP 180/88 mmHg  Pulse 113  Temp(Src) 98.2 F (36.8 C)  Resp 19  SpO2 90% General appearance: no distress, pale and lethargic but arousable Head: Normocephalic, without obvious abnormality, atraumatic Eyes: negative findings: lids and lashes normal, conjunctivae and sclerae normal and corneas clear Resp: clear to auscultation bilaterally Cardio: regular rate and rhythm and no rub GI: soft, non-tender; bowel sounds normal; no masses,  no organomegaly Extremities: edema minimal pretibial edema and L forearm AVF +T/B, small diameter Dialysis Access: R IJ HD catheter and L cimino AVF +T/B  Dialysis Orders: Center: [facility]  on TTS . EDW 78kg HD Bath 2K/2.25Ca  Time 4 hours Heparin 2400. Access RIJ TDC and maturing L cimino AVF BFR 400 DFR 800    Micera 262mcg every 2 weeks  Assessment/Plan: 1.  Hypercalcemia - presumably due to multiple myeloma but possibly complicated by vitamin D therapy and calcium carbonate 1. Plan for HD tonight with low calcium bath and follow serum calcium levels 2. Will also dose calcitonin to help lower calcium levels 3. Await Heme input for other therapies related to myeloma 2.  ESRD -  Due to myeloma cast nephropathy.  Plan for HD as above 3.  Hypertension/volume  - high likely related to hypercalcemia, follow after HD 4.  Anemia  - on ESA 5.  Metabolic bone disease -  Stopped vitamin D earlier this week and will stop calcium carbonate for now and follow 6.  Nutrition - per primary svc 7. Vascular Access - has RIJ TDC and maturing L Cimino AVf 8. AMS- presumably due to hypercalcemia follow clinically, CT scan negative 9. Multiple Myeloma - missed today's cycle of velcade and decadron.  Await Heme input for other  possible therapies.  Donetta Potts, MD [place] Pager [phone] 07/04/2015, 7:26 PM

## 2015-07-06 NOTE — Telephone Encounter (Signed)
This provider received phone call from [facility] front lobby at 1450- informing me that pt's wife checked in to front desk and then stated that she was taking pt to the ED for evaluation.   Had just received paperwork from Nephorologist regarding labs drawn on 07/04/2015 which revealed hypercalcemia at 13.9.  From triage note received approximately 1 hour ago-patient's wife called back stating that patient had just fallen backwards landing on his sacral region.  She denied patient hitting his head.  Apparently patient just recently underwent back surgery as well.  This provider called the emergency department charge nurse to inform of hypercalcemia paperwork just received this afternoon.  Emergency department charge nurse informed this provider that there would be a significant wait time while in the emergency department, unfortunately.  Advised charge nurse to call this provider for any further questions.  We will also alert the on call physician today.  The cancer center.

## 2015-07-06 NOTE — Telephone Encounter (Addendum)
Patient's family called reporting "in ED and we'll be here for a while."  Advised we can cancel today's appointments.  [facility] is aware of patient going to ED and the wait time.

## 2015-07-06 NOTE — ED Notes (Signed)
EKG given to EDP,Plunkett,MD., for review. 

## 2015-07-06 NOTE — ED Notes (Signed)
Family members states patient has declined in the past week.

## 2015-07-06 NOTE — ED Notes (Signed)
Call placed to [facility] 

## 2015-07-06 NOTE — ED Notes (Signed)
[facility] to arrive in 20 minutes

## 2015-07-06 NOTE — ED Notes (Signed)
Per Pt, recent cement to L1?Marland Kitchen  Pt fell going down steps to get to chemo treatment. Has had increased weakness x 1 week.  Pt has hx multiple myeloma.  Oriented to baseline.

## 2015-07-06 NOTE — Telephone Encounter (Signed)
Patient's wife called asking if Justin Woods can be seen today.  "His mobility is nit good.  Has trouble walking and not doing well.  He doesn't know what's going on sometimes.  Today lab at 2:30/ velcade at 3:15 pm.  He initially walked in for visits, progressed to wheelchair and now I'm going to have to get my son or someone to help me get him in today.  I'm having trouble managing him.  Back pain improved after the vertebroplasty but he does still C/O of back pain.  I give him tylenol most of the time or half oxycodone 5 mg tablet."  Will add visit for [facility] to see patient in the infusion room today at 3:00.

## 2015-07-06 NOTE — H&P (Addendum)
[facility] History and Physical  NEEV MCMAINS MWU:[id number] DOB: 1930-10-29 DOA: 06/19/2015  Referring physician: ED PCP: Wenda Low, MD   Chief Complaint: Fall  HPI:  Mr. produces 81 year old male with a past medical history of multiple myeloma diagnosed in 6/16, COPD, hypertension, end-stage renal disease on hemodialysis; who presents after having a fall at home on his way to the oncology office. Patient walks with a rolling walker and had been coming out of house where he lost balance and fell backwards onto his bottom. He notes no loss of consciousness and do not feel like he hit his head. Family states patient had been progressively declining over last weekend after 2 weeks. Stating that he was intermittently confused and altered. During Thanksgiving family noted that he had been eating very slowly and has been losing weight ever since diagnosed with multiple myeloma. He follows with Dr. Earlie Server and had been receiving infusions of Velcade. Upon arrival to the emergency department the patient was found to have a elevated calcium level of greater than 15. Nephrology was immediately consulted and I suggested light IV hydration with transferred to [facility] for hemodialysis.    Review of Systems  Constitutional: Positive for malaise/fatigue. Negative for chills.  HENT: Positive for hearing loss. Negative for congestion.   Eyes: Negative for double vision and pain.  Cardiovascular: Positive for palpitations and leg swelling. Negative for chest pain.  Gastrointestinal: Negative for abdominal pain and diarrhea.  Genitourinary: Negative for dysuria and urgency.  Musculoskeletal: Positive for back pain and falls.  Skin: Negative for itching and rash.  Neurological: Positive for tremors and weakness. Negative for focal weakness.  Psychiatric/Behavioral: Negative for suicidal ideas and substance abuse.       Positive for confusion/ altered mental status      Past Medical  History  Diagnosis Date  . Hypertension   . COPD (chronic obstructive pulmonary disease) ([place])   . Asthma   . History of kidney stones   . History of hiatal hernia   . Headache     HISTORY OF CLUSTER HEADACHES  . Arthritis   . Complication of anesthesia     TROUBLE WAKING UP  . H/O colonoscopy with polypectomy   . Allergic rhinitis   . DVT (deep venous thrombosis) ([facility])     RIGHT CALF AFTER A LITHOTRIPSY  . BPH (benign prostatic hypertrophy)   . Osteoarthritis   . Emphysema of lung ([place])   . Chronic kidney disease     dialysis - M/W/F  . Anemia   . Cancer ([place])     multiple myeloma  . Constipation   . Back pain     2 compressed disc diagnosed 10/29 after MRI     Past Surgical History  Procedure Laterality Date  . Lithotripsy    . Back surgery    . Arthroscopy right knee    . Cholecystectomy    . Skin cancer removed from ears    . Insertion of dialysis catheter Right 02/07/2015    Procedure: INSERTION OF DIALYSIS CATHETER;  Surgeon: Rosetta Posner, MD;  Location: [place];  Service: Vascular;  Laterality: Right;  . Av fistula placement Left 02/07/2015    Procedure: ARTERIOVENOUS (AV) FISTULA CREATION;  Surgeon: Rosetta Posner, MD;  Location: [place];  Service: Vascular;  Laterality: Left;  . Removal of a dialysis catheter Left 02/07/2015    Procedure: Removal of  Temporary catheter in left neck;  Surgeon: Rosetta Posner, MD;  Location: [place];  Service: Vascular;  Laterality: Left;  . Back surgery      1969  . Colonoscopy    . Ligation of competing branches of arteriovenous fistula Left 06/12/2015    Procedure: LIGATION OF COMPETING BRANCHES OF Left Arm ARTERIOVENOUS FISTULA;  Surgeon: Conrad [place], MD;  Location: [place];  Service: Vascular;  Laterality: Left;      Social History:  reports that he quit smoking about 20 years ago. His smoking use included Cigarettes. He smoked 2.00 packs per day. He has never used smokeless tobacco. He reports that he does not drink alcohol or use  illicit drugs. Where does patient live--home  and with whom if at home? Wife Can patient participate in ADLs? Yes  No Known Allergies  Family History  Problem Relation Age of Onset  . Hypertension Mother   . Hypertension Father   . Cancer Brother   . Cancer Brother   . Healthy Son       Prior to Admission medications   Medication Sig Start Date End Date Taking? Authorizing Provider  acyclovir (ZOVIRAX) 400 MG tablet Take 1 tablet (400 mg total) by mouth 2 (two) times daily. 02/21/15  Yes Curt Bears, MD  amLODipine (NORVASC) 5 MG tablet Take 5 mg by mouth daily.   Yes Historical Provider, MD  bisacodyl (DULCOLAX) 5 MG EC tablet Take 1 tablet (5 mg total) by mouth daily as needed for moderate constipation. 02/27/15  Yes Curt Bears, MD  budesonide-formoterol [facility]) 80-4.5 MCG/ACT inhaler Inhale 2 puffs into the lungs 2 (two) times daily.   Yes Historical Provider, MD  calcium elemental as carbonate (TUMS ULTRA 1000) 400 MG chewable tablet Chew 2,000 mg by mouth 3 (three) times daily with meals. Using as a binder   Yes Historical Provider, MD  dexamethasone (DECADRON) 4 MG tablet Take 10 tablets (40 mg total) by mouth once a week. Patient taking differently: Take 40 mg by mouth once a week. On Fridays before lunch 03/02/15  Yes Curt Bears, MD  fluticasone [facility]) 50 MCG/ACT nasal spray Place 1 spray into both nostrils daily.   Yes Historical Provider, MD  gabapentin (NEURONTIN) 100 MG capsule Take 1 capsule (100 mg total) by mouth at bedtime. 05/03/15  Yes Donika Keith Rake, DO  meclizine (ANTIVERT) 25 MG tablet Take 1 tablet (25 mg total) by mouth 3 (three) times daily as needed for dizziness. 03/05/15  Yes Julianne Rice, MD  metoprolol succinate (TOPROL-XL) 25 MG 24 hr tablet Take 25 mg by mouth daily.   Yes Historical Provider, MD  omeprazole (PRILOSEC) 40 MG capsule Take 40 mg by mouth daily.   Yes Historical Provider, MD  oxyCODONE (OXY IR/ROXICODONE) 5 MG immediate  release tablet Take 0.5 tablets (2.5 mg total) by mouth every 6 (six) hours as needed for severe pain. Take 1/2 tablet (2.5 mg) by mouth daily at bedtime, may take 1/2 tablet (2.5 mg) every 4 hours as needed for pain 06/12/15  Yes Alvia Grove, PA-C  traMADol (ULTRAM) 50 MG tablet Take 50-100 mg by mouth 3 (three) times daily as needed (pain).   Yes Historical Provider, MD     Physical Exam: Filed Vitals:   06/18/2015 1459 07/11/2015 1736 07/01/2015 1927  BP: 177/106 180/88 186/104  Pulse: 124 113 124  Temp: 98.2 F (36.8 C)  98.3 F (36.8 C)  TempSrc:   Oral  Resp: $Remo'18 19 25  'dKbnJ$ SpO2: 91% 90% 94%     Constitutional: Vital signs reviewed. Patient is well-nourished elderly  male who is chronically ill-appearing and lethargic, but  arousable and not toxic.  Head: Normocephalic and atraumatic  Ear: TM normal bilaterally  Mouth: no erythema or exudates, MMM  Eyes: PERRL, EOMI, conjunctivae normal, No scleral icterus.  Neck: Supple, Trachea midline normal ROM, No JVD, mass, thyromegaly, or carotid bruit present.  Cardiovascular: Tachycardic, pulses symmetric and intact bilaterally  Pulmonary/Chest: CTAB, no wheezes, rales, or rhonchi  Abdominal: Soft. Non-tender, non-distended, bowel sounds are normal, no masses, organomegaly, or guarding present.  GU: no CVA tenderness Musculoskeletal: No joint deformities, erythema, or stiffness, ROM full and no nontender Ext: +1 pitting edema bilateral lower extremities. No cyanosis, pulses palpable bilaterally (DP and PT)  Hematology: no cervical, inginal, or axillary adenopathy. AV Fistula of left upper extremity  Neurological: A&O x3, Strenght is normal and symmetric bilaterally, cranial nerve II-XII are grossly intact, no focal motor deficit, sensory intact to light touch bilaterally. Intermittent twitches noted   Skin: Warm, dry and intact. No rash, cyanosis, or clubbing.  Psychiatric: lethargic with mild confusion and slowed speech     Data Review    Micro Results Recent Results (from the past 240 hour(s))  TECHNOLOGIST REVIEW     Status: None   Collection Time: 06/29/15  2:26 PM  Result Value Ref Range Status   Technologist Review Occas. Meta and Myelo  Final    Radiology Reports Dg Chest 2 View  06/21/2015  CLINICAL DATA:  Altered mental status. 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 Since surgery patient is at increased confusion and weakness. Witnessed fall today. EXAM: CHEST  2 VIEW COMPARISON:  02/07/2015 FINDINGS: Right central venous dialysis catheter with lead tips over the cavoatrial junction and RA region. Shallow inspiration with atelectasis in the lung bases. Normal heart size and pulmonary vascularity. No focal airspace disease or consolidation. No pneumothorax. No blunting of costophrenic angles. Degenerative changes in the spine. IMPRESSION: Shallow inspiration with atelectasis in the lung bases. No focal consolidation. Electronically Signed   By: Lucienne Capers M.D.   On: 06/19/2015 18:22   Ct Head Wo Contrast  07/09/2015  CLINICAL DATA:  Altered mental status. EXAM: CT HEAD WITHOUT CONTRAST TECHNIQUE: Contiguous axial images were obtained from the base of the skull through the vertex without intravenous contrast. COMPARISON:  MRI brain 03/05/2015 FINDINGS: Ventricles, cisterns and other CSF spaces are within normal. There is no mass, mass effect, shift of midline structures or acute hemorrhage. Subtle chronic ischemic microvascular disease. Mild early basal ganglia calcification bilaterally. No evidence of acute infarction. Bilateral hearing aids. Remaining bones and soft tissues are within normal. IMPRESSION: No acute intracranial findings. Minimal chronic ischemic microvascular disease. Electronically Signed   By: Marin Olp M.D.   On: 06/29/2015 18:25   Mr Lumbar Spine Wo Contrast  06/10/2015  CLINICAL DATA:  Low back pain with compression fractures  on radiographs. Multiple myeloma and renal failure. EXAM: MRI LUMBAR SPINE WITHOUT CONTRAST TECHNIQUE: Multiplanar, multisequence MR imaging of the lumbar spine was performed. No intravenous contrast was administered. COMPARISON:  Radiographs 05/29/2015 abdominal CT 01/30/2015 and lumbar MRI 07/27/2012. FINDINGS: There are 5 lumbar type vertebral bodies demonstrating similar alignment with a mild convex right scoliosis. The patient has widespread marrow abnormalities consistent with diffuse myelomatous involvement. Lesions are demonstrated throughout the vertebral bodies, their posterior elements and upper pelvis. There is a 2.3 cm lesion in the right aspect of the sacrum. There is a large partially imaged lesion in the right iliac  bone, measuring up to 3.6 cm on axial image 38. Chronic L4 compression deformity appears unchanged. There is a new biconcave fracture at L2 which is likely pathologic and associated with mild marrow edema. There is no osseous retropulsion or associated epidural tumor. No other fractures are identified. L1 appears intact. The conus medullaris extends to the L1-2 level and appears normal. No paraspinal abnormalities are identified.There are left renal cysts. There are no significant disc space findings at T11-12, T12-L1 or L1-2. L2-3: Disc bulging and facet hypertrophy contribute to borderline spinal stenosis. There is no nerve root encroachment. L3-4: Annular disc bulging, facet and ligamentous hypertrophy contribute to mild spinal stenosis, similar to the prior examination. No foraminal compromise. L4-5: Remote postsurgical changes are present on right. Progressive loss of disc height with probable ankylosis on the left. There is annular disc bulging with paraspinal osteophytes asymmetric to the left. Moderate facet and ligamentous hypertrophy is present. These factors contribute to stable mild spinal stenosis and mild narrowing of the lateral recesses and foramina bilaterally. L5-S1:  Annular disc bulging paraspinal osteophytes are asymmetric to the right. Facet hypertrophy is also worse on the right. These factors contribute to mild narrowing of the lateral recesses and foramina, right greater than left. IMPRESSION: 1. Widespread marrow abnormalities throughout the spine and pelvis consistent with multiple myeloma. 2. New biconcave compression fracture at L2, likely pathologic. No osseous retropulsion or epidural tumor demonstrated. No other acute osseous findings. 3. Multilevel spondylosis appears only mildly progressive from previous MRI. No high-grade spinal stenosis or definite nerve root encroachment. Electronically Signed   By: Richardean Sale M.D.   On: 06/10/2015 12:38   Ir Bone Tumor(s)rf Ablation  06/22/2015  CLINICAL DATA:  Patient with severely painful pathological compression fracture at L2 and L4. History of multiple myeloma. EXAM: IR VERTEBROPLASTY LUMBOSACRAL INJ L4 AND L2 RADIOFREQUENCY TUMOR ABLATION FOLLOWED BY VERTEBRAL BODY AUGMENTATION USING THE VERTEBROPLASTY TECHNIQUE: MEDICATIONS: Versed 4 mg IV, Fentanyl 100 mcg IV.  2 mg of Dilaudid IV. ANESTHESIA/SEDATION: Total Moderate Sedation Time:  75 minutes. FLUOROSCOPY TIME:  22 minutes 36 seconds. PROCEDURE: Following a full explanation of the procedure along with the potential associated complications, an informed witnessed consent was obtained. The patient was placed prone on the fluoroscopic table. Nasal oxygen was administered. Physiologic monitoring was performed throughout the duration of the procedure. The skin overlying the lump region was prepped and draped in the usual sterile fashion. The L2 and L4 vertebral bodies were identified and the right/left pedicles at both L2 and L4 were then infiltrated with 0.25% bupivacaine. This was then followed by the advancement under biplane intermittent fluoroscopy of an 8 gauge Kyphon needle into the posterior 1/3 just anterior to the pedicles. Depth measurements for the  probes was then undertaken. It was decided to use a 20 cm probes for radiofrequency ablation. These were then inserted under biplane intermittent fluoroscopy at both L2 and L4. Radiofrequency ablation was then undertaken over 15 minutes using the OsteoCool radiofrequency ablation system. At the end of this, the needles at both levels through both pedicles were then advanced to the junction of the anterior and middle one thirds. Methylmethacrylate mixture was then reconstituted with tobramycin in the Kyphon mixing system. This was then loaded onto the Kyphon injecting needles. Using biplane intermittent fluoroscopy, five bone filler equivalents were then injected at L2, and five at L4. Excellent filling was obtained in the AP and lateral projections. There was no extravasation seen into the adjacent disc spaces, or posteriorly into  the spinal canal or into the paraspinous venous structures. The needles were then retrieved and removed. Hemostasis was achieved at the skin entry sites. The patient tolerated the procedure well. There were no acute complications. IMPRESSION: Status post L2 and L4 radiofrequency tumor ablation for pathologic compression fracture due to multiple myeloma at L2 and L4 followed by vertebral body augmentation using the vertebroplasty technique. Electronically Signed   By: Luanne Bras M.D.   On: 06/21/2015 13:13   Ir Bone Tumor(s)rf Ablation  06/22/2015  CLINICAL DATA:  Patient with severely painful pathological compression fracture at L2 and L4. History of multiple myeloma. EXAM: IR VERTEBROPLASTY LUMBOSACRAL INJ L4 AND L2 RADIOFREQUENCY TUMOR ABLATION FOLLOWED BY VERTEBRAL BODY AUGMENTATION USING THE VERTEBROPLASTY TECHNIQUE: MEDICATIONS: Versed 4 mg IV, Fentanyl 100 mcg IV.  2 mg of Dilaudid IV. ANESTHESIA/SEDATION: Total Moderate Sedation Time:  75 minutes. FLUOROSCOPY TIME:  22 minutes 36 seconds. PROCEDURE: Following a full explanation of the procedure along with the potential  associated complications, an informed witnessed consent was obtained. The patient was placed prone on the fluoroscopic table. Nasal oxygen was administered. Physiologic monitoring was performed throughout the duration of the procedure. The skin overlying the lump region was prepped and draped in the usual sterile fashion. The L2 and L4 vertebral bodies were identified and the right/left pedicles at both L2 and L4 were then infiltrated with 0.25% bupivacaine. This was then followed by the advancement under biplane intermittent fluoroscopy of an 8 gauge Kyphon needle into the posterior 1/3 just anterior to the pedicles. Depth measurements for the probes was then undertaken. It was decided to use a 20 cm probes for radiofrequency ablation. These were then inserted under biplane intermittent fluoroscopy at both L2 and L4. Radiofrequency ablation was then undertaken over 15 minutes using the OsteoCool radiofrequency ablation system. At the end of this, the needles at both levels through both pedicles were then advanced to the junction of the anterior and middle one thirds. Methylmethacrylate mixture was then reconstituted with tobramycin in the Kyphon mixing system. This was then loaded onto the Kyphon injecting needles. Using biplane intermittent fluoroscopy, five bone filler equivalents were then injected at L2, and five at L4. Excellent filling was obtained in the AP and lateral projections. There was no extravasation seen into the adjacent disc spaces, or posteriorly into the spinal canal or into the paraspinous venous structures. The needles were then retrieved and removed. Hemostasis was achieved at the skin entry sites. The patient tolerated the procedure well. There were no acute complications. IMPRESSION: Status post L2 and L4 radiofrequency tumor ablation for pathologic compression fracture due to multiple myeloma at L2 and L4 followed by vertebral body augmentation using the vertebroplasty technique.  Electronically Signed   By: Luanne Bras M.D.   On: 06/21/2015 13:13   Ir Vertebroplasty Lumobsacral Inj  06/22/2015  CLINICAL DATA:  Patient with severely painful pathological compression fracture at L2 and L4. History of multiple myeloma. EXAM: IR VERTEBROPLASTY LUMBOSACRAL INJ L4 AND L2 RADIOFREQUENCY TUMOR ABLATION FOLLOWED BY VERTEBRAL BODY AUGMENTATION USING THE VERTEBROPLASTY TECHNIQUE: MEDICATIONS: Versed 4 mg IV, Fentanyl 100 mcg IV.  2 mg of Dilaudid IV. ANESTHESIA/SEDATION: Total Moderate Sedation Time:  75 minutes. FLUOROSCOPY TIME:  22 minutes 36 seconds. PROCEDURE: Following a full explanation of the procedure along with the potential associated complications, an informed witnessed consent was obtained. The patient was placed prone on the fluoroscopic table. Nasal oxygen was administered. Physiologic monitoring was performed throughout the duration of the procedure. The skin  overlying the lump region was prepped and draped in the usual sterile fashion. The L2 and L4 vertebral bodies were identified and the right/left pedicles at both L2 and L4 were then infiltrated with 0.25% bupivacaine. This was then followed by the advancement under biplane intermittent fluoroscopy of an 8 gauge Kyphon needle into the posterior 1/3 just anterior to the pedicles. Depth measurements for the probes was then undertaken. It was decided to use a 20 cm probes for radiofrequency ablation. These were then inserted under biplane intermittent fluoroscopy at both L2 and L4. Radiofrequency ablation was then undertaken over 15 minutes using the OsteoCool radiofrequency ablation system. At the end of this, the needles at both levels through both pedicles were then advanced to the junction of the anterior and middle one thirds. Methylmethacrylate mixture was then reconstituted with tobramycin in the Kyphon mixing system. This was then loaded onto the Kyphon injecting needles. Using biplane intermittent fluoroscopy, five  bone filler equivalents were then injected at L2, and five at L4. Excellent filling was obtained in the AP and lateral projections. There was no extravasation seen into the adjacent disc spaces, or posteriorly into the spinal canal or into the paraspinous venous structures. The needles were then retrieved and removed. Hemostasis was achieved at the skin entry sites. The patient tolerated the procedure well. There were no acute complications. IMPRESSION: Status post L2 and L4 radiofrequency tumor ablation for pathologic compression fracture due to multiple myeloma at L2 and L4 followed by vertebral body augmentation using the vertebroplasty technique. Electronically Signed   By: Luanne Bras M.D.   On: 06/21/2015 13:13   Ir Vertebroplasty Add'l Injection  06/22/2015  CLINICAL DATA:  Patient with severely painful pathological compression fracture at L2 and L4. History of multiple myeloma. EXAM: IR VERTEBROPLASTY LUMBOSACRAL INJ L4 AND L2 RADIOFREQUENCY TUMOR ABLATION FOLLOWED BY VERTEBRAL BODY AUGMENTATION USING THE VERTEBROPLASTY TECHNIQUE: MEDICATIONS: Versed 4 mg IV, Fentanyl 100 mcg IV.  2 mg of Dilaudid IV. ANESTHESIA/SEDATION: Total Moderate Sedation Time:  75 minutes. FLUOROSCOPY TIME:  22 minutes 36 seconds. PROCEDURE: Following a full explanation of the procedure along with the potential associated complications, an informed witnessed consent was obtained. The patient was placed prone on the fluoroscopic table. Nasal oxygen was administered. Physiologic monitoring was performed throughout the duration of the procedure. The skin overlying the lump region was prepped and draped in the usual sterile fashion. The L2 and L4 vertebral bodies were identified and the right/left pedicles at both L2 and L4 were then infiltrated with 0.25% bupivacaine. This was then followed by the advancement under biplane intermittent fluoroscopy of an 8 gauge Kyphon needle into the posterior 1/3 just anterior to the  pedicles. Depth measurements for the probes was then undertaken. It was decided to use a 20 cm probes for radiofrequency ablation. These were then inserted under biplane intermittent fluoroscopy at both L2 and L4. Radiofrequency ablation was then undertaken over 15 minutes using the OsteoCool radiofrequency ablation system. At the end of this, the needles at both levels through both pedicles were then advanced to the junction of the anterior and middle one thirds. Methylmethacrylate mixture was then reconstituted with tobramycin in the Kyphon mixing system. This was then loaded onto the Kyphon injecting needles. Using biplane intermittent fluoroscopy, five bone filler equivalents were then injected at L2, and five at L4. Excellent filling was obtained in the AP and lateral projections. There was no extravasation seen into the adjacent disc spaces, or posteriorly into the spinal canal or into  the paraspinous venous structures. The needles were then retrieved and removed. Hemostasis was achieved at the skin entry sites. The patient tolerated the procedure well. There were no acute complications. IMPRESSION: Status post L2 and L4 radiofrequency tumor ablation for pathologic compression fracture due to multiple myeloma at L2 and L4 followed by vertebral body augmentation using the vertebroplasty technique. Electronically Signed   By: Luanne Bras M.D.   On: 06/21/2015 13:13   Ir Radiologist Eval & Mgmt  06/18/2015  EXAM: NEW PATIENT OFFICE VISIT CHIEF COMPLAINT: Severe low back pain secondary to compression fracture at L2. Current Pain Level: 1-10 HISTORY OF PRESENT ILLNESS: The patient is an 81 year old right-handed gentleman who has been referred for evaluation to treat severe pain due to pathologic compression fracture at L2. The patient is accompanied by his wife and a grandson. The patient was diagnosed to have multiple myeloma following admission for renal failure sometime in June of this year. The  patient was subsequently discharged and was doing reasonably well up until approximately 3 weeks ago when he started experiencing significantly worsening low back pain progressing to severe pain which he describes as a constant 10 out of 10 pain with moderate relief on a combination of Oxycontin and also tramadol for which she is taking around the clock. However this has resulted in him being significantly constipated. There is no radiation of this pain into the lower extremities. No autonomic dysfunction of bowel or bladder activity. Patient denies any recent chills, fever or rigors. His appetite remains decreased with him having lost about 30 pounds since June. Past Medical History: Hypertension. Osteoarthritis of the knee and hand. BPH. GERD. COPD PFT. Allergic rhinitis. Asthma due to rhinitis. Left basal cell skin cancer. Leg cramps. Right calf DVT in 2000. Lithotripsy. Patient was on Coumadin for six months. Kidney stone. Colonic polyps, small bowel incontinence. End-stage renal disease on hemodialysis. As mentioned above multiple myeloma being followed by oncology. Lumbar compression fractures as described. Medications: Albuterol. Omeprazole. Flonase. Symbicort. Amlodipine. Acyclovir. Compazine. Dexamethasone weekly prior to his chemo with Velcade. TUMS. Meclizine. Metoprolol. Oxycodone. Allergies: No known allergies. Social History: Patient is married, has two children alive and well. Patient does not smoke. Denies drinking alcohol or use of illicit medicines. Family History: Significant for cancer. Diabetes mellitus, high blood pressure. REVIEW OF SYSTEMS: Apart from above, essentially negative for pathologic symptomatology. PHYSICAL EXAMINATION: In significant distress on account of severe pain. Appears somewhat fidgety because of pain in his low back. Affect depressed. Neurologically otherwise intact. Alert, awake, oriented to time, place, space. Speech and comprehension within normal limits. No  lateralizing cranial nerve abnormalities, motor, sensory or coordination abnormalities. Station and gait not tested. Patient significantly tender in the lower lumbar region at the site of L2-L3. ASSESSMENT AND PLAN: The patient's recent MRI scan of the lumbosacral spine was reviewed with the patient, patient's wife and grandson. Multiple focal areas of mild luminal involvement were brought to the attention in the thoracolumbar spine. Of note was the compression deformity at L2. It was felt the patient would be a candidate for ablation followed by vertebral body augmentation. Should at the time of the procedure interval change in terms of compression deformities are seen in the vertebral bodies in the lumbar spine, these too may be considered for treatment at that time. This was explained to the patient and the patient's wife. The procedure, the risks, benefits and alternatives were all reviewed in detail. Questions were answered. Both the patient and the spouse want to  proceed with the procedure. This will be scheduled at the earliest possible. They were asked to call should they have any concerns or questions. Electronically Signed   By: Luanne Bras M.D.   On: 06/15/2015 11:33     CBC  Recent Labs Lab 06/26/2015 1530  WBC 17.2*  HGB 9.3*  HCT 26.9*  PLT 78*  MCV 105.5*  MCH 36.5*  MCHC 34.6  RDW 14.2    Chemistries   Recent Labs Lab 06/14/2015 1530  NA 136  K 5.1  CL 98*  CO2 25  GLUCOSE 158*  BUN 39*  CREATININE 9.57*  CALCIUM >15.0*   ------------------------------------------------------------------------------------------------------------------ CrCl cannot be calculated (Unknown ideal weight.). ------------------------------------------------------------------------------------------------------------------ No results for input(s): HGBA1C in the last 72  hours. ------------------------------------------------------------------------------------------------------------------ No results for input(s): CHOL, HDL, LDLCALC, TRIG, CHOLHDL, LDLDIRECT in the last 72 hours. ------------------------------------------------------------------------------------------------------------------ No results for input(s): TSH, T4TOTAL, T3FREE, THYROIDAB in the last 72 hours.  Invalid input(s): FREET3 ------------------------------------------------------------------------------------------------------------------ No results for input(s): VITAMINB12, FOLATE, FERRITIN, TIBC, IRON, RETICCTPCT in the last 72 hours.  Coagulation profile No results for input(s): INR, PROTIME in the last 168 hours.  No results for input(s): DDIMER in the last 72 hours.  Cardiac Enzymes No results for input(s): CKMB, TROPONINI, MYOGLOBIN in the last 168 hours.  Invalid input(s): CK ------------------------------------------------------------------------------------------------------------------ Invalid input(s): POCBNP   CBG:  Recent Labs Lab 07/11/2015 1738  GLUCAP 162*       EKG: Independently reviewed. Sinus tachycardia with PVCs   Assessment/Plan Principal Problem: Hypercalcemia secondary to Multiple myeloma:  Patient with history of multiple myeloma since June. Receiving Velcade treatments by Dr. Earlie Server. Calcium >15 on admission. CT and chest x-ray showing no acute abnormalities. Nephrology consulted in the ED recommended immediate hemodialysis  -Transfer to [facility] for hemodialysis per nephrology -Admit to telemetry bed -Consider notifying patient's oncologist if remains hospitalized over weekend  Acute encephalopathy: Acute. Secondary to above.  -Would monitor closely with neuro checks q2 hr -Asked to evaluate safety to eat when able  End-stage renal disease on hemodialysis [facility]): Nephrology consulted and ED for patient's hypercalcemia -Per nephrology     Leukocytosis: Acute. WBC 17.2 on admission patient afebrile. Question possibility of underlying infection as patient is immunocompromised. Patient had normal lactic acid and no other clear s source of infection -Recheck CBC in a.m.  Thrombocytopenia: Platelet count 78 on admission, previously 125 on 06/29/2015. - Continue to monitor  Essential hypertension: Uncontrolled. Blood pressure as high as 186/104 on admission -Continue amlodipine , metoprolol -Hydralazine when necessary systolic blood pressures greater than 433 diastolic greater than 295    COPD:stable -prn Duonebs for SOB -Continue Symbicort  Hyperglycemia: Glucose noted to be158 on admission. Patient has been on intermittent steroids likely worsening symptoms. -check hbga1c -Patient placed on a sensitive sliding scale insulin   Anemia of chronic disease: Stable. Hemoglobin and hematocrit of 0.3 and 26.9 respectively on admission. -CBC in a m  Fall: Acute. Per family patient fell backwards and did not hit his head. Initial physical exam and no significant areas of bony tenderness.  -Deferring further evaluation to acute hypercalcemia at this time may need further workup after acute issues resolve -Physical therapy to eval and treat   GERD  -Protonix  DVT prophylaxis heparin Code Status:   full Family Communication: bedside Disposition Plan: admit   Total time spent 55 minutes.Greater than 50% of this time was spent in counseling, explanation of diagnosis, planning of further management, and coordination of care  [place] Pager [phone]  If 7PM-7AM, please contact night-coverage [email or web address] Password [facility] 07/04/2015, 8:18 PM

## 2015-07-06 NOTE — Telephone Encounter (Signed)
Susanne Borders, NP at 06/21/2015 3:02 PM     Status: Signed       Expand All Collapse All   This provider received phone call from [facility] front lobby at 1450- informing me that pt's wife checked in to front desk and then stated that she was taking pt to the ED for evaluation.   Had just received paperwork from Nephorologist regarding labs drawn on 07/04/2015 which revealed hypercalcemia at 13.9.  From triage note received approximately 1 hour ago-patient's wife called back stating that patient had just fallen backwards landing on his sacral region. She denied patient hitting his head.  Apparently patient just recently underwent back surgery as well.  This provider called the emergency department charge nurse to inform of hypercalcemia paperwork just received this afternoon. Emergency department charge nurse informed this provider that there would be a significant wait time while in the emergency department, unfortunately.  Advised charge nurse to call this provider for any further questions. We will also alert the on call physician today. The cancer center

## 2015-07-07 DIAGNOSIS — C9002 Multiple myeloma in relapse: Secondary | ICD-10-CM

## 2015-07-07 LAB — GLUCOSE, CAPILLARY
GLUCOSE-CAPILLARY: 107 mg/dL — AB (ref 65–99)
Glucose-Capillary: 124 mg/dL — ABNORMAL HIGH (ref 65–99)
Glucose-Capillary: 155 mg/dL — ABNORMAL HIGH (ref 65–99)
Glucose-Capillary: 118 mg/dL — ABNORMAL HIGH (ref 65–99)

## 2015-07-07 LAB — CBC
HCT: 26 % — ABNORMAL LOW (ref 39.0–52.0)
Hemoglobin: 9.2 g/dL — ABNORMAL LOW (ref 13.0–17.0)
MCH: 36.8 pg — ABNORMAL HIGH (ref 26.0–34.0)
MCHC: 35.4 g/dL (ref 30.0–36.0)
MCV: 104 fL — ABNORMAL HIGH (ref 78.0–100.0)
Platelets: 68 10*3/uL — ABNORMAL LOW (ref 150–400)
RBC: 2.5 MIL/uL — ABNORMAL LOW (ref 4.22–5.81)
RDW: 14.2 % (ref 11.5–15.5)
WBC: 18 10*3/uL — ABNORMAL HIGH (ref 4.0–10.5)

## 2015-07-07 LAB — RENAL FUNCTION PANEL
Albumin: 3 g/dL — ABNORMAL LOW (ref 3.5–5.0)
Albumin: 3.1 g/dL — ABNORMAL LOW (ref 3.5–5.0)
Anion gap: 10 (ref 5–15)
Anion gap: 13 (ref 5–15)
BUN: 19 mg/dL (ref 6–20)
BUN: 44 mg/dL — ABNORMAL HIGH (ref 6–20)
CALCIUM: 14.8 mg/dL — AB (ref 8.9–10.3)
CO2: 28 mmol/L (ref 22–32)
CO2: 23 mmol/L (ref 22–32)
CREATININE: 10.31 mg/dL — AB (ref 0.61–1.24)
Calcium: 11 mg/dL — ABNORMAL HIGH (ref 8.9–10.3)
Chloride: 96 mmol/L — ABNORMAL LOW (ref 101–111)
Chloride: 99 mmol/L — ABNORMAL LOW (ref 101–111)
Creatinine, Ser: 4.6 mg/dL — ABNORMAL HIGH (ref 0.61–1.24)
GFR calc Af Amer: 12 mL/min — ABNORMAL LOW (ref >60)
GFR calc Af Amer: 5 mL/min — ABNORMAL LOW (ref >60)
GFR calc non Af Amer: 11 mL/min — ABNORMAL LOW (ref >60)
GFR calc non Af Amer: 4 mL/min — ABNORMAL LOW (ref >60)
GLUCOSE: 169 mg/dL — AB (ref 65–99)
Glucose, Bld: 111 mg/dL — ABNORMAL HIGH (ref 65–99)
Phosphorus: 3.9 mg/dL (ref 2.5–4.6)
Phosphorus: 8.2 mg/dL — ABNORMAL HIGH (ref 2.5–4.6)
Potassium: 3.6 mmol/L (ref 3.5–5.1)
Potassium: 5.1 mmol/L (ref 3.5–5.1)
SODIUM: 132 mmol/L — AB (ref 135–145)
Sodium: 137 mmol/L (ref 135–145)

## 2015-07-07 MED ORDER — HYDRALAZINE HCL 20 MG/ML IJ SOLN
10.0000 mg | INTRAMUSCULAR | Status: DC | PRN
  Filled 2015-07-07 (×2): qty 1

## 2015-07-07 NOTE — Progress Notes (Addendum)
Patient arrived on stretcher via [facility]. Patient alert to self. Patient oriented to staff, room and unit. Patient placed on telemetry, [place] notified. Skin assessment completed with two RN's, no skin issues noted. Patient's IV clean, dry, and intact. Pain denies pain.  Orders have been reviewed and implemented. Call light has been placed within reach and bed alarm has been activated.  RN will continue to monitor the patient.  Nena Polio BSN, RN  Phone Number: [phone]

## 2015-07-07 NOTE — Procedures (Signed)
I was present at this session.  I have reviewed the session itself and made appropriate changes.  bp 140-190.  Access prss ok. Hd for ^ Ca.  Dae Highley L 11/26/20164:44 PM

## 2015-07-07 NOTE — Evaluation (Signed)
Physical Therapy Evaluation Patient Details Name: Justin Woods MRN: [id number] DOB: 1931/02/01 Today's Date: 07/07/2015   History of Present Illness  Justin Woods is a 81 year old gentleman with a history of multiple myeloma and end-stage renal disease who requires hemodialysis. He presented with severe hypercalcemia having calcium greater than 15 undergoing hemodialysis overnight along with the administration of calcitonin.  Clinical Impression  Pt admitted with above diagnosis. Pt currently with functional limitations due to the deficits listed below (see PT Problem List). Pt very lethargic and confused.  Did arouse but only enough to roll and be scooted up in bed.  Supportive wife.  Hopeful he will improve and go home with HHPT.  Wife wants rollator for pt and this PT agrees.  Will follow acutely.  Pt will benefit from skilled PT to increase their independence and safety with mobility to allow discharge to the venue listed below.     Follow Up Recommendations Home health PT;Supervision/Assistance - 24 hour    Equipment Recommendations  Other (comment) (rollator)    Recommendations for Other Services       Precautions / Restrictions Precautions Precautions: Fall Restrictions Weight Bearing Restrictions: No      Mobility  Bed Mobility Overal bed mobility: Needs Assistance;+2 for physical assistance Bed Mobility: Rolling Rolling: Mod assist;Max assist;+2 for physical assistance         General bed mobility comments: Needed mod to max assist to roll and total assist to scoot pt up in bed.  Pt has difficulty following commands.  Has wrist restraints on as pt was agitated earlier today.   Transfers                 General transfer comment: did not get OOB due to periods of lethargy  Ambulation/Gait                Stairs            Wheelchair Mobility    Modified Rankin (Stroke Patients Only)       Balance Overall balance assessment: Needs  assistance;History of Falls     Sitting balance - Comments: NT       Standing balance comment: NT                             Pertinent Vitals/Pain Pain Assessment: Faces Faces Pain Scale: Hurts little more Pain Location: all over Pain Descriptors / Indicators: Grimacing;Sore Pain Intervention(s): Limited activity within patient's tolerance;Monitored during session;Repositioned  VSS    Home Living Family/patient expects to be discharged to:: Private residence Living Arrangements: Spouse/significant other Available Help at Discharge: Family;Available 24 hours/day Type of Home: House Home Access: Stairs to enter Entrance Stairs-Rails: None Entrance Stairs-Number of Steps: 2 Home Layout: One level Home Equipment: Environmental consultant - 2 wheels      Prior Function Level of Independence: Independent               Hand Dominance        Extremity/Trunk Assessment   Upper Extremity Assessment: Defer to OT evaluation           Lower Extremity Assessment: Generalized weakness      Cervical / Trunk Assessment: Kyphotic  Communication   Communication: No difficulties  Cognition Arousal/Alertness: Lethargic Behavior During Therapy: Anxious Overall Cognitive Status: Impaired/Different from baseline Area of Impairment: Orientation;Memory;Following commands;Safety/judgement;Awareness;Problem solving Orientation Level: Disoriented to;Place;Time;Situation   Memory: Decreased short-term memory Following Commands: Follows one step commands inconsistently;Follows  one step commands with increased time Safety/Judgement: Decreased awareness of safety;Decreased awareness of deficits Awareness: Intellectual Problem Solving: Slow processing;Decreased initiation;Difficulty sequencing;Requires verbal cues      General Comments      Exercises General Exercises - Lower Extremity Ankle Circles/Pumps: AAROM;Both;5 reps;Supine Heel Slides: AAROM;Both;5 reps;Supine       Assessment/Plan    PT Assessment Patient needs continued PT services  PT Diagnosis Generalized weakness   PT Problem List Decreased activity tolerance;Decreased balance;Decreased mobility;Decreased knowledge of use of DME;Decreased safety awareness;Decreased knowledge of precautions;Pain  PT Treatment Interventions DME instruction;Gait training;Stair training;Functional mobility training;Therapeutic activities;Therapeutic exercise;Balance training;Patient/family education   PT Goals (Current goals can be found in the Care Plan section) Acute Rehab PT Goals Patient Stated Goal: to go home PT Goal Formulation: With patient/family Time For Goal Achievement: 07/21/15 Potential to Achieve Goals: Good    Frequency Min 3X/week   Barriers to discharge        Co-evaluation               End of Session Equipment Utilized During Treatment: Gait belt;Oxygen Activity Tolerance: Patient limited by fatigue;Patient limited by lethargy Patient left: in bed;with call bell/phone within reach;with bed alarm set;with family/visitor present;with restraints reapplied Nurse Communication: Mobility status;Need for lift equipment         Time: 1213-1228 PT Time Calculation (min) (ACUTE ONLY): 15 min   Charges:   PT Evaluation $Initial PT Evaluation Tier I: 1 Procedure     PT G CodesDenice Paradise 18-Jul-2015, 3:28 PM  [facility] Acute Rehabilitation [phone] (pager)

## 2015-07-07 NOTE — Progress Notes (Signed)
[facility] PROGRESS NOTE  Justin Woods YTK:[id number] DOB: [birth year] DOA: 06/14/2015 PCP: Wenda Low, MD  Assessment/Plan: 1. Severe hypercalcemia -Justin Woods is a 81 year old gentleman with a history of multiple myeloma and end-stage renal disease who requires hemodialysis. He presented with severe hypercalcemia having calcium greater than 15 undergoing hemodialysis overnight along with the administration of calcitonin. -Hypercalcemia likely related to multiple myeloma -Calcium coming down to 11 on repeat lab work. Despite this improvement he remains encephalopathic this morning, is unable to provide history or participate in his own plan of care.  2.  Failure to thrive/functional decline/acute encephalopathy -Family members reporting progressive functional decline over the past 2 weeks characterize by mental status changes, confusion, generalized weakness, having falls at home. -Suspect that hypercalcemia is a significant contributor, however, it is likely that this is multifactorial. He has multiple comorbidities including multiple myeloma, end-stage renal disease on hemodialysis, hypertension.  3.  End-stage renal disease. -Patient currently on hemodialysis, was evaluated by nephrology during this hospitalization undergoing hemodialysis overnight. -Has history of multiple myeloma  4.  Thrombocytopenia. -Labs revealing platelet count of 68,000, which could be secondary to velcade therapy, however, previous labs from 06/29/2015 revealed platelet count of 125,000 then.  -He does not have evidence of active bleed -Will obtain blood cultures to assess for possible underlying infectious process, particularly with leukocytosis  5.  Leukocytosis. -Presenting lab per showing white count of 17,200. -This is increased from 11,800 on 06/29/2015. -Initial workup not revealing obvious source of infection. He has been afebrile. Will obtain blood cultures.  6.  Hypertension. -Over the  course of today patient having elevated blood pressures with systolic blood pressures in the 170s to 190s, will add as needed hydralazine 10 mg IV for systolic blood pressures greater than 165  7.  Status post fall -Likely related to functional decline and consequences of severe hypercalcemia, multiple myeloma, end-stage renal disease.  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 He is undergoing therapy with Velcade and Decadron. Patient had been receiving his care at the cancer center. He was brought to the emergency department on 06/20/2015 after having a fall at home. Family members reporting a progressive functional decline over the past 2 weeks, having spells of increased confusion. Initial lab work revealed severe hypercalcemia having calcium greater than 15. Nephrology was consulted as he was started on calcitonin along with hemodialysis. His calcium came down to 11 after hemodialysis.    Objective: Filed Vitals:   07/07/15 0624 07/07/15 0936  BP: 137/55 193/74  Pulse: 100 100  Temp: 98.2 F (36.8 C) 98.9 F (37.2 C)  Resp: 18 18    Intake/Output Summary (Last 24 hours) at 07/07/15 1416 Last data filed at 07/07/15 0600  Gross per 24 hour  Intake      0 ml  Output      0 ml  Net      0 ml   Filed Weights   06/20/2015 2323 07/07/15 0115 07/07/15 0529  Weight: 79 kg (174 lb 2.6 oz) 80.3 kg (177 lb 0.5 oz) 79.8 kg (175 lb 14.8 oz)    Exam:   General:  Patient is encephalopathic, ill-appearing, unable to provide  history follow commands  Cardiovascular: Tachycardic, regular rate rhythm  normal S1-S2 no murmurs or gallops  Respiratory: Normal respiratory effort, diminished breath sounds bilaterally  Abdomen: Soft nontender nondistended  Musculoskeletal: No rashes or lesions  Data Reviewed: Basic Metabolic Panel:  Recent Labs Lab 06/14/2015 1530 06/29/2015 2340 07/07/15 1000  NA 136 132* 137  K 5.1 5.1 3.6  CL 98* 96* 99*  CO2 25 23 28   GLUCOSE 158* 169* 111*  BUN 39* 44* 19  CREATININE 9.57* 10.31* 4.60*  CALCIUM >15.0* 14.8* 11.0*  PHOS  --  8.2* 3.9   Liver Function Tests:  Recent Labs Lab 06/25/2015 2340 07/07/15 1000  ALBUMIN 3.1* 3.0*   No results for input(s): LIPASE, AMYLASE in the last 168 hours. No results for input(s): AMMONIA in the last 168 hours. CBC:  Recent Labs Lab 06/22/2015 1530 07/04/2015 2340  WBC 17.2* 18.0*  HGB 9.3* 9.2*  HCT 26.9* 26.0*  MCV 105.5* 104.0*  PLT 78* 68*   Cardiac Enzymes: No results for input(s): CKTOTAL, CKMB, CKMBINDEX, TROPONINI in the last 168 hours. BNP (last 3 results) No results for input(s): BNP in the last 8760 hours.  ProBNP (last 3 results) No results for input(s): PROBNP in the last 8760 hours.  CBG:  Recent Labs Lab 06/20/2015 1738 06/30/2015 2327 07/07/15 0257 07/07/15 0822 07/07/15 1127  GLUCAP 162* 160* 124* 155* 118*    Recent Results (from the past 240 hour(s))  TECHNOLOGIST REVIEW     Status: None   Collection Time: 06/29/15  2:26 PM  Result Value Ref Range Status   Technologist Review Occas. Meta and Myelo  Final     Studies: Dg Chest 2 View  07/07/2015  CLINICAL DATA:  Altered mental status. 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 Since surgery patient is at increased confusion and weakness. Witnessed fall today. EXAM: CHEST  2 VIEW COMPARISON:  02/07/2015 FINDINGS: Right central venous dialysis catheter with lead tips over the cavoatrial junction and RA region. Shallow inspiration with atelectasis in the lung  bases. Normal heart size and pulmonary vascularity. No focal airspace disease or consolidation. No pneumothorax. No blunting of costophrenic angles. Degenerative changes in the spine. IMPRESSION: Shallow inspiration with atelectasis in the lung bases. No focal consolidation. Electronically Signed   By: 02/09/2015 M.D.   On: 07/09/2015 18:22   Ct Head Wo Contrast  06/12/2015  CLINICAL DATA:  Altered mental status. EXAM: CT HEAD WITHOUT CONTRAST TECHNIQUE: Contiguous axial images were obtained from the base of the skull through the vertex without intravenous contrast. COMPARISON:  MRI brain 03/05/2015 FINDINGS: Ventricles, cisterns and other CSF spaces are within normal. There is no mass, mass effect, shift of midline structures or acute hemorrhage. Subtle chronic ischemic microvascular disease. Mild early basal ganglia calcification bilaterally. No evidence of acute infarction. Bilateral hearing aids. Remaining bones and soft tissues are within normal. IMPRESSION: No acute intracranial findings. Minimal chronic ischemic microvascular disease. Electronically Signed   By: 03/07/2015 M.D.   On: 06/23/2015 18:25    Scheduled Meds: . acyclovir  400 mg Oral BID  . amLODipine  5 mg Oral Daily  . budesonide-formoterol  2 puff Inhalation BID  . calcium carbonate  2,000 mg Oral TID WC  . fluticasone  1 spray Each Nare Daily  . gabapentin  100 mg Oral QHS  . heparin  5,000 Units Subcutaneous 3 times per day  . insulin aspart  0-9 Units Subcutaneous TID WC  .  metoprolol succinate  25 mg Oral Daily  . pantoprazole  40 mg Oral Daily  . sodium chloride  3 mL Intravenous Q12H   Continuous Infusions:   Principal Problem:   Hypercalcemia of malignancy Active Problems:   End-stage renal disease on hemodialysis (HCC)   Essential hypertension   Multiple myeloma (HCC)   Leukocytosis   Anemia of chronic disease   Acute encephalopathy   Hypercalcemia    Time spent: 35 min    Kelvin Cellar  [facility] Pager [phone]. If 7PM-7AM, please contact night-coverage at [email or web address], password [facility] 07/07/2015, 2:16 PM  LOS: 1 day

## 2015-07-07 NOTE — Progress Notes (Signed)
[facility] Progress Note  Assessment/Plan: 1. Hypercalcemia - presumably due to multiple myeloma but possibly complicated by vitamin D therapy and calcium carbonate 1. Plan for HD tonight with low calcium bath and follow serum calcium levels.  2. Will also dose calcitonin to help lower calcium levels 3. Await Heme input for other therapies related to myeloma 2. 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  then 2 Calcium, Repeat Ca improved to 11.    3. Hypertension/volume - HD last night. UFG net UF 500. BP improved.  4. Anemia - on ESA. HGB 9.2 follow HGB.  5. Metabolic bone disease - Stopped vitamin D earlier this week and will stop calcium carbonate for now and follow 6. Nutrition - per primary svc-unable to eat now due to confusion. High risk for aspiration.  7. Vascular Access - has RIJ TDC and maturing L Cimino AVF + thrill.  8. AMS- presumably due to hypercalcemia follow clinically, CT scan negative. In restraint at present. Not following commands or responding to verbal stimuli.  9. Multiple Myeloma - missed today's cycle of velcade and decadron. Await Heme input for other possible therapies.  Rita H. Brown NP-C 07/07/2015, 9:30 AM  [facility] [phone]  Subjective:  Patient is nonverbal, in wrist restraints. Moaning, does not open eyes to verbal stimuli.   Objective Filed Vitals:   07/07/15 0529 07/07/15 0536 07/07/15 0624 07/07/15 0924  BP: 191/80 144/79 137/55   Pulse: 107 104 100   Temp:   98.2 F (36.8 C)   TempSrc: Oral     Resp: _0 Height:      Weight: 79.8 kg (175 lb 14.8 oz)     SpO2:   94% 95%   Physical Exam General: Chronically ill appearing male in restraints, agitated.  Heart: S1, S2, II/VI M.  Lungs: bilateral breath sounds CTA Abdomen: Abdomen soft nontender Extremities: No LE edema. Pulses intact.  Dialysis Access: R IJ HD catheter and L cimino AVF  +T/B  Dialysis Orders: Center: [facility] on TTS . EDW 78kg HD Bath 2K/2.25Ca Time 4 hours Heparin 2400. Access RIJ TDC and maturing L cimino AVF BFR 400 DFR 800  Micera 260mg every 2 weeks  Additional Objective Labs: Basic Metabolic Panel:  Recent Labs Lab 06/25/2015 1530 07/02/2015 2340 07/07/15 1000  NA 136 132* 137  K 5.1 5.1 3.6  CL 98* 96* 99*  CO2 _1 GLUCOSE 158* 169* 111*  BUN 39* 44* 19  CREATININE 9.57* 10.31* 4.60*  CALCIUM >15.0* 14.8* 11.0*  PHOS  --  8.2* 3.9   Liver Function Tests:  Recent Labs Lab 06/23/2015 2340  ALBUMIN 3.1*   No results for input(s): LIPASE, AMYLASE in the last 168 hours. CBC:  Recent Labs Lab 06/25/2015 1530 06/20/2015 2340  WBC 17.2* 18.0*  HGB 9.3* 9.2*  HCT 26.9* 26.0*  MCV 105.5* 104.0*  PLT 78* 68*   Blood Culture No results found for: SDES, SPECREQUEST, CULT, REPTSTATUS  Cardiac Enzymes: No results for input(s): CKTOTAL, CKMB, CKMBINDEX, TROPONINI in the last 168 hours. CBG:  Recent Labs Lab 06/19/2015 1738 06/24/2015 2327 07/07/15 0257 07/07/15 0822  GLUCAP 162* 160* 124* 155*   Iron Studies: No results for input(s): IRON, TIBC, TRANSFERRIN, FERRITIN in the last 72 hours. _2 @ Studies/Results: Dg Chest 2 View  06/15/2015  CLINICAL DATA:  Altered mental status. 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 Since surgery patient is at increased confusion and weakness. Witnessed fall today. EXAM: CHEST  2 VIEW COMPARISON:  02/07/2015 FINDINGS: Right central venous dialysis catheter with lead tips over the cavoatrial junction and RA region. Shallow inspiration with atelectasis in the lung bases. Normal heart size and pulmonary vascularity. No focal airspace disease or consolidation. No pneumothorax. No blunting of costophrenic angles. Degenerative changes in the spine. IMPRESSION: Shallow inspiration with atelectasis in the lung bases. No focal  consolidation. Electronically Signed   By: Lucienne Capers M.D.   On: 06/13/2015 18:22   Ct Head Wo Contrast  06/15/2015  CLINICAL DATA:  Altered mental status. EXAM: CT HEAD WITHOUT CONTRAST TECHNIQUE: Contiguous axial images were obtained from the base of the skull through the vertex without intravenous contrast. COMPARISON:  MRI brain 03/05/2015 FINDINGS: Ventricles, cisterns and other CSF spaces are within normal. There is no mass, mass effect, shift of midline structures or acute hemorrhage. Subtle chronic ischemic microvascular disease. Mild early basal ganglia calcification bilaterally. No evidence of acute infarction. Bilateral hearing aids. Remaining bones and soft tissues are within normal. IMPRESSION: No acute intracranial findings. Minimal chronic ischemic microvascular disease. Electronically Signed   By: Marin Olp M.D.   On: 07/03/2015 18:25   Medications:   . acyclovir  400 mg Oral BID  . amLODipine  5 mg Oral Daily  . budesonide-formoterol  2 puff Inhalation BID  . calcium carbonate  2,000 mg Oral TID WC  . fluticasone  1 spray Each Nare Daily  . gabapentin  100 mg Oral QHS  . heparin  5,000 Units Subcutaneous 3 times per day  . insulin aspart  0-9 Units Subcutaneous TID WC  . metoprolol succinate  25 mg Oral Daily  . pantoprazole  40 mg Oral Daily  . sodium chloride  3 mL Intravenous Q12H     I have seen and examined this patient and agree with plan as outlined by Juanell Fairly, NP.  Mr. Villalon is still confused despite marked improvement of serum calcium levels.  Will plan for another session of HD with low Calcium bath today.  Awaiting Hematology input regarding therapy and prognosis.  Will continue to follow. Saul Dorsi A,MD 07/07/2015 12:30 PM

## 2015-07-07 NOTE — Progress Notes (Signed)
"  Justin Woods" in lab called with critical lab value of: K+ 3.6, Cr 4.6, and Ca 11.0. All labs improved from previous lab and were drawn after hemodialysis. Dr.Zamora to be texted.

## 2015-07-08 LAB — BASIC METABOLIC PANEL
Anion gap: 8 (ref 5–15)
BUN: 15 mg/dL (ref 6–20)
CALCIUM: 10.4 mg/dL — AB (ref 8.9–10.3)
CO2: 31 mmol/L (ref 22–32)
CREATININE: 3.53 mg/dL — AB (ref 0.61–1.24)
Chloride: 99 mmol/L — ABNORMAL LOW (ref 101–111)
GFR calc non Af Amer: 15 mL/min — ABNORMAL LOW (ref >60)
GFR, EST AFRICAN AMERICAN: 17 mL/min — AB (ref >60)
GLUCOSE: 86 mg/dL (ref 65–99)
Potassium: 3.3 mmol/L — ABNORMAL LOW (ref 3.5–5.1)
Sodium: 138 mmol/L (ref 135–145)

## 2015-07-08 LAB — GLUCOSE, CAPILLARY
GLUCOSE-CAPILLARY: 96 mg/dL (ref 65–99)
Glucose-Capillary: 93 mg/dL (ref 65–99)
Glucose-Capillary: 85 mg/dL (ref 65–99)
Glucose-Capillary: 109 mg/dL — ABNORMAL HIGH (ref 65–99)
Glucose-Capillary: 94 mg/dL (ref 65–99)

## 2015-07-08 NOTE — Progress Notes (Signed)
[facility] PROGRESS NOTE  Justin SCIALDONE GDJ:[id number] DOB: [birth year] DOA: 06/12/2015 PCP: Wenda Low, MD  Assessment/Plan: 1. Severe hypercalcemia -Justin Woods is a 81 year old gentleman with a history of multiple myeloma and end-stage renal disease who requires hemodialysis. He presented with severe hypercalcemia having calcium greater than 15 undergoing hemodialysis overnight along with the administration of calcitonin. -Hypercalcemia likely related to multiple myeloma -On 07/08/2015 a.m. lab work showing calcium of 10.4 -With regard to his mental status changes he seems much improved today as he was awake and alert, having his breakfast.  2.  Failure to thrive/functional decline/acute encephalopathy -Family members reporting progressive functional decline over the past 2 weeks characterize by mental status changes, confusion, generalized weakness, having falls at home. -Suspect that hypercalcemia is a significant contributor, however, it is likely that this is multifactorial. He has multiple comorbidities including multiple myeloma, end-stage renal disease on hemodialysis, hypertension.  3.  End-stage renal disease. -Patient currently on hemodialysis, was evaluated by nephrology during this hospitalization undergoing hemodialysis overnight. -Has history of multiple myeloma  4.  Thrombocytopenia. -Labs revealing platelet count of 68,000, which could be secondary to velcade therapy -He does not have evidence of active bleed -A.m. lab work on 07/08/2015 showing platelet count of 60,000  5.  Leukocytosis. -Presenting lab per showing white count of 17,200. -This is increased from 11,800 on 06/29/2015. -Initial workup not revealing obvious source of infection. He has been afebrile. -Blood cultures obtained on 07/07/2015 show no growth after overnight incubation  6.  Hypertension. -Blood pressures better controlled today, status post hemodialysis. -Continue amlodipine 5 mg  by mouth daily, metoprolol 25 mg daily  7.  Status post fall -Likely related to functional decline and consequences of severe hypercalcemia, multiple myeloma, end-stage renal disease.  8.  Multiple myeloma -On 07/08/2015 I discussed case with Dr.Penland on medical oncology. We discussed his current treatment for multiple myeloma. She suggested holding Velcade therapy for now until he sees his primary oncologist Dr Julien Nordmann in the cancer center.    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 He is undergoing therapy with Velcade and Decadron. Patient had been receiving his care at the cancer center. He was brought to the emergency department on 06/18/2015 after having a fall at home. Family members reporting a progressive functional decline over the past 2 weeks, having spells of increased confusion. Initial lab work revealed severe hypercalcemia having calcium greater than 15. Nephrology was consulted as he was started on calcitonin along with hemodialysis. His calcium came down to 11 after hemodialysis.    Objective: Filed Vitals:   07/08/15 0518 07/08/15 0834  BP: 136/58 130/67  Pulse: 95 90  Temp: 97.9 F (36.6 C) 97.9 F (36.6 C)  Resp: 16 16    Intake/Output Summary (Last 24 hours) at 07/08/15 1435 Last data filed at 07/08/15 0927  Gross per 24 hour  Intake    603 ml  Output   1008 ml  Net   -405 ml   Filed Weights   07/07/15 1634 07/07/15 1935 07/07/15 2023  Weight: 80.4 kg (177 lb 4 oz) 79.4 kg (175 lb 0.7 oz) 79.4 kg (175 lb 0.7 oz)  Exam:   General:  Patient is awake and  alert, similar improvement to his mentation compared to yesterday.  Cardiovascular: Tachycardic, regular rate rhythm normal S1-S2 no murmurs or gallops  Respiratory: Normal respiratory effort, diminished breath sounds bilaterally  Abdomen: Soft nontender nondistended  Musculoskeletal: No rashes or lesions  Data Reviewed: Basic Metabolic Panel:  Recent Labs Lab 06/15/2015 1530 06/12/2015 2340 07/07/15 1000 07/08/15 0334  NA 136 132* 137 138  K 5.1 5.1 3.6 3.3*  CL 98* 96* 99* 99*  CO2 $Re'25 23 28 31  'RkI$ GLUCOSE 158* 169* 111* 86  BUN 39* 44* 19 15  CREATININE 9.57* 10.31* 4.60* 3.53*  CALCIUM >15.0* 14.8* 11.0* 10.4*  PHOS  --  8.2* 3.9  --    Liver Function Tests:  Recent Labs Lab 07/07/2015 2340 07/07/15 1000  ALBUMIN 3.1* 3.0*   No results for input(s): LIPASE, AMYLASE in the last 168 hours. No results for input(s): AMMONIA in the last 168 hours. CBC:  Recent Labs Lab 07/05/2015 1530 06/12/2015 2340  WBC 17.2* 18.0*  HGB 9.3* 9.2*  HCT 26.9* 26.0*  MCV 105.5* 104.0*  PLT 78* 68*   Cardiac Enzymes: No results for input(s): CKTOTAL, CKMB, CKMBINDEX, TROPONINI in the last 168 hours. BNP (last 3 results) No results for input(s): BNP in the last 8760 hours.  ProBNP (last 3 results) No results for input(s): PROBNP in the last 8760 hours.  CBG:  Recent Labs Lab 07/07/15 1127 07/07/15 2020 07/08/15 0309 07/08/15 0810 07/08/15 1139  GLUCAP 118* 107* 93 85 96    Recent Results (from the past 240 hour(s))  TECHNOLOGIST REVIEW     Status: None   Collection Time: 06/29/15  2:26 PM  Result Value Ref Range Status   Technologist Review Occas. Meta and Myelo  Final  Culture, blood (routine x 2)     Status: None (Preliminary result)   Collection Time: 07/07/15  4:34 PM  Result Value Ref Range Status   Specimen Description BLOOD URINE, CATHETERIZED  Final   Special Requests BOTTLES DRAWN AEROBIC AND ANAEROBIC 10CC  Final   Culture NO GROWTH < 24 HOURS  Final   Report  Status PENDING  Incomplete  Culture, blood (routine x 2)     Status: None (Preliminary result)   Collection Time: 07/07/15  5:20 PM  Result Value Ref Range Status   Specimen Description BLOOD  Final   Special Requests BOTTLES DRAWN AEROBIC AND ANAEROBIC 10CC  Final   Culture NO GROWTH < 24 HOURS  Final   Report Status PENDING  Incomplete     Studies: Dg Chest 2 View  06/17/2015  CLINICAL DATA:  Altered mental status. 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 Since surgery patient is at increased confusion and weakness. Witnessed fall today. EXAM: CHEST  2 VIEW COMPARISON:  02/07/2015 FINDINGS: Right central venous dialysis catheter with lead tips over the cavoatrial junction and RA region. Shallow inspiration with atelectasis in the lung bases. Normal heart size and pulmonary vascularity. No focal airspace disease or consolidation. No pneumothorax. No blunting of costophrenic angles. Degenerative changes in the spine. IMPRESSION: Shallow inspiration with atelectasis in the lung bases. No focal consolidation. Electronically Signed   By: Lucienne Capers M.D.   On: 07/04/2015 18:22   Ct Head Wo Contrast  07/01/2015  CLINICAL DATA:  Altered mental status. EXAM: CT HEAD WITHOUT CONTRAST TECHNIQUE: Contiguous axial images were obtained from the base of the skull through the vertex  without intravenous contrast. COMPARISON:  MRI brain 03/05/2015 FINDINGS: Ventricles, cisterns and other CSF spaces are within normal. There is no mass, mass effect, shift of midline structures or acute hemorrhage. Subtle chronic ischemic microvascular disease. Mild early basal ganglia calcification bilaterally. No evidence of acute infarction. Bilateral hearing aids. Remaining bones and soft tissues are within normal. IMPRESSION: No acute intracranial findings. Minimal chronic ischemic microvascular disease. Electronically Signed   By: Marin Olp M.D.   On:  07/10/2015 18:25    Scheduled Meds: . acyclovir  400 mg Oral BID  . amLODipine  5 mg Oral Daily  . budesonide-formoterol  2 puff Inhalation BID  . calcium carbonate  2,000 mg Oral TID WC  . fluticasone  1 spray Each Nare Daily  . gabapentin  100 mg Oral QHS  . heparin  5,000 Units Subcutaneous 3 times per day  . insulin aspart  0-9 Units Subcutaneous TID WC  . metoprolol succinate  25 mg Oral Daily  . pantoprazole  40 mg Oral Daily  . sodium chloride  3 mL Intravenous Q12H   Continuous Infusions:   Principal Problem:   Hypercalcemia of malignancy Active Problems:   End-stage renal disease on hemodialysis (HCC)   Essential hypertension   Multiple myeloma (HCC)   Leukocytosis   Anemia of chronic disease   Acute encephalopathy   Hypercalcemia    Time spent: 30 min    Kelvin Cellar  [facility] Pager [phone]. If 7PM-7AM, please contact night-coverage at [email or web address], password [facility] 07/08/2015, 2:35 PM  LOS: 2 days

## 2015-07-08 NOTE — Care Management Note (Signed)
Case Management Note  Patient Details  Name: SASCHA BAUGHER MRN: [id number] Date of Birth: [birth year]  Subjective/Objective:        Patient  Transferred from [facility] ED to [facility] for HD hypercalemia with AMS ESRD on HD M/WF       Action/Plan: CM met with patient and wife concerning transitional care. Patient lives with wife as primary caregiver. Patient ambulated with walker prior to this admission without much assistance.  Patient has a private HHA coming in a couple of hours per week to assist with care.  Discussed recommendations for [facility] PT patient and wife agreeable [facility] selected for [facility] services. HH and [facility] orders needed. CM will follow up for orders and place the referral with [facility]  Expected Discharge Date:     07/10/15             Expected Discharge Plan:  [place]  In-House Referral:     Discharge planning Services  CM Consult  Post Acute Care Choice:    Choice offered to:  Patient  DME Arranged:    DME Agency:  [place] Arranged:  RN, PT/OT/SLP [facility] Agency:  [place]  Status of Service:  Completed, signed off  Medicare Important Message Given:   yes Date Medicare IM Given:    Medicare IM give by:    Date Additional Medicare IM Given:    Additional Medicare Important Message give by:     If discussed at [place] of Stay Meetings, dates discussed:    Additional CommentsLaurena Slimmer, RN 07/08/2015, 9:33 AM

## 2015-07-08 NOTE — Progress Notes (Signed)
Patient ID: Justin Woods, male   DOB: May 26, 1931, 81 y.o.   MRN: [id number]  [facility] Progress Note    Subjective:   More awake and alert this am   Objective:   BP 136/58 mmHg  Pulse 95  Temp(Src) 97.9 F (36.6 C) (Oral)  Resp 16  Ht $R'5\' 6"'AL$  (1.676 m)  Wt 79.4 kg (175 lb 0.7 oz)  BMI 28.27 kg/m2  SpO2 95%  Intake/Output: I/O last 3 completed shifts: In: 363 [P.O.:360; I.V.:3] Out: 1008 [Other:1008]   Intake/Output this shift:    Weight change: 0.113 kg (4 oz)  Physical Exam: Gen:WD elderly WM in NAd CVS:no rub Resp:cta WNI:OEVOJJ Ext:no edema, L Cimino AVF +T/B  Labs: BMET  Recent Labs Lab 07/05/2015 1530 06/12/2015 2340 07/07/15 1000 07/08/15 0334  NA 136 132* 137 138  K 5.1 5.1 3.6 3.3*  CL 98* 96* 99* 99*  CO2 $Re'25 23 28 31  'ttT$ GLUCOSE 158* 169* 111* 86  BUN 39* 44* 19 15  CREATININE 9.57* 10.31* 4.60* 3.53*  ALBUMIN  --  3.1* 3.0*  --   CALCIUM >15.0* 14.8* 11.0* 10.4*  PHOS  --  8.2* 3.9  --    CBC  Recent Labs Lab 06/13/2015 1530 06/25/2015 2340  WBC 17.2* 18.0*  HGB 9.3* 9.2*  HCT 26.9* 26.0*  MCV 105.5* 104.0*  PLT 78* 68*    '@IMGRELPRIORS'$ @ Medications:    . acyclovir  400 mg Oral BID  . amLODipine  5 mg Oral Daily  . budesonide-formoterol  2 puff Inhalation BID  . calcium carbonate  2,000 mg Oral TID WC  . fluticasone  1 spray Each Nare Daily  . gabapentin  100 mg Oral QHS  . heparin  5,000 Units Subcutaneous 3 times per day  . insulin aspart  0-9 Units Subcutaneous TID WC  . metoprolol succinate  25 mg Oral Daily  . pantoprazole  40 mg Oral Daily  . sodium chloride  3 mL Intravenous Q12H   Dialysis Access: R IJ HD catheter and L cimino AVF +T/B  Dialysis Orders: Center: [facility] on MWF . EDW 78kg HD Bath 2K/2.25Ca Time 4 hours Heparin 2400. Access RIJ TDC and maturing L cimino AVF BFR 400 DFR 800  Micera 272mcg every 2 weeks  Assessment/ Plan:   Assessment/Plan: 1. Hypercalcemia - presumably due to multiple  myeloma but possibly complicated by vitamin D therapy and calcium carbonate 1. S/p 2 sessions of HD with marked improvement in calcium levels.  2. Off vitamin D and calcium-based binders 3. Await Heme input for other therapies related to myeloma 2. ESRD - Due to myeloma cast nephropathy.Will continue to follow calcium levels and cont with MWF schedule tomorrow with low calcium bath.    3. Hypertension/volume - HD last night. UFG net UF 500. BP improved.  4. Anemia - on ESA. HGB 9.2 follow HGB.  5. Metabolic bone disease - Stopped vitamin D earlier this week and will stop calcium carbonate for now and follow 6. Nutrition - per primary svc-unable to eat now due to confusion. High risk for aspiration.  7. Vascular Access - has RIJ TDC and maturing L Cimino AVF + thrill.  8. AMS- presumably due to hypercalcemia follow clinically, CT scan negative. In restraint at present. Not following commands or responding to verbal stimuli.  9. Multiple Myeloma - missed today's cycle of velcade and decadron. Await Heme input for other possible therapies.  Donetta Potts, MD [place] Pager [phone]  07/08/2015, 8:34 AM

## 2015-07-09 ENCOUNTER — Telehealth: Payer: Self-pay | Admitting: *Deleted

## 2015-07-09 ENCOUNTER — Encounter: Payer: Self-pay | Admitting: Vascular Surgery

## 2015-07-09 LAB — RENAL FUNCTION PANEL
Albumin: 2.7 g/dL — ABNORMAL LOW (ref 3.5–5.0)
Anion gap: 11 (ref 5–15)
BUN: 41 mg/dL — AB (ref 6–20)
CALCIUM: 12.2 mg/dL — AB (ref 8.9–10.3)
CHLORIDE: 99 mmol/L — AB (ref 101–111)
CO2: 27 mmol/L (ref 22–32)
CREATININE: 6.9 mg/dL — AB (ref 0.61–1.24)
GFR, EST AFRICAN AMERICAN: 8 mL/min — AB (ref >60)
GFR, EST NON AFRICAN AMERICAN: 7 mL/min — AB (ref >60)
Glucose, Bld: 87 mg/dL (ref 65–99)
Phosphorus: 6.3 mg/dL — ABNORMAL HIGH (ref 2.5–4.6)
Potassium: 3.5 mmol/L (ref 3.5–5.1)
SODIUM: 137 mmol/L (ref 135–145)

## 2015-07-09 LAB — CBC
HCT: 22.1 % — ABNORMAL LOW (ref 39.0–52.0)
Hemoglobin: 7.7 g/dL — ABNORMAL LOW (ref 13.0–17.0)
MCH: 36.5 pg — ABNORMAL HIGH (ref 26.0–34.0)
MCHC: 34.8 g/dL (ref 30.0–36.0)
MCV: 104.7 fL — AB (ref 78.0–100.0)
PLATELETS: 65 10*3/uL — AB (ref 150–400)
RBC: 2.11 MIL/uL — AB (ref 4.22–5.81)
RDW: 14.2 % (ref 11.5–15.5)
WBC: 9 10*3/uL (ref 4.0–10.5)

## 2015-07-09 LAB — GLUCOSE, CAPILLARY
GLUCOSE-CAPILLARY: 85 mg/dL (ref 65–99)
Glucose-Capillary: 95 mg/dL (ref 65–99)
Glucose-Capillary: 93 mg/dL (ref 65–99)
Glucose-Capillary: 95 mg/dL (ref 65–99)

## 2015-07-09 LAB — HEMOGLOBIN A1C
HEMOGLOBIN A1C: 5.8 % — AB (ref 4.8–5.6)
MEAN PLASMA GLUCOSE: 120 mg/dL

## 2015-07-09 MED ORDER — POTASSIUM CHLORIDE CRYS ER 20 MEQ PO TBCR
30.0000 meq | EXTENDED_RELEASE_TABLET | Freq: Two times a day (BID) | ORAL | Status: AC
  Administered 2015-07-09 – 2015-07-10 (×4): 30 meq via ORAL
  Filled 2015-07-09 (×3): qty 2

## 2015-07-09 MED ORDER — CALCITONIN (SALMON) 200 UNIT/ML IJ SOLN
320.0000 [IU] | Freq: Two times a day (BID) | INTRAMUSCULAR | Status: DC
  Administered 2015-07-09 – 2015-07-11 (×6): 320 [IU] via SUBCUTANEOUS
  Filled 2015-07-09 (×11): qty 1.6

## 2015-07-09 MED ORDER — SODIUM CHLORIDE 0.9 % IV SOLN
30.0000 mg | Freq: Once | INTRAVENOUS | Status: AC
  Administered 2015-07-09: 30 mg via INTRAVENOUS
  Filled 2015-07-09: qty 10

## 2015-07-09 NOTE — Progress Notes (Signed)
PT Cancellation Note  Patient Details Name: NAIIM MACEDO MRN: [id number] DOB: [birth year]   Cancelled Treatment:    Reason Eval/Treat Not Completed: Patient at procedure or test/unavailable HD. PT to return as able.   Kingsley Callander 07/09/2015, 10:06 AM   Kittie Plater, PT, DPT Pager #: [phone] Office #: [phone]

## 2015-07-09 NOTE — Progress Notes (Signed)
Utilization Review Completed.Justin Woods T11/28/2016  

## 2015-07-09 NOTE — Progress Notes (Signed)
  [facility] Progress Note   Subjective: concerned about not being able to "talk right" this morning. Ca back up to 12.2 which corrects to 13.2.    Filed Vitals:   07/09/15 0730 07/09/15 0735 07/09/15 0800 07/09/15 0830  BP: 160/75 155/80 126/68 110/78  Pulse: 93 91 97 100  Temp: 98.3 F (36.8 C)     TempSrc: Oral     Resp: 19 19 17 19   Height:      Weight: 80.8 kg (178 lb 2.1 oz)     SpO2: 93%      Exam: No distress No jvd Chest clear bilat RRR no mrg Abd soft ntnd L Cimino AVF +bruit, R IJ cath No edema  [facility] MWF EDW 78kg HD Bath 2K/2.25Ca 4h  Heparin 2400. Access RIJ TDC w maturing L cimino AVF Micera 220mcg every 2 weeks     Assessment: 1 HyperCa++ myeloma +/- meds, worse today w symptoms (speech) 2 ESRD HD today 3 Vol stable 4 HTN amlod/ mtp 5 MBD off D/ Ca 6 AMS resolved 7 Myeloma on Velcade/ decadron 8 Anemia on esa, Hb 9's  Plan - give pamidronate for Ca^ and start calcitonin short-term, consider ONC input   Kelly Splinter MD [facility] pager [phone]    cell [phone] 07/09/2015, 9:02 AM    Recent Labs Lab 07/02/2015 2340 07/07/15 1000 07/08/15 0334 07/09/15 0501  NA 132* 137 138 137  K 5.1 3.6 3.3* 3.5  CL 96* 99* 99* 99*  CO2 23 28 31 27   GLUCOSE 169* 111* 86 87  BUN 44* 19 15 41*  CREATININE 10.31* 4.60* 3.53* 6.90*  CALCIUM 14.8* 11.0* 10.4* 12.2*  PHOS 8.2* 3.9  --  6.3*    Recent Labs Lab 06/23/2015 2340 07/07/15 1000 07/09/15 0501  ALBUMIN 3.1* 3.0* 2.7*    Recent Labs Lab 06/16/2015 1530 06/28/2015 2340 07/09/15 0501  WBC 17.2* 18.0* 9.0  HGB 9.3* 9.2* 7.7*  HCT 26.9* 26.0* 22.1*  MCV 105.5* 104.0* 104.7*  PLT 78* 68* 65*   . acyclovir  400 mg Oral BID  . amLODipine  5 mg Oral Daily  . budesonide-formoterol  2 puff Inhalation BID  . calcium carbonate  2,000 mg Oral TID WC  . fluticasone  1 spray Each Nare Daily  . gabapentin  100 mg Oral QHS  . heparin  5,000 Units Subcutaneous 3  times per day  . insulin aspart  0-9 Units Subcutaneous TID WC  . metoprolol succinate  25 mg Oral Daily  . pantoprazole  40 mg Oral Daily  . potassium chloride  30 mEq Oral BID  . sodium chloride  3 mL Intravenous Q12H     bisacodyl, hydrALAZINE, meclizine, oxyCODONE

## 2015-07-09 NOTE — Care Management Important Message (Signed)
Important Message  Patient Details  Name: Justin Woods MRN: [id number] Date of Birth: 06-11-1931   Medicare Important Message Given:  Ernesta Amble, RN 07/09/2015, 12:18 AM

## 2015-07-09 NOTE — Telephone Encounter (Signed)
Voicemail from patient's wife, [name] asking what to do about his chemotherapy treatments.  Currently admitted [id number]."  Called home and mobile numbers.  Message left advising he rest , get better and treatments can be rescheduled after he has healed from this event.

## 2015-07-09 NOTE — Progress Notes (Addendum)
[facility] PROGRESS NOTE  Justin Woods YQM:[id number] DOB: 05/01/1931 DOA: 07/07/2015 PCP: Wenda Low, MD  Assessment/Plan: 1. Severe hypercalcemia -Justin Woods is a 81 year old gentleman with a history of multiple myeloma and end-stage renal disease who requires hemodialysis. He presented with severe hypercalcemia having calcium greater than 15 undergoing hemodialysis overnight along with the administration of calcitonin. -Hypercalcemia likely related to multiple myeloma -On 07/08/2015 a.m. lab work showing calcium of 10.4 with improvement to mental status changes  -Repeat labs performed on 07/09/2015 show an upward trend in his calcium to 12.2. I spoke to both Dr. Earlie Server and Dr. Jonnie Finner. Dr. Earlie Server agreed with administrating IV pamidronate. Will continue holding Velcade for now until he is evaluated by oncology at the cancer center.   2.  Failure to thrive/functional decline/acute encephalopathy -Family members reporting progressive functional decline over the past 2 weeks characterize by mental status changes, confusion, generalized weakness, having falls at home. -Suspect that hypercalcemia is a significant contributor, however, it is likely that this is multifactorial. He has multiple comorbidities including multiple myeloma, end-stage renal disease on hemodialysis, hypertension. -This possible there is progression of multiple myeloma contributing to failure to thrive. This possibility was discussed with his medical oncologist as he'll be reevaluated in the [place] upon discharge.   3.  End-stage renal disease. -Patient currently on hemodialysis, was evaluated by nephrology during this hospitalization undergoing hemodialysis overnight. -Has history of multiple myeloma -Nephrology following, undergoing hemodialysis during this hospitalization   4.  Thrombocytopenia. -Labs revealing platelet count of 68,000, which could be secondary to velcade therapy -He does not  have evidence of active bleed -A.m. lab work on 07/09/2015 showing platelet count of  65,000  5.  Leukocytosis. -Presenting lab per showing white count of 17,200. -This is increased from 11,800 on 06/29/2015. -Initial workup not revealing obvious source of infection. He has been afebrile. -Blood cultures obtained on 07/07/2015 show no growth after overnight incubation  6.  Hypertension. -Blood pressures better controlled today, status post hemodialysis. -Continue amlodipine 5 mg by mouth daily, metoprolol 25 mg daily  7.  Status post fall -Likely related to functional decline and consequences of severe hypercalcemia, multiple myeloma, end-stage renal disease.  8.  Multiple myeloma -On 07/08/2015 I discussed case with Dr.Penland on medical oncology. We discussed his current treatment for multiple myeloma. She suggested holding Velcade therapy for now until he sees his primary oncologist Dr Julien Nordmann in the cancer center.    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 He is undergoing therapy with Velcade and Decadron. Patient had been receiving his care at the cancer center. He was brought to the emergency department on 07/04/2015 after having a fall at home. Family members reporting a progressive functional decline over the past 2 weeks, having spells of increased confusion. Initial lab work revealed severe hypercalcemia having calcium greater than 15. Nephrology was consulted as he was started on calcitonin along with hemodialysis. His calcium came down to 11 after hemodialysis.    Objective: Filed  Vitals:   07/09/15 1030 07/09/15 1100  BP: 135/69 132/59  Pulse: 97 100  Temp: 98.3 F (36.8 C) 98.6 F (37 C)  Resp: 21 20  Intake/Output Summary (Last 24 hours) at 07/09/15 1544 Last data filed at 07/09/15 1500  Gross per 24 hour  Intake    600 ml  Output    -70 ml  Net    670 ml   Filed Weights   07/08/15 2012 07/09/15 0730 07/09/15 1030  Weight: 80 kg (176 lb 5.9 oz) 80.8 kg (178 lb 2.1 oz) 80.9 kg (178 lb 5.6 oz)    Exam:   General:  Patient is being seen during HD, he reports feeling a little sick after his BP's dropped  Cardiovascular: Tachycardic, regular rate rhythm normal S1-S2 no murmurs or gallops  Respiratory: Normal respiratory effort, diminished breath sounds bilaterally  Abdomen: Soft nontender nondistended  Musculoskeletal: No rashes or lesions   Data Reviewed: Basic Metabolic Panel:  Recent Labs Lab 06/16/2015 1530 06/12/2015 2340 07/07/15 1000 07/08/15 0334 07/09/15 0501  NA 136 132* 137 138 137  K 5.1 5.1 3.6 3.3* 3.5  CL 98* 96* 99* 99* 99*  CO2 _0 GLUCOSE 158* 169* 111* 86 87  BUN 39* 44* 19 15 41*  CREATININE 9.57* 10.31* 4.60* 3.53* 6.90*  CALCIUM >15.0* 14.8* 11.0* 10.4* 12.2*  PHOS  --  8.2* 3.9  --  6.3*   Liver Function Tests:  Recent Labs Lab 06/26/2015 2340 07/07/15 1000 07/09/15 0501  ALBUMIN 3.1* 3.0* 2.7*   No results for input(s): LIPASE, AMYLASE in the last 168 hours. No results for input(s): AMMONIA in the last 168 hours. CBC:  Recent Labs Lab 06/29/2015 1530 06/16/2015 2340 07/09/15 0501  WBC 17.2* 18.0* 9.0  HGB 9.3* 9.2* 7.7*  HCT 26.9* 26.0* 22.1*  MCV 105.5* 104.0* 104.7*  PLT 78* 68* 65*   Cardiac Enzymes: No results for input(s): CKTOTAL, CKMB, CKMBINDEX, TROPONINI in the last 168 hours. BNP (last 3 results) No results for input(s): BNP in the last 8760 hours.  ProBNP (last 3 results) No results for input(s): PROBNP in the last 8760 hours.  CBG:  Recent Labs Lab 07/08/15 1139  07/08/15 1652 07/08/15 2141 07/09/15 0305 07/09/15 1337  GLUCAP 96 109* 94 85 95    Recent Results (from the past 240 hour(s))  Culture, blood (routine x 2)     Status: None (Preliminary result)   Collection Time: 07/07/15  4:34 PM  Result Value Ref Range Status   Specimen Description BLOOD URINE, CATHETERIZED  Final   Special Requests BOTTLES DRAWN AEROBIC AND ANAEROBIC 10CC  Final   Culture NO GROWTH 2 DAYS  Final   Report Status PENDING  Incomplete  Culture, blood (routine x 2)     Status: None (Preliminary result)   Collection Time: 07/07/15  5:20 PM  Result Value Ref Range Status   Specimen Description BLOOD HEMODIALYSIS CATHETER  Final   Special Requests BOTTLES DRAWN AEROBIC AND ANAEROBIC 10CC  Final   Culture NO GROWTH 2 DAYS  Final   Report Status PENDING  Incomplete     Studies: No results found.  Scheduled Meds: . acyclovir  400 mg Oral BID  . amLODipine  5 mg Oral Daily  . budesonide-formoterol  2 puff Inhalation BID  . calcitonin  320 Units Subcutaneous Q12H  . fluticasone  1 spray Each Nare Daily  . gabapentin  100 mg Oral QHS  . heparin  5,000 Units Subcutaneous 3 times per day  . insulin aspart  0-9 Units Subcutaneous TID WC  . metoprolol succinate  25 mg Oral Daily  . pamidronate  30 mg  Intravenous Once  . pantoprazole  40 mg Oral Daily  . potassium chloride  30 mEq Oral BID  . sodium chloride  3 mL Intravenous Q12H   Continuous Infusions:   Principal Problem:   Hypercalcemia of malignancy Active Problems:   End-stage renal disease on hemodialysis (HCC)   Essential hypertension   Multiple myeloma (HCC)   Leukocytosis   Anemia of chronic disease   Acute encephalopathy   Hypercalcemia    Time spent: 30 min    Kelvin Cellar  [facility] Pager [phone]. If 7PM-7AM, please contact night-coverage at [email or web address], password [facility] 07/09/2015, 3:44 PM  LOS: 3 days

## 2015-07-10 ENCOUNTER — Telehealth: Payer: Self-pay | Admitting: Medical Oncology

## 2015-07-10 ENCOUNTER — Inpatient Hospital Stay (HOSPITAL_COMMUNITY): Payer: PPO

## 2015-07-10 ENCOUNTER — Ambulatory Visit (HOSPITAL_COMMUNITY): Admission: RE | Admit: 2015-07-10 | Payer: PPO | Source: Ambulatory Visit

## 2015-07-10 LAB — CBC
HEMATOCRIT: 22.9 % — AB (ref 39.0–52.0)
HEMOGLOBIN: 7.9 g/dL — AB (ref 13.0–17.0)
MCH: 36.6 pg — ABNORMAL HIGH (ref 26.0–34.0)
MCHC: 34.5 g/dL (ref 30.0–36.0)
MCV: 106 fL — ABNORMAL HIGH (ref 78.0–100.0)
Platelets: 68 10*3/uL — ABNORMAL LOW (ref 150–400)
RBC: 2.16 MIL/uL — AB (ref 4.22–5.81)
RDW: 14 % (ref 11.5–15.5)
WBC: 10.7 10*3/uL — ABNORMAL HIGH (ref 4.0–10.5)

## 2015-07-10 LAB — BASIC METABOLIC PANEL
ANION GAP: 10 (ref 5–15)
BUN: 23 mg/dL — ABNORMAL HIGH (ref 6–20)
CO2: 28 mmol/L (ref 22–32)
Calcium: 10.5 mg/dL — ABNORMAL HIGH (ref 8.9–10.3)
Chloride: 102 mmol/L (ref 101–111)
Creatinine, Ser: 5.65 mg/dL — ABNORMAL HIGH (ref 0.61–1.24)
GFR calc Af Amer: 10 mL/min — ABNORMAL LOW (ref >60)
GFR, EST NON AFRICAN AMERICAN: 8 mL/min — AB (ref >60)
GLUCOSE: 99 mg/dL (ref 65–99)
POTASSIUM: 4.1 mmol/L (ref 3.5–5.1)
Sodium: 140 mmol/L (ref 135–145)

## 2015-07-10 LAB — GLUCOSE, CAPILLARY
GLUCOSE-CAPILLARY: 90 mg/dL (ref 65–99)
GLUCOSE-CAPILLARY: 113 mg/dL — AB (ref 65–99)
GLUCOSE-CAPILLARY: 102 mg/dL — AB (ref 65–99)
Glucose-Capillary: 92 mg/dL (ref 65–99)
Glucose-Capillary: 100 mg/dL — ABNORMAL HIGH (ref 65–99)

## 2015-07-10 MED ORDER — ONDANSETRON HCL 4 MG/2ML IJ SOLN
4.0000 mg | Freq: Four times a day (QID) | INTRAMUSCULAR | Status: DC | PRN
  Administered 2015-07-10 – 2015-07-12 (×2): 4 mg via INTRAVENOUS
  Filled 2015-07-10 (×2): qty 2

## 2015-07-10 MED ORDER — DARBEPOETIN ALFA 200 MCG/0.4ML IJ SOSY
200.0000 ug | PREFILLED_SYRINGE | INTRAMUSCULAR | Status: DC
  Administered 2015-07-11: 200 ug via INTRAVENOUS
  Filled 2015-07-10: qty 0.4

## 2015-07-10 MED ORDER — METOPROLOL SUCCINATE ER 50 MG PO TB24
50.0000 mg | ORAL_TABLET | Freq: Every day | ORAL | Status: DC
  Administered 2015-07-11 – 2015-07-13 (×3): 50 mg via ORAL
  Filled 2015-07-10 (×3): qty 1

## 2015-07-10 MED ORDER — METOPROLOL SUCCINATE ER 25 MG PO TB24
25.0000 mg | ORAL_TABLET | Freq: Every day | ORAL | Status: DC
  Administered 2015-07-10: 25 mg via ORAL
  Filled 2015-07-10: qty 1

## 2015-07-10 NOTE — Progress Notes (Signed)
Subjective:  Nausea vomiting with brk this am/ wife in rm.   Objective Vital signs in last 24 hours: Filed Vitals:   07/09/15 1100 07/09/15 1800 07/09/15 2100 07/10/15 0529  BP: 132/59 164/68 161/69 161/70  Pulse: 100 93 101 108  Temp: 98.6 F (37 C) 99.6 F (37.6 C) 98.1 F (36.7 C) 98 F (36.7 C)  TempSrc: Oral Oral Oral Oral  Resp: 20 20 16 16   Height:      Weight:   81 kg (178 lb 9.2 oz)   SpO2: 94% 94% 94% 92%   Weight change: 0.8 kg (1 lb 12.2 oz)  Physical Exam: General: alert NAD/ sl hard of hearing "needs hearing aide in" per wife Heart: RRR , no M/r/G Lungs: CTA Abdomen: bs pos. Soft ,some mild epigastric tenderness and Non distended  Extremities:  No pedal edema Dialysis Access: L FA AVF / R IJ perm cath     OP HD= [facility] MWF EDW 78kg HD Bath 2K/2.25Ca 4h Heparin 2400. Access RIJ TDC w maturing L cimino AVF Micera 234mcg every 2 weeks  Problem/Plan: 1 Hypercalcemia d/t myeloma primarily (+/- meds)- improving, sp pamidronate, started calcitonin 2 ESRD= HD on schedule MWF 3 Vol =stable3 kg over edw but bedwts 4 HTN amlod/ mtp 5 MBD off D/ Ca 6 AMS resolved 7 Myeloma  =Per Onc Dr. Raynelle Dick  holding on Velcade/ for now 8 Anemia on esa, Hb 9 on admit drifting down  7.9 was on Mircera 223mcg q 2weeks as op hd  Ernest Haber, PA-C [facility] Beeper [phone] 07/10/2015,8:59 AM  LOS: 4 days   Pt seen, examined and agree w A/P as above. Ca levels down, hopefully will stay down now after bisphosphonate Rx.   Kelly Splinter MD [facility] pager [phone]    cell [phone] 07/10/2015, 2:50 PM    Labs: Basic Metabolic Panel:  Recent Labs Lab 06/23/2015 2340 07/07/15 1000 07/08/15 0334 07/09/15 0501 07/10/15 0550  NA 132* 137 138 137 140  K 5.1 3.6 3.3* 3.5 4.1  CL 96* 99* 99* 99* 102  CO2 23 28 31 27 28   GLUCOSE 169* 111* 86 87 99  BUN 44* 19 15 41* 23*  CREATININE 10.31* 4.60* 3.53* 6.90* 5.65*  CALCIUM 14.8*  11.0* 10.4* 12.2* 10.5*  PHOS 8.2* 3.9  --  6.3*  --    Liver Function Tests:  Recent Labs Lab 06/30/2015 2340 07/07/15 1000 07/09/15 0501  ALBUMIN 3.1* 3.0* 2.7*   No results for input(s): LIPASE, AMYLASE in the last 168 hours. No results for input(s): AMMONIA in the last 168 hours. CBC:  Recent Labs Lab 07/02/2015 1530 06/29/2015 2340 07/09/15 0501 07/10/15 0550  WBC 17.2* 18.0* 9.0 10.7*  HGB 9.3* 9.2* 7.7* 7.9*  HCT 26.9* 26.0* 22.1* 22.9*  MCV 105.5* 104.0* 104.7* 106.0*  PLT 78* 68* 65* 68*   Cardiac Enzymes: No results for input(s): CKTOTAL, CKMB, CKMBINDEX, TROPONINI in the last 168 hours. CBG:  Recent Labs Lab 07/09/15 1337 07/09/15 1656 07/09/15 2101 07/10/15 0330 07/10/15 0752  GLUCAP 95 93 95 92 90    Studies/Results: No results found. Medications:   . acyclovir  400 mg Oral BID  . amLODipine  5 mg Oral Daily  . budesonide-formoterol  2 puff Inhalation BID  . calcitonin  320 Units Subcutaneous Q12H  . fluticasone  1 spray Each Nare Daily  . gabapentin  100 mg Oral QHS  . heparin  5,000 Units Subcutaneous 3 times per day  .  insulin aspart  0-9 Units Subcutaneous TID WC  . metoprolol succinate  25 mg Oral Daily  . pantoprazole  40 mg Oral Daily  . potassium chloride  30 mEq Oral BID  . sodium chloride  3 mL Intravenous Q12H

## 2015-07-10 NOTE — Progress Notes (Addendum)
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 He is undergoing therapy with Velcade and Decadron. Patient had been receiving his care at the cancer center. He was brought to the emergency department on 06/28/2015 after having a fall at home. Family members reporting a progressive functional decline over the past 2 weeks, having spells of increased confusion. Initial lab work revealed severe hypercalcemia having calcium greater than 15. Nephrology was consulted as he was started on calcitonin along with hemodialysis. His calcium came down after hemodialysis, however started trending back up a few days later for which Nephrology gave Pamidronate. Patient having worsening back pain, having h/o kyphoplasty for treatment of pathologic L2 fracture. IR recommending MRI of lower back.   Assessment/Plan: 1. Severe hypercalcemia -Justin Woods is a 81 year old gentleman with a history of multiple myeloma and end-stage renal disease who requires hemodialysis. He presented with severe hypercalcemia having calcium greater than 15 undergoing hemodialysis overnight along with the administration of calcitonin. -Hypercalcemia likely related to multiple myeloma -On 07/08/2015 a.m. lab work showing calcium of 10.4 with improvement to mental status changes  -Repeat labs performed on 07/09/2015 show an upward trend in his calcium to 12.2. I spoke to both Dr. Earlie Server and Dr. Jonnie Finner. Dr. Earlie Server agreed with administrating IV pamidronate. Will continue holding Velcade for now until he is evaluated by oncology at the cancer center.  -Labs on 07/10/2015 showing calcium at 10.5 Calcium level will need to be followed.   2.  Failure  to thrive/functional decline/acute encephalopathy -Family members reporting progressive functional decline over the past 2 weeks characterize by mental status changes, confusion, generalized weakness, having falls at home. -Suspect that hypercalcemia is a significant contributor, however, it is likely that this is multifactorial. He has multiple comorbidities including multiple myeloma, end-stage renal disease on hemodialysis, hypertension. -This possible there is progression of multiple myeloma contributing to failure to thrive. This possibility was discussed with his medical oncologist as hell be reevaluated in the [place] upon discharge.   3.  New onset back pain/lumbar compression fracture. -Patient having history of pathological fracture involving L2 undergoing kyphoplasty on 06/21/2015. -He was evaluated by interventional radiology who recommended obtaining MRI of lower back. -MRI performed on 07/10/2015 showing new compression fracture at L2 likely to be pathologic. Await further recommendations from interventional radiology.  4.  End-stage renal disease. -Patient currently on hemodialysis, was evaluated by nephrology during this hospitalization undergoing hemodialysis overnight. -Has history of multiple myeloma -Nephrology following, undergoing hemodialysis during this hospitalization   5.  Thrombocytopenia. -Labs revealing platelet count of 68,000, which could be secondary to velcade therapy -He does not have evidence of active bleed -A.m. lab work on 07/09/2015 showing platelet count of  68,000  6.  Leukocytosis. -Presenting lab per showing white count of 17,200. -This is increased from 11,800 on 06/29/2015. -Initial workup not revealing obvious source of infection. He has been afebrile. -Blood cultures obtained on 07/07/2015 show no growth after overnight incubation  7.   Hypertension. -Blood pressures elevated will increase his metoprolol to 50 mg by mouth  daily -Continue amlodipine at 5 mg by mouth daily  7.  Status post fall -Likely related to functional decline and consequences of severe hypercalcemia, multiple myeloma, end-stage renal disease.  8.  Multiple myeloma -I spoke to his primary oncologist Dr. Earlie Server on 07/01/2015 who recommended continue to hold Velcade until he is evaluated in the cancer center on hospital follow-up.  Code Status: Full code Family Communication: I spoke to his wife was present at bedside Disposition Plan: Monitoring calcium levels, having worsening back pain with MRI showing new lumbar compression fracture, awaiting recommendations from interventional radiology   Consultants:  Nephrology  Procedures Hemodialysis   HPI/Subjective:  Patient complains of increasing lower back pain. Had several episodes of nausea vomiting this morning  Objective: Filed Vitals:   07/10/15 0529 07/10/15 1030  BP: 161/70 184/62  Pulse: 108 103  Temp: 98 F (36.7 C) 98 F (36.7 C)  Resp: 16 18    Intake/Output Summary (Last 24 hours) at 07/10/15 1519 Last data filed at 07/10/15 1255  Gross per 24 hour  Intake    720 ml  Output      0 ml  Net    720 ml   Filed Weights   07/09/15 0730 07/09/15 1030 07/09/15 2100  Weight: 80.8 kg (178 lb 2.1 oz) 80.9 kg (178 lb 5.6 oz) 81 kg (178 lb 9.2 oz)    Exam:   General:  Ill-appearing, reports back pain.  Cardiovascular: Tachycardic, regular rate rhythm normal S1-S2 no murmurs or gallops  Respiratory: Normal respiratory effort, diminished breath sounds bilaterally  Abdomen: Soft nontender nondistended  Musculoskeletal: He has pain to his lower back with turning over in his bed, pain with palpation over lumbar spine  Data Reviewed: Basic Metabolic Panel:  Recent Labs Lab 06/13/2015 2340 07/07/15 1000 07/08/15 0334 07/09/15 0501 07/10/15 0550  NA 132* 137 138 137 140  K 5.1 3.6 3.3* 3.5 4.1  CL 96* 99* 99* 99* 102  CO2 $Re'23 28 31 27 28  'MvH$ GLUCOSE 169*  111* 86 87 99  BUN 44* 19 15 41* 23*  CREATININE 10.31* 4.60* 3.53* 6.90* 5.65*  CALCIUM 14.8* 11.0* 10.4* 12.2* 10.5*  PHOS 8.2* 3.9  --  6.3*  --    Liver Function Tests:  Recent Labs Lab 06/30/2015 2340 07/07/15 1000 07/09/15 0501  ALBUMIN 3.1* 3.0* 2.7*   No results for input(s): LIPASE, AMYLASE in the last 168 hours. No results for input(s): AMMONIA in the last 168 hours. CBC:  Recent Labs Lab 06/15/2015 1530 06/12/2015 2340 07/09/15 0501 07/10/15 0550  WBC 17.2* 18.0* 9.0 10.7*  HGB 9.3* 9.2* 7.7* 7.9*  HCT 26.9* 26.0* 22.1* 22.9*  MCV 105.5* 104.0* 104.7* 106.0*  PLT 78* 68* 65* 68*   Cardiac Enzymes: No results for input(s): CKTOTAL, CKMB, CKMBINDEX, TROPONINI in the last 168 hours. BNP (last 3 results) No results for input(s): BNP in the last 8760 hours.  ProBNP (last 3 results) No results for input(s): PROBNP in the last 8760 hours.  CBG:  Recent Labs Lab 07/09/15 1656 07/09/15 2101 07/10/15 0330 07/10/15 0752 07/10/15 1202  GLUCAP 93 95 92 90 100*    Recent Results (from the past 240 hour(s))  Culture, blood (routine x 2)     Status: None (Preliminary result)   Collection Time: 07/07/15  4:34 PM  Result Value Ref Range Status   Specimen Description BLOOD URINE, CATHETERIZED  Final   Special Requests BOTTLES DRAWN AEROBIC AND ANAEROBIC 10CC  Final   Culture NO GROWTH 3 DAYS  Final   Report Status PENDING  Incomplete  Culture, blood (routine x 2)     Status: None (Preliminary result)   Collection Time: 07/07/15  5:20  PM  Result Value Ref Range Status   Specimen Description BLOOD HEMODIALYSIS CATHETER  Final   Special Requests BOTTLES DRAWN AEROBIC AND ANAEROBIC 10CC  Final   Culture NO GROWTH 3 DAYS  Final   Report Status PENDING  Incomplete     Studies: No results found.  Scheduled Meds: . acyclovir  400 mg Oral BID  . amLODipine  5 mg Oral Daily  . budesonide-formoterol  2 puff Inhalation BID  . calcitonin  320 Units Subcutaneous Q12H   . [START ON 07/11/2015] darbepoetin (ARANESP) injection - DIALYSIS  200 mcg Intravenous Q Wed-HD  . fluticasone  1 spray Each Nare Daily  . gabapentin  100 mg Oral QHS  . heparin  5,000 Units Subcutaneous 3 times per day  . insulin aspart  0-9 Units Subcutaneous TID WC  . metoprolol succinate  25 mg Oral Daily  . pantoprazole  40 mg Oral Daily  . potassium chloride  30 mEq Oral BID  . sodium chloride  3 mL Intravenous Q12H   Continuous Infusions:   Principal Problem:   Hypercalcemia of malignancy Active Problems:   End-stage renal disease on hemodialysis (HCC)   Essential hypertension   Multiple myeloma (HCC)   Leukocytosis   Anemia of chronic disease   Acute encephalopathy   Hypercalcemia    Time spent: 30 min    Kelvin Cellar  [facility] Pager [phone]. If 7PM-7AM, please contact night-coverage at [email or web address], password [facility] 07/10/2015, 3:19 PM  LOS: 4 days

## 2015-07-10 NOTE — Telephone Encounter (Signed)
Will hold treatment until discharge and getting better.

## 2015-07-10 NOTE — Progress Notes (Signed)
Referring Physician(s): Tommie Ard (original referring provider)  Chief Complaint:  New onset low back pain after fall. S/P L2 ablation and KP to treat pathologic compression fracture due to multiple myeloma 06/21/2015  Subjective:  Justin Woods is c/o low back pain after a fall.  He was scheduled to have his f/u visit today.  His not a great historian and his wife helps today.  Apparently he fell a couple of days ago due to hypercalcemia.  His wife states he has been complaining of pain "in his bottom" and in his low back area.  She is worried he may have "messed up the surgery" by Dr. Estanislado Pandy  Justin Woods is difficult to assess because he will say he is hurting in a certain area, then denies any pain in the same area.  He does report that "it hurts to get up.... It hurts to try and walk".    His wife does report that PT got him up yesterday and he just sat in the chair.  Allergies: Review of patient's allergies indicates no known allergies.  Medications: Prior to Admission medications   Medication Sig Start Date End Date Taking? Authorizing Provider  acyclovir (ZOVIRAX) 400 MG tablet Take 1 tablet (400 mg total) by mouth 2 (two) times daily. 02/21/15  Yes Curt Bears, MD  amLODipine (NORVASC) 5 MG tablet Take 5 mg by mouth daily.   Yes Historical Provider, MD  bisacodyl (DULCOLAX) 5 MG EC tablet Take 1 tablet (5 mg total) by mouth daily as needed for moderate constipation. 02/27/15  Yes Curt Bears, MD  budesonide-formoterol [facility]) 80-4.5 MCG/ACT inhaler Inhale 2 puffs into the lungs 2 (two) times daily.   Yes Historical Provider, MD  calcium elemental as carbonate (TUMS ULTRA 1000) 400 MG chewable tablet Chew 2,000 mg by mouth 3 (three) times daily with meals. Using as a binder   Yes Historical Provider, MD  dexamethasone (DECADRON) 4 MG tablet Take 10 tablets (40 mg total) by mouth once a week. Patient taking differently: Take 40 mg by mouth once a week. On  Fridays before lunch 03/02/15  Yes Curt Bears, MD  fluticasone [facility]) 50 MCG/ACT nasal spray Place 1 spray into both nostrils daily.   Yes Historical Provider, MD  gabapentin (NEURONTIN) 100 MG capsule Take 1 capsule (100 mg total) by mouth at bedtime. 05/03/15  Yes Donika Keith Rake, DO  meclizine (ANTIVERT) 25 MG tablet Take 1 tablet (25 mg total) by mouth 3 (three) times daily as needed for dizziness. 03/05/15  Yes Julianne Rice, MD  metoprolol succinate (TOPROL-XL) 25 MG 24 hr tablet Take 25 mg by mouth daily.   Yes Historical Provider, MD  omeprazole (PRILOSEC) 40 MG capsule Take 40 mg by mouth daily.   Yes Historical Provider, MD  oxyCODONE (OXY IR/ROXICODONE) 5 MG immediate release tablet Take 0.5 tablets (2.5 mg total) by mouth every 6 (six) hours as needed for severe pain. Take 1/2 tablet (2.5 mg) by mouth daily at bedtime, may take 1/2 tablet (2.5 mg) every 4 hours as needed for pain 06/12/15  Yes Alvia Grove, PA-C  traMADol (ULTRAM) 50 MG tablet Take 50-100 mg by mouth 3 (three) times daily as needed (pain).   Yes Historical Provider, MD     Vital Signs: BP 184/62 mmHg  Pulse 103  Temp(Src) 98 F (36.7 C) (Oral)  Resp 18  Ht _0  (1.676 m)  Wt 178 lb 9.2 oz (81 kg)  BMI 28.84 kg/m2  SpO2 94%  Physical Exam  Psychiatric: His behavior is normal.     Awake and Alert Heart RRR Lungs CTAB Abdomen soft, NTND  Exam of his spine was difficult as he is unreliable.  He did not wince or complain with palpation of his any part of his spine, however when I asked him if it was tender in certain areas, he would say Yes.  He seemed to be tender in the entire lumbar area, from L1 down to he sacrum.  He denied pain in the Thoracic area and did not exhibit pain on palpation there.  Imaging: Dg Chest 2 View  06/25/2015  CLINICAL DATA:  Altered mental status. 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 Since surgery  patient is at increased confusion and weakness. Witnessed fall today. EXAM: CHEST  2 VIEW COMPARISON:  02/07/2015 FINDINGS: Right central venous dialysis catheter with lead tips over the cavoatrial junction and RA region. Shallow inspiration with atelectasis in the lung bases. Normal heart size and pulmonary vascularity. No focal airspace disease or consolidation. No pneumothorax. No blunting of costophrenic angles. Degenerative changes in the spine. IMPRESSION: Shallow inspiration with atelectasis in the lung bases. No focal consolidation. Electronically Signed   By: Lucienne Capers M.D.   On: 06/27/2015 18:22   Ct Head Wo Contrast  07/10/2015  CLINICAL DATA:  Altered mental status. EXAM: CT HEAD WITHOUT CONTRAST TECHNIQUE: Contiguous axial images were obtained from the base of the skull through the vertex without intravenous contrast. COMPARISON:  MRI brain 03/05/2015 FINDINGS: Ventricles, cisterns and other CSF spaces are within normal. There is no mass, mass effect, shift of midline structures or acute hemorrhage. Subtle chronic ischemic microvascular disease. Mild early basal ganglia calcification bilaterally. No evidence of acute infarction. Bilateral hearing aids. Remaining bones and soft tissues are within normal. IMPRESSION: No acute intracranial findings. Minimal chronic ischemic microvascular disease. Electronically Signed   By: Marin Olp M.D.   On: 07/08/2015 18:25    Labs:  CBC:  Recent Labs  06/25/2015 1530 07/07/2015 2340 07/09/15 0501 07/10/15 0550  WBC 17.2* 18.0* 9.0 10.7*  HGB 9.3* 9.2* 7.7* 7.9*  HCT 26.9* 26.0* 22.1* 22.9*  PLT 78* 68* 65* 68*    COAGS:  Recent Labs  02/02/15 0454 02/07/15 0307 03/05/15 0520 06/21/15 0830  INR 1.11 1.18 1.07 0.98  APTT  --   --  23* 26    BMP:  Recent Labs  07/07/15 1000 07/08/15 0334 07/09/15 0501 07/10/15 0550  NA 137 138 137 140  K 3.6 3.3* 3.5 4.1  CL 99* 99* 99* 102  CO2 _0 GLUCOSE 111* 86 87 99   BUN 19 15 41* 23*  CALCIUM 11.0* 10.4* 12.2* 10.5*  CREATININE 4.60* 3.53* 6.90* 5.65*  GFRNONAA 11* 15* 7* 8*  GFRAA 12* 17* 8* 10*    LIVER FUNCTION TESTS:  Recent Labs  06/08/15 1511 06/15/15 1405 06/22/15 1420 06/29/15 1427 06/21/2015 2340 07/07/15 1000 07/09/15 0501  BILITOT 0.65 0.75 0.59 0.83  --   --   --   AST 29 32 38* 39*  --   --   --   ALT _1 <9  --   --   --   ALKPHOS 93 111 128 109  --   --   --   PROT 6.0* 6.0* 6.3* 6.6  --   --   --   ALBUMIN 3.3* 3.0* 3.1* 3.4* 3.1* 3.0* 2.7*  Assessment and Plan:  New onset low back pain after a fall S/P L2 ablation and KP to treat pathologic compression fracture due to multiple myeloma 06/21/2015  Discussed with Dr. Estanislado Pandy  Will obtain MRI of the lumbar spine and include sacrum Will follow.  Signed: Murrell Redden PA-C 07/10/2015, 12:30 PM   I spent a total of 25 Minutes at the the patient's bedside AND on the patient's hospital floor or unit, greater than 50% of which was counseling/coordinating care for f/u after Ablation and KP and eval for new back pain.

## 2015-07-10 NOTE — Telephone Encounter (Signed)
I returned wife's call about appt.  He is in hospital at [facility] after a fall. I left a message and  told her to let us know when he is discharged and we will r/ appt.

## 2015-07-10 NOTE — Telephone Encounter (Signed)
Called again today at 0935.  Received voicemail again.

## 2015-07-10 NOTE — Consult Note (Signed)
   [facility] CM Inpatient Consult   07/10/2015  Justin Woods 1930-10-07 [id number]   Came to visit patient at bedside to speak with patient and wife. Justin Woods is resting. Justin Woods has been active with [facility] in the past but was discharged due goals being met at the time. Justin Woods agreeable to [facility] services again. Explained to her that [facility] will not interfere or replace services provided by home health. He will receive post hospital transition of care calls and will be evaluated for monthly home visits. Consents are already in media tab. Will request patient to be assigned to [place]. Made inpatient RNCM aware of visit.   Marthenia Rolling, MSN-Ed, RN,BSN [facility] Liaison [phone]

## 2015-07-10 NOTE — Progress Notes (Signed)
PT Cancellation Note  Patient Details Name: Justin Woods MRN: [id number] DOB: May 23, 1931   Cancelled Treatment:    Reason Eval/Treat Not Completed: Patient not medically ready . Wife requested PT to come back as pt with severe back pain and awaiting MRI. RN confirmed, unsure when MRI will be completed.  Kingsley Callander 07/10/2015, 12:19 PM   Kittie Plater, PT, DPT Pager #: [phone] Office #: [phone]

## 2015-07-10 NOTE — Care Management Note (Signed)
Case Management Note  Patient Details  Name: Justin Woods MRN: [id number] Date of Birth: 1931/02/02  Subjective/Objective:   CM following for progression and d/c planning.                 Action/Plan: Noted that CM has met with this pt and wife on 07/08/2015 and [facility] services have been arranged with [facility] per pt choice on 07/08/2015. Will notify [facility] of pending d/c for services to begin.   Expected Discharge Date:                  Expected Discharge Plan:  [place]  In-House Referral:     Discharge planning Services  CM Consult  Post Acute Care Choice:    Choice offered to:  Patient  DME Arranged:    DME Agency:  [place] Arranged:  RN, PT, OT, Speech Therapy [place] Agency:  [place]  Status of Service:  Completed, signed off  Medicare Important Message Given:  Yes Date Medicare IM Given:    Medicare IM give by:    Date Additional Medicare IM Given:    Additional Medicare Important Message give by:     If discussed at [place] of Stay Meetings, dates discussed:    Additional Comments:  Adron Bene, RN 07/10/2015, 12:19 PM

## 2015-07-11 ENCOUNTER — Encounter: Payer: PPO | Admitting: Vascular Surgery

## 2015-07-11 DIAGNOSIS — C9000 Multiple myeloma not having achieved remission: Principal | ICD-10-CM

## 2015-07-11 DIAGNOSIS — I10 Essential (primary) hypertension: Secondary | ICD-10-CM

## 2015-07-11 DIAGNOSIS — N186 End stage renal disease: Secondary | ICD-10-CM

## 2015-07-11 DIAGNOSIS — Z992 Dependence on renal dialysis: Secondary | ICD-10-CM

## 2015-07-11 DIAGNOSIS — D638 Anemia in other chronic diseases classified elsewhere: Secondary | ICD-10-CM

## 2015-07-11 DIAGNOSIS — G9340 Encephalopathy, unspecified: Secondary | ICD-10-CM

## 2015-07-11 LAB — GLUCOSE, CAPILLARY
GLUCOSE-CAPILLARY: 103 mg/dL — AB (ref 65–99)
GLUCOSE-CAPILLARY: 100 mg/dL — AB (ref 65–99)
Glucose-Capillary: 123 mg/dL — ABNORMAL HIGH (ref 65–99)
Glucose-Capillary: 101 mg/dL — ABNORMAL HIGH (ref 65–99)
Glucose-Capillary: 96 mg/dL (ref 65–99)

## 2015-07-11 LAB — BASIC METABOLIC PANEL
ANION GAP: 11 (ref 5–15)
BUN: 42 mg/dL — ABNORMAL HIGH (ref 6–20)
CALCIUM: 11.7 mg/dL — AB (ref 8.9–10.3)
CHLORIDE: 100 mmol/L — AB (ref 101–111)
CO2: 28 mmol/L (ref 22–32)
Creatinine, Ser: 8.23 mg/dL — ABNORMAL HIGH (ref 0.61–1.24)
GFR calc non Af Amer: 5 mL/min — ABNORMAL LOW (ref >60)
GFR, EST AFRICAN AMERICAN: 6 mL/min — AB (ref >60)
GLUCOSE: 103 mg/dL — AB (ref 65–99)
POTASSIUM: 5.3 mmol/L — AB (ref 3.5–5.1)
Sodium: 139 mmol/L (ref 135–145)

## 2015-07-11 LAB — CBC
HEMATOCRIT: 22.4 % — AB (ref 39.0–52.0)
Hemoglobin: 7.7 g/dL — ABNORMAL LOW (ref 13.0–17.0)
MCH: 36.3 pg — AB (ref 26.0–34.0)
MCHC: 34.4 g/dL (ref 30.0–36.0)
MCV: 105.7 fL — ABNORMAL HIGH (ref 78.0–100.0)
Platelets: 64 10*3/uL — ABNORMAL LOW (ref 150–400)
RBC: 2.12 MIL/uL — AB (ref 4.22–5.81)
RDW: 14 % (ref 11.5–15.5)
WBC: 11.9 10*3/uL — ABNORMAL HIGH (ref 4.0–10.5)

## 2015-07-11 LAB — SURGICAL PCR SCREEN
MRSA, PCR: NEGATIVE
STAPHYLOCOCCUS AUREUS: NEGATIVE

## 2015-07-11 LAB — PROTIME-INR
INR: 1.15 (ref 0.00–1.49)
PROTHROMBIN TIME: 14.8 s (ref 11.6–15.2)

## 2015-07-11 MED ORDER — DARBEPOETIN ALFA 200 MCG/0.4ML IJ SOSY
PREFILLED_SYRINGE | INTRAMUSCULAR | Status: AC
  Filled 2015-07-11: qty 0.4

## 2015-07-11 MED ORDER — NEPRO/CARBSTEADY PO LIQD
237.0000 mL | Freq: Three times a day (TID) | ORAL | Status: DC
  Administered 2015-07-11 – 2015-07-13 (×4): 237 mL via ORAL
  Filled 2015-07-11 (×2): qty 237

## 2015-07-11 MED ORDER — AMLODIPINE BESYLATE 10 MG PO TABS
10.0000 mg | ORAL_TABLET | Freq: Every day | ORAL | Status: DC
  Administered 2015-07-12 – 2015-07-13 (×2): 10 mg via ORAL
  Filled 2015-07-11 (×2): qty 1

## 2015-07-11 NOTE — Progress Notes (Signed)
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 He is undergoing therapy with Velcade and Decadron. Patient had been receiving his care at the cancer center. He was brought to the emergency department on 06/13/2015 after having a fall at home. Family members reporting a progressive functional decline over the past 2 weeks, having spells of increased confusion. Initial lab work revealed severe hypercalcemia having calcium greater than 15. Nephrology was consulted as he was started on calcitonin along with hemodialysis. His calcium came down after hemodialysis, however started trending back up a few days later for which Nephrology gave Pamidronate. Patient having worsening back pain, having h/o kyphoplasty for treatment of pathologic L2 fracture. IR recommended MRI of lower back.   Assessment/Plan: 1. Severe hypercalcemia -Mr [name] is a 82 year old gentleman with a history of multiple myeloma and end-stage renal disease who requires hemodialysis. He presented with severe hypercalcemia having calcium greater than 15 -improving, given IV pamidronate on 11/28 -on SQ calcitonin. -Total calcium was 10.6 yesterday, now improving -Dr/Zamora, d/w both Dr. Earlie Server and Dr. Jonnie Finner. Dr. Earlie Server agreed with administrating IV pamidronate. Will continue holding Velcade for now until he is evaluated by oncology at the cancer center.  -Will follow-up I Cal day  2.  Failure to thrive/functional decline/acute encephalopathy -Family members reporting progressive functional decline over the past 2 weeks characterize by mental status changes, confusion, generalized weakness, having falls at home. -Suspect that  hypercalcemia is a significant contributor, however, it is likely that this is multifactorial. He has multiple comorbidities including multiple myeloma, end-stage renal disease on hemodialysis, hypertension. -Progression of multiple myeloma noted on MRI of LS-spine  -Appears to have poor prognosis , discussed with Dr.Schertz, he discussed with wife and family regarding poor prognosis, family requesting Dr.Mohamed's input, will request this today  3.  New onset back pain/lumbar compression fracture/PRogressive Myeloma. -recent history of pathological fracture involving L2 s/p kyphoplasty on 06/21/2015. -He was evaluated by interventional radiology, recommended MRI shows new compression fracture at L2 likely to be pathologic and progression of myeloma. Await further recommendations from interventional radiology.  4.  End-stage renal disease. -Nephrology following, undergoing hemodialysis during this hospitalization  -greatly appreciate Renal input  5.  Thrombocytopenia. -Labs revealing platelet count of 68,000, which could be secondary to velcade therapy, myeloma  6.  Leukocytosis. -Presenting lab per showing white count of 17,200, improving -now 11.9K -Initial workup not revealing obvious source of infection. He has been afebrile. -Blood cultures obtained on 07/07/2015 show no growth after overnight incubation  7.   Hypertension. -Blood pressures elevated, increased metoprolol to 50 mg by mouth daily -increase amlodipine to 48mh  8.  Status post fall -Likely related to functional decline and consequences of severe hypercalcemia, multiple myeloma, end-stage renal disease.  9. Nausea and Vomiting -due to pamidronate, 50% chance of GI sideeffects supportive care   Code Status: Full code Family Communication:  wife at bedside Disposition Plan: to be determined  Consultants:  Nephrology  Procedures Hemodialysis   HPI/Subjective: Had vomiting, multiple episodes yesterday,  continues to complain of low back pain  Objective: Filed Vitals:   07/11/15 0525 07/11/15 0846  BP: 150/78 173/76  Pulse: 96 99  Temp: 98.8 F (37.1 C) 98.2 F (36.8 C)  Resp: 18 17  Intake/Output Summary (Last 24 hours) at 07/11/15 1204 Last data filed at 07/11/15 0957  Gross per 24 hour  Intake    420 ml  Output    200 ml  Net    220 ml   Filed Weights   07/09/15 1030 07/09/15 2100 07/10/15 2130  Weight: 80.9 kg (178 lb 5.6 oz) 81 kg (178 lb 9.2 oz) 82 kg (180 lb 12.4 oz)    Exam:   General:  Ill-appearing, AAOx2, uncomfortable  Cardiovascular: Tachycardic, regular rate rhythm normal S1-S2 no murmurs or gallops  Respiratory: Normal respiratory effort, diminished breath sounds bilaterally  Abdomen: Soft nontender nondistended  Musculoskeletal: moves all extremities, leg movements, limited by low back pain  Data Reviewed: Basic Metabolic Panel:  Recent Labs Lab 06/30/2015 2340 07/07/15 1000 07/08/15 0334 07/09/15 0501 07/10/15 0550 07/11/15 0657  NA 132* 137 138 137 140 139  K 5.1 3.6 3.3* 3.5 4.1 5.3*  CL 96* 99* 99* 99* 102 100*  CO2 $Re'23 28 31 27 28 28  'bRg$ GLUCOSE 169* 111* 86 87 99 103*  BUN 44* 19 15 41* 23* 42*  CREATININE 10.31* 4.60* 3.53* 6.90* 5.65* 8.23*  CALCIUM 14.8* 11.0* 10.4* 12.2* 10.5* 11.7*  PHOS 8.2* 3.9  --  6.3*  --   --    Liver Function Tests:  Recent Labs Lab 06/21/2015 2340 07/07/15 1000 07/09/15 0501  ALBUMIN 3.1* 3.0* 2.7*   No results for input(s): LIPASE, AMYLASE in the last 168 hours. No results for input(s): AMMONIA in the last 168 hours. CBC:  Recent Labs Lab 06/25/2015 1530 07/09/2015 2340 07/09/15 0501 07/10/15 0550 07/11/15 0657  WBC 17.2* 18.0* 9.0 10.7* 11.9*  HGB 9.3* 9.2* 7.7* 7.9* 7.7*  HCT 26.9* 26.0* 22.1* 22.9* 22.4*  MCV 105.5* 104.0* 104.7* 106.0* 105.7*  PLT 78* 68* 65* 68* 64*   Cardiac Enzymes: No results for input(s): CKTOTAL, CKMB, CKMBINDEX, TROPONINI in the last 168 hours. BNP (last 3  results) No results for input(s): BNP in the last 8760 hours.  ProBNP (last 3 results) No results for input(s): PROBNP in the last 8760 hours.  CBG:  Recent Labs Lab 07/10/15 1717 07/10/15 2126 07/11/15 0428 07/11/15 0722 07/11/15 1131  GLUCAP 113* 102* 103* 100* 123*    Recent Results (from the past 240 hour(s))  Culture, blood (routine x 2)     Status: None (Preliminary result)   Collection Time: 07/07/15  4:34 PM  Result Value Ref Range Status   Specimen Description BLOOD URINE, CATHETERIZED  Final   Special Requests BOTTLES DRAWN AEROBIC AND ANAEROBIC 10CC  Final   Culture NO GROWTH 3 DAYS  Final   Report Status PENDING  Incomplete  Culture, blood (routine x 2)     Status: None (Preliminary result)   Collection Time: 07/07/15  5:20 PM  Result Value Ref Range Status   Specimen Description BLOOD HEMODIALYSIS CATHETER  Final   Special Requests BOTTLES DRAWN AEROBIC AND ANAEROBIC 10CC  Final   Culture NO GROWTH 3 DAYS  Final   Report Status PENDING  Incomplete  Surgical pcr screen     Status: None   Collection Time: 07/11/15  6:09 AM  Result Value Ref Range Status   MRSA, PCR NEGATIVE NEGATIVE Final   Staphylococcus aureus NEGATIVE NEGATIVE Final    Comment:        The Xpert SA Assay (FDA approved for NASAL specimens in patients over 45 years of age), is one component of a comprehensive surveillance program.  Test performance  has been validated by [facility] for patients greater than or equal to 52 year old. It is not intended to diagnose infection nor to guide or monitor treatment.      Studies: Mr Lumbar Spine Wo Contrast  07/10/2015  CLINICAL DATA:  81 year old male with multiple myeloma. Vertebroplasty 1 month ago. Past 2 weeks with increasing decline with falling at home. Interventional radiology requested MR to evaluate sacral for possible sacral vertebroplasty. Hemodialysis. Hypertension. Subsequent encounter. EXAM: MRI LUMBAR SPINE WITHOUT CONTRAST  TECHNIQUE: Multiplanar, multisequence MR imaging of the lumbar spine was performed. No intravenous contrast was administered. COMPARISON:  06/09/2015 lumbar spine MR. FINDINGS: Last fully open disk space is labeled L5-S1. Present examination incorporates from T11-12 disc space through lower sacrum. Diffuse altered signal intensity throughout all visualized bone marrow some which may represent anemia where as several T2 bright areas throughout visualized bone marrow are noted and consistent with myeloma involvement. When compared to prior examination, the degree of myeloma involvement has progressed including: Right L1 pedicle facet junction now measuring 1.3 cm versus prior 0.9 cm. L3 vertebral body involvement with new mild superior endplate compression fracture with 10-15% loss height. Diffuse involvement of the right sacrum now spanning over 4.8 cm versus prior 2.3 cm. Additionally, expansile destructive lesion with pathologic fracture right ilium once again noted measuring 4.7 x 4.6 cm and not completely imaged previously. Interval cement augmentation L2 and L4 vertebral body. Further slight loss of height L2 vertebral body (inferior endplate) with 70% loss of height centrally. Degenerative changes including: L1-2: Facet bony overgrowth. Minimal bulge. Very mild spinal stenosis. L2-3: Moderate facet degenerative changes and ligamentum flavum hypertrophy. Bulge slightly greater right lateral position. Short pedicles. Multifactorial moderate spinal stenosis. L3-4: Facet degenerative changes and ligamentum flavum hypertrophy greater on the left. Bulge. Short pedicles. Multifactorial moderate spinal stenosis slightly greater on left. L4-5: Facet bony overgrowth and ligamentum flavum hypertrophy. Prior right hemilaminectomy. Bulge and spur. Multifactorial marked bilateral lateral recess stenosis, moderate spinal stenosis and mild to moderate foraminal narrowing. L5-S1: Disc osteophyte complex with greatest extension  right lateral position. Prominent right foraminal narrowing and encroachment upon exiting right L5 nerve root. Mild left foraminal narrowing. Marked lateral recess stenosis greater right. Mild spinal stenosis. IMPRESSION: When compared to prior examination, the degree of myeloma involvement has progressed including: Right L1 pedicle facet junction now measuring 1.3 cm versus prior 0.9 cm. L3 vertebral body involvement with new mild superior endplate compression fracture with 10-15% loss height. Diffuse involvement of the right sacrum now spanning over 4.8 cm versus prior 2.3 cm. Expansile destructive lesion with pathologic fracture right ilium once again noted measuring 4.7 x 4.6 cm and not completely imaged previously. Interval cement augmentation L2 and L4 vertebral body. Further slight loss of height L2 vertebral body (inferior endplate) with 70% loss of height centrally. Degenerative changes including: L1-2 very mild spinal stenosis. L2-3 multifactorial moderate spinal stenosis. L3-4 multifactorial moderate spinal stenosis slightly greater on left. L4-5 multifactorial marked bilateral lateral recess stenosis, moderate spinal stenosis and mild to moderate foraminal narrowing. L5-S1 multifactorial marked right foraminal narrowing and encroachment upon exiting right L5 nerve root. Mild left foraminal narrowing. Marked lateral recess stenosis greater right. Mild spinal stenosis. Electronically Signed   By: Genia Del M.D.   On: 07/10/2015 16:02    Scheduled Meds: . acyclovir  400 mg Oral BID  . amLODipine  5 mg Oral Daily  . budesonide-formoterol  2 puff Inhalation BID  . calcitonin  320 Units Subcutaneous Q12H  .  darbepoetin (ARANESP) injection - DIALYSIS  200 mcg Intravenous Q Wed-HD  . fluticasone  1 spray Each Nare Daily  . gabapentin  100 mg Oral QHS  . heparin  5,000 Units Subcutaneous 3 times per day  . insulin aspart  0-9 Units Subcutaneous TID WC  . metoprolol succinate  50 mg Oral Daily  .  pantoprazole  40 mg Oral Daily  . sodium chloride  3 mL Intravenous Q12H   Continuous Infusions:   Principal Problem:   Hypercalcemia of malignancy Active Problems:   End-stage renal disease on hemodialysis (HCC)   Essential hypertension   Multiple myeloma (HCC)   Leukocytosis   Anemia of chronic disease   Acute encephalopathy   Hypercalcemia    Time spent: 30 min    Dejour Vos  [facility] Pager [phone] If 7PM-7AM, please contact night-coverage at [email or web address], password [facility] 07/11/2015, 12:04 PM  LOS: 5 days

## 2015-07-11 NOTE — Progress Notes (Signed)
Initial Nutrition Assessment  DOCUMENTATION CODES:   Not applicable  INTERVENTION:  Provide Nepro Shake po TID, each supplement provides 425 kcal and 19 grams protein.  Encourage adequate PO intake.   NUTRITION DIAGNOSIS:   Increased nutrient needs related to chronic illness as evidenced by estimated needs.  GOAL:   Patient will meet greater than or equal to 90% of their needs  MONITOR:   PO intake, Supplement acceptance, Weight trends, Labs, I & O's  REASON FOR ASSESSMENT:    (Poor PO)    ASSESSMENT:   81 year old gentleman with a history of multiple myeloma and end-stage renal disease who requires hemodialysis. He presented with severe hypercalcemia having calcium greater than 15 undergoing hemodialysis overnight along with the administration of calcitonin.   RD contacted via RN regarding patient with poor po intake due to n/v. Meal completion has been 0-100% since admission, however yesterday and today, pt has been having n/v with 0-25% meal completion. Wife at bedside reports he usually eats well at home with at least 3 meals daily. Weight has been stable. An Ensure shake was given to patient at bedside. Wife is agreeable to nutritional supplements for the patient as intake has been poor. RD to order. Pt with no observed significant fat or muscle mass loss.   Potassium elevated at 5.3.  Diet Order:  Diet renal/carb modified with fluid restriction Diet-HS Snack?: Nothing; Room service appropriate?: Yes; Fluid consistency:: Thin  Skin:   (Incision mid back)  Last BM:  11/28  Height:   Ht Readings from Last 1 Encounters:  06/29/2015 $RemoveB'5\' 6"'[id number]$  (1.676 m)    Weight:   Wt Readings from Last 1 Encounters:  07/10/15 180 lb 12.4 oz (82 kg)    Ideal Body Weight:  64.5 kg  BMI:  Body mass index is 29.19 kg/(m^2).  Estimated Nutritional Needs:   Kcal:  3354-5625  Protein:  95-105 grams  Fluid:  1.2 L/day  EDUCATION NEEDS:   No education needs identified at this  time  Corrin Parker, MS, RD, LDN Pager # [phone] After hours/ weekend pager # [phone]

## 2015-07-11 NOTE — Progress Notes (Signed)
Subjective: The patient is seen and examined during his hemodialysis today. He is feeling a little bit better but continues to have fatigue and weakness. He was admitted to [facility] after referral at home. During his evaluation he was found to have significant hypercalcemia. The patient was treated with IV hydration as well as pamidronate. He has end-stage disease as well as stage III multiple myeloma currently on treatment with subcutaneous Velcade and Decadron. Recent imaging studies including MRI of the lumbar spine showed evidence for progression of myeloma lesions in the bone. His last protein study in September showed no significant progression of his disease. A request by the hospitalist for consult today to discuss with the patient and his family his prognosis.   Objective: Vital signs in last 24 hours: Temp:  [98.2 F (36.8 C)-99 F (37.2 C)] 98.4 F (36.9 C) (11/30 1315) Pulse Rate:  [90-103] 99 (11/30 1528) Resp:  [16-20] 20 (11/30 1528) BP: (91-173)/(61-81) 121/68 mmHg (11/30 1528) SpO2:  [91 %-97 %] 97 % (11/30 1315) Weight:  [180 lb 12.4 oz (82 kg)] 180 lb 12.4 oz (82 kg) (11/29 2130)  Intake/Output from previous day: 11/29 0701 - 11/30 0700 In: 56 [P.O.:660] Out: 0  Intake/Output this shift: Total I/O In: 120 [P.O.:120] Out: 200 [Urine:200]  General appearance: alert, cooperative, fatigued and no distress Resp: clear to auscultation bilaterally Cardio: regular rate and rhythm, S1, S2 normal, no murmur, click, rub or gallop GI: soft, non-tender; bowel sounds normal; no masses,  no organomegaly Extremities: extremities normal, atraumatic, no cyanosis or edema  Lab Results:   Recent Labs  07/10/15 0550 07/11/15 0657  WBC 10.7* 11.9*  HGB 7.9* 7.7*  HCT 22.9* 22.4*  PLT 68* 64*   BMET  Recent Labs  07/10/15 0550 07/11/15 0657  NA 140 139  K 4.1 5.3*  CL 102 100*  CO2 28 28  GLUCOSE 99 103*  BUN 23* 42*  CREATININE 5.65* 8.23*  CALCIUM 10.5*  11.7*    Studies/Results: Mr Lumbar Spine Wo Contrast  07/10/2015  CLINICAL DATA:  81 year old male with multiple myeloma. Vertebroplasty 1 month ago. Past 2 weeks with increasing decline with falling at home. Interventional radiology requested MR to evaluate sacral for possible sacral vertebroplasty. Hemodialysis. Hypertension. Subsequent encounter. EXAM: MRI LUMBAR SPINE WITHOUT CONTRAST TECHNIQUE: Multiplanar, multisequence MR imaging of the lumbar spine was performed. No intravenous contrast was administered. COMPARISON:  06/09/2015 lumbar spine MR. FINDINGS: Last fully open disk space is labeled L5-S1. Present examination incorporates from T11-12 disc space through lower sacrum. Diffuse altered signal intensity throughout all visualized bone marrow some which may represent anemia where as several T2 bright areas throughout visualized bone marrow are noted and consistent with myeloma involvement. When compared to prior examination, the degree of myeloma involvement has progressed including: Right L1 pedicle facet junction now measuring 1.3 cm versus prior 0.9 cm. L3 vertebral body involvement with new mild superior endplate compression fracture with 10-15% loss height. Diffuse involvement of the right sacrum now spanning over 4.8 cm versus prior 2.3 cm. Additionally, expansile destructive lesion with pathologic fracture right ilium once again noted measuring 4.7 x 4.6 cm and not completely imaged previously. Interval cement augmentation L2 and L4 vertebral body. Further slight loss of height L2 vertebral body (inferior endplate) with 70% loss of height centrally. Degenerative changes including: L1-2: Facet bony overgrowth. Minimal bulge. Very mild spinal stenosis. L2-3: Moderate facet degenerative changes and ligamentum flavum hypertrophy. Bulge slightly greater right lateral position. Short pedicles. Multifactorial  moderate spinal stenosis. L3-4: Facet degenerative changes and ligamentum flavum  hypertrophy greater on the left. Bulge. Short pedicles. Multifactorial moderate spinal stenosis slightly greater on left. L4-5: Facet bony overgrowth and ligamentum flavum hypertrophy. Prior right hemilaminectomy. Bulge and spur. Multifactorial marked bilateral lateral recess stenosis, moderate spinal stenosis and mild to moderate foraminal narrowing. L5-S1: Disc osteophyte complex with greatest extension right lateral position. Prominent right foraminal narrowing and encroachment upon exiting right L5 nerve root. Mild left foraminal narrowing. Marked lateral recess stenosis greater right. Mild spinal stenosis. IMPRESSION: When compared to prior examination, the degree of myeloma involvement has progressed including: Right L1 pedicle facet junction now measuring 1.3 cm versus prior 0.9 cm. L3 vertebral body involvement with new mild superior endplate compression fracture with 10-15% loss height. Diffuse involvement of the right sacrum now spanning over 4.8 cm versus prior 2.3 cm. Expansile destructive lesion with pathologic fracture right ilium once again noted measuring 4.7 x 4.6 cm and not completely imaged previously. Interval cement augmentation L2 and L4 vertebral body. Further slight loss of height L2 vertebral body (inferior endplate) with 70% loss of height centrally. Degenerative changes including: L1-2 very mild spinal stenosis. L2-3 multifactorial moderate spinal stenosis. L3-4 multifactorial moderate spinal stenosis slightly greater on left. L4-5 multifactorial marked bilateral lateral recess stenosis, moderate spinal stenosis and mild to moderate foraminal narrowing. L5-S1 multifactorial marked right foraminal narrowing and encroachment upon exiting right L5 nerve root. Mild left foraminal narrowing. Marked lateral recess stenosis greater right. Mild spinal stenosis. Electronically Signed   By: Genia Del M.D.   On: 07/10/2015 16:02    Medications: I have reviewed the patient's current  medications.  Assessment/Plan: 1) End stage multiple myeloma in a patient with end-stage renal disease currently on hemodialysis. He is currently undergoing treatment with subcutaneous Velcade and Decadron. There is evidence for disease progression on the recent imaging studies. I had a lengthy discussion with the patient and his wife today about his current condition and treatment options. I explained to the patient that his treatment is of palliative nature and the probability of cure in his condition is very slim. The patient also has many other complicating factors including his end-stage renal disease. I strongly recommended for the patient to consider palliative care and hospice but I will not deny his wishes if he decided to continue with further treatment. His daughter was supposed to be available for the discussion but she did not show up for the meeting as a scheduled. The patient would like to have more discussion with his family before making a final decision. His expected survival is very short if he discontinue dialysis. 2) end-stage renal disease: Continue current care per nephrology.  3) anemia of chronic disease: Consider PRBCs transfusion if his hemoglobin is less than 8.0 g/dL. 4) hypercalcemia of malignancy: Better after IV hydration and treatment with pamidronate. Thank you so much for taking good care of Mr. Zalewski. Please call if you have any questions.     LOS: 5 days    Linzie Boursiquot K. 07/11/2015

## 2015-07-11 NOTE — Progress Notes (Signed)
Subjective:  MRI shows significant worsening of tumor infiltration lumbar spine and sacrum  Objective Vital signs in last 24 hours: Filed Vitals:   07/10/15 1921 07/10/15 2130 07/11/15 0525 07/11/15 0755  BP:  162/68 150/78   Pulse:  96 96   Temp:  98.8 F (37.1 C) 98.8 F (37.1 C)   TempSrc:  Oral Oral   Resp:  16 18   Height:      Weight:  82 kg (180 lb 12.4 oz)    SpO2: 91% 92% 92% 92%   Weight change: 1.2 kg (2 lb 10.3 oz) Physical Exam: General: alert NAD/ sl hard of hearing better than yest. Has hearing aide Heart: RRR , no M/r/G Lungs: CTA Abdomen: bs pos. Soft nontenderand Non distended  Extremities: no pedal edema Dialysis Access: L FA AVF pos bruit  / R IJ perm cath   OP HD= [facility] MWF EDW 78kg HD Bath 2K/2.25Ca 4h Heparin 2400. Access RIJ TDC w maturing L cimino AVF Micera every 2 weeks  Assessment: 1 Hypercalcemia d/t myeloma primarily (+/- meds)- sp pamidronate, started calcitonin. Ca worse today in spite of bisphosphonate, calcitonin and low Ca HD bath. MRI showing worsening tumor disease of lumbar spine and sacrum 2 ESRD  3 Vol up 3-4 kg 4 HTN amlod/ mtp 150/70 bp  5 MBD off  Vit D/ Ca binder 6 AMS still lethargic 7 Myeloma with hx comp fractures sp kyphoplasty 8 Anemia on esa 9 Back pain - major issue, compression fx's and ++spinal tumor burden  Plan - HD today.  Pt's wife is very concerned about declining QOL and is leaning towards palliative care approach (vs continued aggressive care w HD, chemo, etc). Have discussed options briefly with patient who is in and out, somnolent. Family have requested oncology opinion as well. Have d/w primary.     Labs: Basic Metabolic Panel:  Recent Labs Lab 06/24/2015 2340 07/07/15 1000  07/09/15 0501 07/10/15 0550 07/11/15 0657  NA 132* 137  < > 137 140 139  K 5.1 3.6  < > 3.5 4.1 5.3*  CL 96* 99*  < > 99* 102 100*  CO2 23 28  < > 27 28 28   GLUCOSE 169* 111*  < > 87 99 103*  BUN 44* 19  < >  41* 23* 42*  CREATININE 10.31* 4.60*  < > 6.90* 5.65* 8.23*  CALCIUM 14.8* 11.0*  < > 12.2* 10.5* 11.7*  PHOS 8.2* 3.9  --  6.3*  --   --   < > = values in this interval not displayed. Liver Function Tests:  Recent Labs Lab 07/02/2015 2340 07/07/15 1000 07/09/15 0501  ALBUMIN 3.1* 3.0* 2.7*   No results for input(s): LIPASE, AMYLASE in the last 168 hours. No results for input(s): AMMONIA in the last 168 hours. CBC:  Recent Labs Lab 07/04/2015 1530 06/14/2015 2340 07/09/15 0501 07/10/15 0550  WBC 17.2* 18.0* 9.0 10.7*  HGB 9.3* 9.2* 7.7* 7.9*  HCT 26.9* 26.0* 22.1* 22.9*  MCV 105.5* 104.0* 104.7* 106.0*  PLT 78* 68* 65* 68*   Cardiac Enzymes: No results for input(s): CKTOTAL, CKMB, CKMBINDEX, TROPONINI in the last 168 hours. CBG:  Recent Labs Lab 07/10/15 1202 07/10/15 1717 07/10/15 2126 07/11/15 0428 07/11/15 0722  GLUCAP 100* 113* 102* 103* 100*    Studies/Results: Mr Lumbar Spine Wo Contrast  07/10/2015  CLINICAL DATA:  81 year old male with multiple myeloma. Vertebroplasty 1 month ago. Past 2 weeks with increasing decline with falling at home.  Interventional radiology requested MR to evaluate sacral for possible sacral vertebroplasty. Hemodialysis. Hypertension. Subsequent encounter. EXAM: MRI LUMBAR SPINE WITHOUT CONTRAST TECHNIQUE: Multiplanar, multisequence MR imaging of the lumbar spine was performed. No intravenous contrast was administered. COMPARISON:  06/09/2015 lumbar spine MR. FINDINGS: Last fully open disk space is labeled L5-S1. Present examination incorporates from T11-12 disc space through lower sacrum. Diffuse altered signal intensity throughout all visualized bone marrow some which may represent anemia where as several T2 bright areas throughout visualized bone marrow are noted and consistent with myeloma involvement. When compared to prior examination, the degree of myeloma involvement has progressed including: Right L1 pedicle facet junction now measuring  1.3 cm versus prior 0.9 cm. L3 vertebral body involvement with new mild superior endplate compression fracture with 10-15% loss height. Diffuse involvement of the right sacrum now spanning over 4.8 cm versus prior 2.3 cm. Additionally, expansile destructive lesion with pathologic fracture right ilium once again noted measuring 4.7 x 4.6 cm and not completely imaged previously. Interval cement augmentation L2 and L4 vertebral body. Further slight loss of height L2 vertebral body (inferior endplate) with 70% loss of height centrally. Degenerative changes including: L1-2: Facet bony overgrowth. Minimal bulge. Very mild spinal stenosis. L2-3: Moderate facet degenerative changes and ligamentum flavum hypertrophy. Bulge slightly greater right lateral position. Short pedicles. Multifactorial moderate spinal stenosis. L3-4: Facet degenerative changes and ligamentum flavum hypertrophy greater on the left. Bulge. Short pedicles. Multifactorial moderate spinal stenosis slightly greater on left. L4-5: Facet bony overgrowth and ligamentum flavum hypertrophy. Prior right hemilaminectomy. Bulge and spur. Multifactorial marked bilateral lateral recess stenosis, moderate spinal stenosis and mild to moderate foraminal narrowing. L5-S1: Disc osteophyte complex with greatest extension right lateral position. Prominent right foraminal narrowing and encroachment upon exiting right L5 nerve root. Mild left foraminal narrowing. Marked lateral recess stenosis greater right. Mild spinal stenosis. IMPRESSION: When compared to prior examination, the degree of myeloma involvement has progressed including: Right L1 pedicle facet junction now measuring 1.3 cm versus prior 0.9 cm. L3 vertebral body involvement with new mild superior endplate compression fracture with 10-15% loss height. Diffuse involvement of the right sacrum now spanning over 4.8 cm versus prior 2.3 cm. Expansile destructive lesion with pathologic fracture right ilium once again  noted measuring 4.7 x 4.6 cm and not completely imaged previously. Interval cement augmentation L2 and L4 vertebral body. Further slight loss of height L2 vertebral body (inferior endplate) with 70% loss of height centrally. Degenerative changes including: L1-2 very mild spinal stenosis. L2-3 multifactorial moderate spinal stenosis. L3-4 multifactorial moderate spinal stenosis slightly greater on left. L4-5 multifactorial marked bilateral lateral recess stenosis, moderate spinal stenosis and mild to moderate foraminal narrowing. L5-S1 multifactorial marked right foraminal narrowing and encroachment upon exiting right L5 nerve root. Mild left foraminal narrowing. Marked lateral recess stenosis greater right. Mild spinal stenosis. Electronically Signed   By: Genia Del M.D.   On: 07/10/2015 16:02   Medications:   . acyclovir  400 mg Oral BID  . amLODipine  5 mg Oral Daily  . budesonide-formoterol  2 puff Inhalation BID  . calcitonin  320 Units Subcutaneous Q12H  . darbepoetin (ARANESP) injection - DIALYSIS  200 mcg Intravenous Q Wed-HD  . fluticasone  1 spray Each Nare Daily  . gabapentin  100 mg Oral QHS  . heparin  5,000 Units Subcutaneous 3 times per day  . insulin aspart  0-9 Units Subcutaneous TID WC  . metoprolol succinate  25 mg Oral Daily  . metoprolol succinate  50 mg Oral Daily  . pantoprazole  40 mg Oral Daily  . sodium chloride  3 mL Intravenous Q12H

## 2015-07-11 NOTE — Progress Notes (Signed)
PT Cancellation Note  Patient Details Name: Justin Woods MRN: [id number] DOB: May 21, 1931   Cancelled Treatment:    Reason Eval/Treat Not Completed: Medical issues which prohibited therapy (need guidance on fractures and changes of lumbar spine, pelv)is.  Will contact MD for clarification of his restrictions/permission for mobility.     Ramond Dial 07/11/2015, 9:17 AM   Mee Hives, PT MS Acute Rehab Dept. Number: [facility] [id number] and [place] [phone]

## 2015-07-12 ENCOUNTER — Other Ambulatory Visit: Payer: PPO

## 2015-07-12 ENCOUNTER — Ambulatory Visit: Payer: PPO | Admitting: Internal Medicine

## 2015-07-12 LAB — CULTURE, BLOOD (ROUTINE X 2)
CULTURE: NO GROWTH
CULTURE: NO GROWTH

## 2015-07-12 LAB — BASIC METABOLIC PANEL
ANION GAP: 12 (ref 5–15)
BUN: 26 mg/dL — AB (ref 6–20)
CHLORIDE: 98 mmol/L — AB (ref 101–111)
CO2: 26 mmol/L (ref 22–32)
Calcium: 10.3 mg/dL (ref 8.9–10.3)
Creatinine, Ser: 5.23 mg/dL — ABNORMAL HIGH (ref 0.61–1.24)
GFR, EST AFRICAN AMERICAN: 11 mL/min — AB (ref >60)
GFR, EST NON AFRICAN AMERICAN: 9 mL/min — AB (ref >60)
Glucose, Bld: 110 mg/dL — ABNORMAL HIGH (ref 65–99)
POTASSIUM: 4.6 mmol/L (ref 3.5–5.1)
SODIUM: 136 mmol/L (ref 135–145)

## 2015-07-12 LAB — CALCIUM, IONIZED: CALCIUM, IONIZED, SERUM: 6.6 mg/dL — AB (ref 4.5–5.6)

## 2015-07-12 LAB — GLUCOSE, CAPILLARY
GLUCOSE-CAPILLARY: 95 mg/dL (ref 65–99)
Glucose-Capillary: 101 mg/dL — ABNORMAL HIGH (ref 65–99)
Glucose-Capillary: 119 mg/dL — ABNORMAL HIGH (ref 65–99)
Glucose-Capillary: 96 mg/dL (ref 65–99)
Glucose-Capillary: 106 mg/dL — ABNORMAL HIGH (ref 65–99)

## 2015-07-12 LAB — CBC
HCT: 24 % — ABNORMAL LOW (ref 39.0–52.0)
HEMOGLOBIN: 8.2 g/dL — AB (ref 13.0–17.0)
MCH: 36 pg — AB (ref 26.0–34.0)
MCHC: 34.2 g/dL (ref 30.0–36.0)
MCV: 105.3 fL — ABNORMAL HIGH (ref 78.0–100.0)
PLATELETS: 61 10*3/uL — AB (ref 150–400)
RBC: 2.28 MIL/uL — AB (ref 4.22–5.81)
RDW: 14.2 % (ref 11.5–15.5)
WBC: 12.9 10*3/uL — AB (ref 4.0–10.5)

## 2015-07-12 NOTE — Progress Notes (Signed)
[facility] PROGRESS NOTE  Justin Woods RKY:[id number] DOB: 13-Oct-1930 DOA: 06/22/2015 PCP: Wenda Low, MD  Interim Summary Justin Woods is a 81 year old with a past medical history of multiple myeloma with light chain nephropathy and renal failure was diagnosed in June 2016 now on hemodialysis scheduled Mondays Wednesdays and Fridays. He is undergoing therapy with Velcade and Decadron. Patient had been receiving his care at the cancer center. He was brought to the emergency department on 06/22/2015 after having a fall at home. Family members reporting a progressive functional decline over the past 2 weeks, having spells of increased confusion. Initial lab work revealed severe hypercalcemia having calcium greater than 15. Nephrology was consulted as he was started on calcitonin along with hemodialysis. His calcium came down after hemodialysis, however started trending back up a few days later for which Nephrology gave Pamidronate. Patient having worsening back pain, having h/o kyphoplasty for treatment of pathologic L2 fracture. IR recommended MRI of lower back.   Assessment/Plan: 1. Severe hypercalcemia -calcium greater than 15 on admission -improving, given IV pamidronate on 11/28 -on SQ calcitonin, stop this now -Total calcium today 10.3 -Dr.Zamora, d/w both Dr. Earlie Server and Dr. Jonnie Finner.   2.  Failure to thrive/functional decline/acute encephalopathy -Family members reporting progressive functional decline over the past 2 weeks characterize by mental status changes, confusion, generalized weakness, having falls at home. -Suspect that hypercalcemia is a significant contributor, however, it is likely that this is multifactorial. He has multiple comorbidities including multiple myeloma, end-stage renal disease on hemodialysis, compression fractures. -Progression of multiple myeloma noted on MRI of LS-spine  -Overall poor prognosis , discussed with Dr.Schertz and Julien Nordmann, wife  understands poor prognosis, she will d/w family, leaning towards comfort focused approach  3.  New onset back pain/lumbar compression fracture/PRogressive Myeloma. -recent history of pathological fracture involving L2 s/p kyphoplasty on 06/21/2015. -He was evaluated by interventional radiology, recommended MRI shows new compression fracture at L2 likely to be pathologic and progression of myeloma. kyphoplasty can be considered  4.  End-stage renal disease. -Nephrology following, undergoing hemodialysis during this hospitalization  -greatly appreciate Renal input, to decide regarding stopping HD  5.  Thrombocytopenia. -Labs revealing platelet count of 68,000, which could be secondary to velcade therapy, myeloma  6.  Leukocytosis. -Presenting lab per showing white count of 17,200, improving -now 11.9K -Initial workup not revealing obvious source of infection. He has been afebrile. -Blood cultures obtained on 07/07/2015 show no growth after overnight incubation  7.   Hypertension. -Blood pressures elevated, increased metoprolol to 50 mg by mouth daily -increased amlodipine to $RemoveBefor'10mg'WsDmdpSKFBdb$   8.  Status post fall -Likely related to functional decline and consequences of severe hypercalcemia, multiple myeloma, end-stage renal disease.  9. Nausea and Vomiting -due to pamidronate, 50% chance of GI sideeffects supportive care  DVT proph: Hep SQ  Code Status: Full code, will d/w code status later today Family Communication:  wife at bedside Disposition Plan: to be determined  Consultants:  Nephrology  Procedures Hemodialysis   HPI/Subjective: Feels a little better, no further vomiting, nausea  Objective: Filed Vitals:   07/12/15 0513 07/12/15 0945  BP: 160/78 140/66  Pulse: 107 92  Temp:  98 F (36.7 C)  Resp:  16    Intake/Output Summary (Last 24 hours) at 07/12/15 1321 Last data filed at 07/12/15 0900  Gross per 24 hour  Intake    360 ml  Output   2000 ml  Net  -1640 ml    Filed Weights   07/10/15 2130  07/11/15 1315 07/11/15 2030  Weight: 82 kg (180 lb 12.4 oz) 82 kg (180 lb 12.4 oz) 83.1 kg (183 lb 3.2 oz)    Exam:   General:  Ill appearing, AAOx2, uncomfortable  Cardiovascular: regular rate rhythm normal S1-S2 no murmurs or gallops  Respiratory: Normal respiratory effort, diminished breath sounds bilaterally  Abdomen: Soft nontender nondistended  Musculoskeletal: moves all extremities, leg movements, limited by low back pain  Data Reviewed: Basic Metabolic Panel:  Recent Labs Lab 07/01/2015 2340 07/07/15 1000 07/08/15 0334 07/09/15 0501 07/10/15 0550 07/11/15 0657 07/12/15 0610  NA 132* 137 138 137 140 139 136  K 5.1 3.6 3.3* 3.5 4.1 5.3* 4.6  CL 96* 99* 99* 99* 102 100* 98*  CO2 $Re'23 28 31 27 28 28 26  'cNm$ GLUCOSE 169* 111* 86 87 99 103* 110*  BUN 44* 19 15 41* 23* 42* 26*  CREATININE 10.31* 4.60* 3.53* 6.90* 5.65* 8.23* 5.23*  CALCIUM 14.8* 11.0* 10.4* 12.2* 10.5* 11.7* 10.3  PHOS 8.2* 3.9  --  6.3*  --   --   --    Liver Function Tests:  Recent Labs Lab 06/12/2015 2340 07/07/15 1000 07/09/15 0501  ALBUMIN 3.1* 3.0* 2.7*   No results for input(s): LIPASE, AMYLASE in the last 168 hours. No results for input(s): AMMONIA in the last 168 hours. CBC:  Recent Labs Lab 07/04/2015 2340 07/09/15 0501 07/10/15 0550 07/11/15 0657 07/12/15 0610  WBC 18.0* 9.0 10.7* 11.9* 12.9*  HGB 9.2* 7.7* 7.9* 7.7* 8.2*  HCT 26.0* 22.1* 22.9* 22.4* 24.0*  MCV 104.0* 104.7* 106.0* 105.7* 105.3*  PLT 68* 65* 68* 64* 61*   Cardiac Enzymes: No results for input(s): CKTOTAL, CKMB, CKMBINDEX, TROPONINI in the last 168 hours. BNP (last 3 results) No results for input(s): BNP in the last 8760 hours.  ProBNP (last 3 results) No results for input(s): PROBNP in the last 8760 hours.  CBG:  Recent Labs Lab 07/11/15 1711 07/11/15 2027 07/12/15 0505 07/12/15 0810 07/12/15 1151  GLUCAP 101* 96 101* 119* 96    Recent Results (from the past 240  hour(s))  Culture, blood (routine x 2)     Status: None (Preliminary result)   Collection Time: 07/07/15  4:34 PM  Result Value Ref Range Status   Specimen Description BLOOD URINE, CATHETERIZED  Final   Special Requests BOTTLES DRAWN AEROBIC AND ANAEROBIC 10CC  Final   Culture NO GROWTH 4 DAYS  Final   Report Status PENDING  Incomplete  Culture, blood (routine x 2)     Status: None (Preliminary result)   Collection Time: 07/07/15  5:20 PM  Result Value Ref Range Status   Specimen Description BLOOD HEMODIALYSIS CATHETER  Final   Special Requests BOTTLES DRAWN AEROBIC AND ANAEROBIC 10CC  Final   Culture NO GROWTH 4 DAYS  Final   Report Status PENDING  Incomplete  Surgical pcr screen     Status: None   Collection Time: 07/11/15  6:09 AM  Result Value Ref Range Status   MRSA, PCR NEGATIVE NEGATIVE Final   Staphylococcus aureus NEGATIVE NEGATIVE Final    Comment:        The Xpert SA Assay (FDA approved for NASAL specimens in patients over 65 years of age), is one component of a comprehensive surveillance program.  Test performance has been validated by [facility] for patients greater than or equal to 50 year old. It is not intended to diagnose infection nor to guide or monitor treatment.      Studies:  Justin Lumbar Spine Wo Contrast  07/10/2015  CLINICAL DATA:  81 year old male with multiple myeloma. Vertebroplasty 1 month ago. Past 2 weeks with increasing decline with falling at home. Interventional radiology requested Justin to evaluate sacral for possible sacral vertebroplasty. Hemodialysis. Hypertension. Subsequent encounter. EXAM: MRI LUMBAR SPINE WITHOUT CONTRAST TECHNIQUE: Multiplanar, multisequence Justin imaging of the lumbar spine was performed. No intravenous contrast was administered. COMPARISON:  06/09/2015 lumbar spine Justin. FINDINGS: Last fully open disk space is labeled L5-S1. Present examination incorporates from T11-12 disc space through lower sacrum. Diffuse altered signal  intensity throughout all visualized bone marrow some which may represent anemia where as several T2 bright areas throughout visualized bone marrow are noted and consistent with myeloma involvement. When compared to prior examination, the degree of myeloma involvement has progressed including: Right L1 pedicle facet junction now measuring 1.3 cm versus prior 0.9 cm. L3 vertebral body involvement with new mild superior endplate compression fracture with 10-15% loss height. Diffuse involvement of the right sacrum now spanning over 4.8 cm versus prior 2.3 cm. Additionally, expansile destructive lesion with pathologic fracture right ilium once again noted measuring 4.7 x 4.6 cm and not completely imaged previously. Interval cement augmentation L2 and L4 vertebral body. Further slight loss of height L2 vertebral body (inferior endplate) with 70% loss of height centrally. Degenerative changes including: L1-2: Facet bony overgrowth. Minimal bulge. Very mild spinal stenosis. L2-3: Moderate facet degenerative changes and ligamentum flavum hypertrophy. Bulge slightly greater right lateral position. Short pedicles. Multifactorial moderate spinal stenosis. L3-4: Facet degenerative changes and ligamentum flavum hypertrophy greater on the left. Bulge. Short pedicles. Multifactorial moderate spinal stenosis slightly greater on left. L4-5: Facet bony overgrowth and ligamentum flavum hypertrophy. Prior right hemilaminectomy. Bulge and spur. Multifactorial marked bilateral lateral recess stenosis, moderate spinal stenosis and mild to moderate foraminal narrowing. L5-S1: Disc osteophyte complex with greatest extension right lateral position. Prominent right foraminal narrowing and encroachment upon exiting right L5 nerve root. Mild left foraminal narrowing. Marked lateral recess stenosis greater right. Mild spinal stenosis. IMPRESSION: When compared to prior examination, the degree of myeloma involvement has progressed including: Right  L1 pedicle facet junction now measuring 1.3 cm versus prior 0.9 cm. L3 vertebral body involvement with new mild superior endplate compression fracture with 10-15% loss height. Diffuse involvement of the right sacrum now spanning over 4.8 cm versus prior 2.3 cm. Expansile destructive lesion with pathologic fracture right ilium once again noted measuring 4.7 x 4.6 cm and not completely imaged previously. Interval cement augmentation L2 and L4 vertebral body. Further slight loss of height L2 vertebral body (inferior endplate) with 70% loss of height centrally. Degenerative changes including: L1-2 very mild spinal stenosis. L2-3 multifactorial moderate spinal stenosis. L3-4 multifactorial moderate spinal stenosis slightly greater on left. L4-5 multifactorial marked bilateral lateral recess stenosis, moderate spinal stenosis and mild to moderate foraminal narrowing. L5-S1 multifactorial marked right foraminal narrowing and encroachment upon exiting right L5 nerve root. Mild left foraminal narrowing. Marked lateral recess stenosis greater right. Mild spinal stenosis. Electronically Signed   By: Genia Del M.D.   On: 07/10/2015 16:02    Scheduled Meds: . acyclovir  400 mg Oral BID  . amLODipine  10 mg Oral Daily  . budesonide-formoterol  2 puff Inhalation BID  . calcitonin  320 Units Subcutaneous Q12H  . darbepoetin (ARANESP) injection - DIALYSIS  200 mcg Intravenous Q Wed-HD  . feeding supplement (NEPRO CARB STEADY)  237 mL Oral TID BM  . fluticasone  1 spray Each Nare Daily  .  gabapentin  100 mg Oral QHS  . heparin  5,000 Units Subcutaneous 3 times per day  . insulin aspart  0-9 Units Subcutaneous TID WC  . metoprolol succinate  50 mg Oral Daily  . pantoprazole  40 mg Oral Daily  . sodium chloride  3 mL Intravenous Q12H   Continuous Infusions:   Principal Problem:   Hypercalcemia of malignancy Active Problems:   End-stage renal disease on hemodialysis (HCC)   Essential hypertension   Multiple  myeloma (HCC)   Leukocytosis   Anemia of chronic disease   Acute encephalopathy   Hypercalcemia    Time spent: 30 min    Raeleen Winstanley  [facility] Pager [phone] If 7PM-7AM, please contact night-coverage at [email or web address], password [facility] 07/12/2015, 1:21 PM  LOS: 6 days

## 2015-07-12 NOTE — Care Management Important Message (Signed)
Important Message  Patient Details  Name: Justin Woods MRN: [id number] Date of Birth: 06-Feb-1931   Medicare Important Message Given:  Yes    [name] P Ivory Maduro 07/12/2015, 1:36 PM

## 2015-07-12 NOTE — Progress Notes (Signed)
Subjective:  Tolerated HD yest/  recurring Zofran for nausea after Oatmeal   Objective Vital signs in last 24 hours: Filed Vitals:   07/11/15 2029 07/11/15 2030 07/12/15 0456 07/12/15 0513  BP:  138/66 168/70 160/78  Pulse:  100 109 107  Temp:  99.1 F (37.3 C) 98.8 F (37.1 C)   TempSrc:  Oral Oral   Resp:  17 16   Height:      Weight:  83.1 kg (183 lb 3.2 oz)    SpO2: 95% 94% 92%    Weight change: 0 kg (0 lb) Physical Exam: General: alert NAD Heart: RRR , no M/r/G Lungs: CTA Abdomen: bs pos. Soft nontender and Non distended  Extremities: no pedal edema Dialysis Access: L FA AVF pos bruit / R IJ perm cath   OP HD= [facility] MWF EDW 78kg HD Bath 2K/2.25Ca 4h Heparin 2400. Access RIJ TDC w maturing L cimino AVF Micera 248mg every 2 weeks  Problem/Plan: 1. Hypercalcemia d/t myeloma primarily (+/- meds)- sp pamidronate, started calcitonin. Ca 10.3 this am <11.7,10.5<12.2 on  bisphosphonate, calcitonin and low Ca HD bath. MRI showing worsening tumor disease of lumbar spine and sacrum/ Oncology  Yest Dr. MJulien Nordmann Notes "his treatment is of palliative nature and the probability of cure in his condition is very slim/recommended for the patient to consider palliative care and hospice." Patient will have discussion with his family before making final decision .. 2. ESRD - on schedule MWF 3. Vol up 5 kg.( BY BED WT) not in resp distress 4.HTN amlod/ mtp 168/70 bp vol in hd as tolerates 5 .MBD off Vit D/ Ca binder 6 .AMS still lethargic but alert And OX3 7. Myeloma with hx comp fractures sp kyphoplasty 8 .Anemia on esa q wed hd 200 hgb 8.2 this am  9 .Back pain - major issue, compression fx's and ++spinal tumor burden  DErnest Haber PA-C [facility]12/08/2014,8:44 AM  LOS: 6 days    Pt seen, examined and agree w A/P as above.  RKelly SplinterMD [facility] pager [phone]   cell [phone]/08/2014, 2:43  PM     Labs: Basic Metabolic Panel:  Recent Labs Lab 07/09/2015 2340 07/07/15 1000  07/09/15 0501 07/10/15 0550 07/11/15 0657 07/12/15 0610  NA 132* 137  < > 137 140 139 136  K 5.1 3.6  < > 3.5 4.1 5.3* 4.6  CL 96* 99*  < > 99* 102 100* 98*  CO2 23 28  < > _0 GLUCOSE 169* 111*  < > 87 99 103* 110*  BUN 44* 19  < > 41* 23* 42* 26*  CREATININE 10.31* 4.60*  < > 6.90* 5.65* 8.23* 5.23*  CALCIUM 14.8* 11.0*  < > 12.2* 10.5* 11.7* 10.3  PHOS 8.2* 3.9  --  6.3*  --   --   --   < > = values in this interval not displayed. Liver Function Tests:  Recent Labs Lab 06/22/2015 2340 07/07/15 1000 07/09/15 0501  ALBUMIN 3.1* 3.0* 2.7*   No results for input(s): LIPASE, AMYLASE in the last 168 hours. No results for input(s): AMMONIA in the last 168 hours. CBC:  Recent Labs Lab 06/30/2015 2340 07/09/15 0501 07/10/15 0550 07/11/15 0657 07/12/15 0610  WBC 18.0* 9.0 10.7* 11.9* 12.9*  HGB 9.2* 7.7* 7.9* 7.7* 8.2*  HCT 26.0* 22.1* 22.9* 22.4* 24.0*  MCV 104.0* 104.7* 106.0* 105.7* 105.3*  PLT 68* 65* 68* 64* 61*  Cardiac Enzymes: No results for input(s): CKTOTAL, CKMB, CKMBINDEX, TROPONINI in the last 168 hours. CBG:  Recent Labs Lab 07/11/15 1131 07/11/15 1711 07/11/15 2027 07/12/15 0505 07/12/15 0810  GLUCAP 123* 101* 96 101* 119*    Studies/Results: Mr Lumbar Spine Wo Contrast  07/10/2015  CLINICAL DATA:  81 year old male with multiple myeloma. Vertebroplasty 1 month ago. Past 2 weeks with increasing decline with falling at home. Interventional radiology requested MR to evaluate sacral for possible sacral vertebroplasty. Hemodialysis. Hypertension. Subsequent encounter. EXAM: MRI LUMBAR SPINE WITHOUT CONTRAST TECHNIQUE: Multiplanar, multisequence MR imaging of the lumbar spine was performed. No intravenous contrast was administered. COMPARISON:  06/09/2015 lumbar spine MR. FINDINGS: Last fully open disk space is labeled L5-S1. Present examination incorporates  from T11-12 disc space through lower sacrum. Diffuse altered signal intensity throughout all visualized bone marrow some which may represent anemia where as several T2 bright areas throughout visualized bone marrow are noted and consistent with myeloma involvement. When compared to prior examination, the degree of myeloma involvement has progressed including: Right L1 pedicle facet junction now measuring 1.3 cm versus prior 0.9 cm. L3 vertebral body involvement with new mild superior endplate compression fracture with 10-15% loss height. Diffuse involvement of the right sacrum now spanning over 4.8 cm versus prior 2.3 cm. Additionally, expansile destructive lesion with pathologic fracture right ilium once again noted measuring 4.7 x 4.6 cm and not completely imaged previously. Interval cement augmentation L2 and L4 vertebral body. Further slight loss of height L2 vertebral body (inferior endplate) with 70% loss of height centrally. Degenerative changes including: L1-2: Facet bony overgrowth. Minimal bulge. Very mild spinal stenosis. L2-3: Moderate facet degenerative changes and ligamentum flavum hypertrophy. Bulge slightly greater right lateral position. Short pedicles. Multifactorial moderate spinal stenosis. L3-4: Facet degenerative changes and ligamentum flavum hypertrophy greater on the left. Bulge. Short pedicles. Multifactorial moderate spinal stenosis slightly greater on left. L4-5: Facet bony overgrowth and ligamentum flavum hypertrophy. Prior right hemilaminectomy. Bulge and spur. Multifactorial marked bilateral lateral recess stenosis, moderate spinal stenosis and mild to moderate foraminal narrowing. L5-S1: Disc osteophyte complex with greatest extension right lateral position. Prominent right foraminal narrowing and encroachment upon exiting right L5 nerve root. Mild left foraminal narrowing. Marked lateral recess stenosis greater right. Mild spinal stenosis. IMPRESSION: When compared to prior  examination, the degree of myeloma involvement has progressed including: Right L1 pedicle facet junction now measuring 1.3 cm versus prior 0.9 cm. L3 vertebral body involvement with new mild superior endplate compression fracture with 10-15% loss height. Diffuse involvement of the right sacrum now spanning over 4.8 cm versus prior 2.3 cm. Expansile destructive lesion with pathologic fracture right ilium once again noted measuring 4.7 x 4.6 cm and not completely imaged previously. Interval cement augmentation L2 and L4 vertebral body. Further slight loss of height L2 vertebral body (inferior endplate) with 70% loss of height centrally. Degenerative changes including: L1-2 very mild spinal stenosis. L2-3 multifactorial moderate spinal stenosis. L3-4 multifactorial moderate spinal stenosis slightly greater on left. L4-5 multifactorial marked bilateral lateral recess stenosis, moderate spinal stenosis and mild to moderate foraminal narrowing. L5-S1 multifactorial marked right foraminal narrowing and encroachment upon exiting right L5 nerve root. Mild left foraminal narrowing. Marked lateral recess stenosis greater right. Mild spinal stenosis. Electronically Signed   By: Genia Del M.D.   On: 07/10/2015 16:02   Medications:   . acyclovir  400 mg Oral BID  . amLODipine  10 mg Oral Daily  . budesonide-formoterol  2 puff Inhalation BID  .  calcitonin  320 Units Subcutaneous Q12H  . darbepoetin (ARANESP) injection - DIALYSIS  200 mcg Intravenous Q Wed-HD  . feeding supplement (NEPRO CARB STEADY)  237 mL Oral TID BM  . fluticasone  1 spray Each Nare Daily  . gabapentin  100 mg Oral QHS  . heparin  5,000 Units Subcutaneous 3 times per day  . insulin aspart  0-9 Units Subcutaneous TID WC  . metoprolol succinate  50 mg Oral Daily  . pantoprazole  40 mg Oral Daily  . sodium chloride  3 mL Intravenous Q12H

## 2015-07-12 NOTE — Progress Notes (Signed)
PT Cancellation Note  Patient Details Name: Justin Woods MRN: [id number] DOB: Mar 04, 1931   Cancelled Treatment:    Reason Eval/Treat Not Completed: Medical issues which prohibited therapy  Noted newly discovered pathological fractures on MRI report. Family making decision for treatment with IR vs comfort based approach. Will check back tomorrow if decision has been made. We will also need new orders for mobility and weight-bearing precautions given these new findings. Please advise.   [name] 07/12/2015, 6:00 PM  Camille Bal [place], [place]

## 2015-07-12 NOTE — Progress Notes (Signed)
Patient ID: Justin Woods, male   DOB: [birth year], 81 y.o.   MRN: [id number] Pt's latest imaging studies were reviewed by Dr. Estanislado Pandy. There are acute fractures with probable tumor involvement at L3 and rt portion of sacrum. Areas are amenable to ablation with vertebral augmentation if pt chooses to proceed. Insurance approval has not been obtained yet and once status known can proceed with scheduling process. Please call IR at [phone] or [phone] if procedure is no longer desired or plans change in view of push towards palliative care.

## 2015-07-12 DEATH — deceased

## 2015-07-13 ENCOUNTER — Ambulatory Visit: Payer: PPO

## 2015-07-13 LAB — GLUCOSE, CAPILLARY
GLUCOSE-CAPILLARY: 93 mg/dL (ref 65–99)
Glucose-Capillary: 101 mg/dL — ABNORMAL HIGH (ref 65–99)

## 2015-07-13 MED ORDER — OXYCODONE HCL 5 MG PO TABS
5.0000 mg | ORAL_TABLET | Freq: Four times a day (QID) | ORAL | Status: DC | PRN
  Administered 2015-07-13 – 2015-07-14 (×3): 5 mg via ORAL
  Filled 2015-07-13: qty 1
  Filled 2015-07-13: qty 2
  Filled 2015-07-13: qty 1

## 2015-07-13 NOTE — Consult Note (Signed)
   [facility] CM Inpatient Consult   07/13/2015  Justin Woods September 23, 1930 [id number]   [facility] Care Management follow up. Noted plan is for comfort. [facility] Care Management not appropriate at this time.  Marthenia Rolling, MSN-Ed, RN,BSN [facility] Liaison [phone]

## 2015-07-13 NOTE — Progress Notes (Signed)
PT Cancellation Note  Patient Details Name: Justin Woods MRN: [id number] DOB: 06-15-1931   Cancelled Treatment:    Reason Eval/Treat Not Completed: Medical issues which prohibited therapy; patient now on comfort measures per MD.  Will sign off for now.  Please order PT again if able to participate or benefit from services related to comfort care.     Sage Hammill,CYNDI 07/13/2015, 11:48 AM  Magda Kiel, PT [phone] 07/13/2015

## 2015-07-13 NOTE — Progress Notes (Signed)
[facility] PROGRESS NOTE  Megan Hayduk BIAS RAQ:[id number] DOB: 02-03-1931 DOA: 06/17/2015 PCP: Wenda Low, MD  Interim Summary Justin Woods is a 81 year old with a past medical history of multiple myeloma with light chain nephropathy and renal failure was diagnosed in June 2016 now on hemodialysis scheduled Mondays Wednesdays and Fridays. He is undergoing therapy with Velcade and Decadron. Patient had been receiving his care at the cancer center. He was brought to the emergency department on 06/17/2015 after having a fall at home. Family members reporting a progressive functional decline over the past 2 weeks, having spells of increased confusion. Initial lab work revealed severe hypercalcemia having calcium greater than 15. Nephrology was consulted as he was started on calcitonin along with hemodialysis. His calcium came down after hemodialysis, however started trending back up a few days later for which Nephrology gave Pamidronate. Patient having worsening back pain, having h/o kyphoplasty for treatment of pathologic L2 fracture. IR recommended MRI of lower back.   Assessment/Plan: 1. Severe hypercalcemia -calcium greater than 15 on admission -improving, given IV pamidronate on 11/28 -was on SQ calcitonin, stopped  2.  Failure to thrive/functional decline/acute encephalopathy -Family members reporting progressive functional decline over the past 2 weeks characterize by mental status changes, confusion, generalized weakness, having falls at home. -Suspect that hypercalcemia is a significant contributor, however, it is likely that this is multifactorial. He has multiple comorbidities including multiple myeloma, end-stage renal disease on hemodialysis, compression fractures. -Progression of multiple myeloma noted on MRI of LS-spine  -Overall poor prognosis , discussed with Dr.Schertz and Julien Nordmann, wife understands poor prognosis, she will d/w family, leaning towards comfort focused approach,  plan to stop HD  3.  New onset back pain/lumbar compression fracture/PRogressive Myeloma. -recent history of pathological fracture involving L2 s/p kyphoplasty on 06/21/2015. -He was evaluated by interventional radiology, recommended MRI shows new compression fracture at L2 likely to be pathologic and progression of myeloma. kyphoplasty can be considered, however now with plans for comfort measures, no plan for intervention  4.  End-stage renal disease. -Nephrology following, pt has decided to stop HD  5.  Thrombocytopenia. -Labs revealing platelet count of 68,000, which could be secondary to velcade therapy, myeloma  6.  Leukocytosis. -Presenting lab per showing white count of 17,200, improving -now 11.9K -Initial workup not revealing obvious source of infection. He has been afebrile. -Blood cultures obtained on 07/07/2015 show no growth after overnight incubation  7.   Hypertension. -cotninue metoprolol   8.  Status post fall -Likely related to functional decline and consequences of severe hypercalcemia, multiple myeloma, end-stage renal disease.  9. Nausea and Vomiting -due to pamidronate, 50% chance of GI sideeffects supportive care, resolved  DVT proph: Hep SQ  Code Status:DNR Family Communication:  wife at bedside Disposition Plan: residential hospice next week, if doesn't decline sooner  Consultants:  Nephrology  Procedures Hemodialysis   HPI/Subjective: Still with back pain, no N/V  Objective: Filed Vitals:   07/13/15 0459 07/13/15 0746  BP: 132/65 105/58  Pulse: 93 85  Temp: 98.2 F (36.8 C) 98 F (36.7 C)  Resp: 16 16    Intake/Output Summary (Last 24 hours) at 07/13/15 1322 Last data filed at 07/13/15 0600  Gross per 24 hour  Intake    120 ml  Output      0 ml  Net    120 ml   Filed Weights   07/11/15 1315 07/11/15 2030 07/12/15 2013  Weight: 82 kg (180 lb 12.4 oz) 83.1 kg (183 lb 3.2  oz) 83.7 kg (184 lb 8.4 oz)    Exam:   General:  Ill  appearing, AAOx2, more comfortable today  Cardiovascular: regular rate rhythm normal S1-S2 no murmurs or gallops  Respiratory: Normal respiratory effort, diminished breath sounds bilaterally  Abdomen: Soft nontender nondistended  Musculoskeletal: moves all extremities, leg movements, limited by low back pain  Data Reviewed: Basic Metabolic Panel:  Recent Labs Lab 07/07/2015 2340 07/07/15 1000 07/08/15 0334 07/09/15 0501 07/10/15 0550 07/11/15 0657 07/12/15 0610  NA 132* 137 138 137 140 139 136  K 5.1 3.6 3.3* 3.5 4.1 5.3* 4.6  CL 96* 99* 99* 99* 102 100* 98*  CO2 $Re'23 28 31 27 28 28 26  'PWF$ GLUCOSE 169* 111* 86 87 99 103* 110*  BUN 44* 19 15 41* 23* 42* 26*  CREATININE 10.31* 4.60* 3.53* 6.90* 5.65* 8.23* 5.23*  CALCIUM 14.8* 11.0* 10.4* 12.2* 10.5* 11.7* 10.3  PHOS 8.2* 3.9  --  6.3*  --   --   --    Liver Function Tests:  Recent Labs Lab 06/21/2015 2340 07/07/15 1000 07/09/15 0501  ALBUMIN 3.1* 3.0* 2.7*   No results for input(s): LIPASE, AMYLASE in the last 168 hours. No results for input(s): AMMONIA in the last 168 hours. CBC:  Recent Labs Lab 06/23/2015 2340 07/09/15 0501 07/10/15 0550 07/11/15 0657 07/12/15 0610  WBC 18.0* 9.0 10.7* 11.9* 12.9*  HGB 9.2* 7.7* 7.9* 7.7* 8.2*  HCT 26.0* 22.1* 22.9* 22.4* 24.0*  MCV 104.0* 104.7* 106.0* 105.7* 105.3*  PLT 68* 65* 68* 64* 61*   Cardiac Enzymes: No results for input(s): CKTOTAL, CKMB, CKMBINDEX, TROPONINI in the last 168 hours. BNP (last 3 results) No results for input(s): BNP in the last 8760 hours.  ProBNP (last 3 results) No results for input(s): PROBNP in the last 8760 hours.  CBG:  Recent Labs Lab 07/12/15 1151 07/12/15 1541 07/12/15 2128 07/13/15 0746 07/13/15 1157  GLUCAP 96 106* 95 93 101*    Recent Results (from the past 240 hour(s))  Culture, blood (routine x 2)     Status: None   Collection Time: 07/07/15  4:34 PM  Result Value Ref Range Status   Specimen Description BLOOD URINE,  CATHETERIZED  Final   Special Requests BOTTLES DRAWN AEROBIC AND ANAEROBIC 10CC  Final   Culture NO GROWTH 5 DAYS  Final   Report Status 07/12/2015 FINAL  Final  Culture, blood (routine x 2)     Status: None   Collection Time: 07/07/15  5:20 PM  Result Value Ref Range Status   Specimen Description BLOOD HEMODIALYSIS CATHETER  Final   Special Requests BOTTLES DRAWN AEROBIC AND ANAEROBIC 10CC  Final   Culture NO GROWTH 5 DAYS  Final   Report Status 07/12/2015 FINAL  Final  Surgical pcr screen     Status: None   Collection Time: 07/11/15  6:09 AM  Result Value Ref Range Status   MRSA, PCR NEGATIVE NEGATIVE Final   Staphylococcus aureus NEGATIVE NEGATIVE Final    Comment:        The Xpert SA Assay (FDA approved for NASAL specimens in patients over 70 years of age), is one component of a comprehensive surveillance program.  Test performance has been validated by [facility] for patients greater than or equal to 60 year old. It is not intended to diagnose infection nor to guide or monitor treatment.      Studies: No results found.  Scheduled Meds: . amLODipine  10 mg Oral Daily  . budesonide-formoterol  2 puff Inhalation BID  . feeding supplement (NEPRO CARB STEADY)  237 mL Oral TID BM  . fluticasone  1 spray Each Nare Daily  . gabapentin  100 mg Oral QHS  . metoprolol succinate  50 mg Oral Daily  . pantoprazole  40 mg Oral Daily  . sodium chloride  3 mL Intravenous Q12H   Continuous Infusions:   Principal Problem:   Hypercalcemia of malignancy Active Problems:   End-stage renal disease on hemodialysis (HCC)   Essential hypertension   Multiple myeloma (HCC)   Leukocytosis   Anemia of chronic disease   Acute encephalopathy   Hypercalcemia    Time spent: 30 min    Justin Woods  [facility] Pager [phone] If 7PM-7AM, please contact night-coverage at [email or web address], password [facility] 07/13/2015, 1:22 PM  LOS: 7 days

## 2015-07-13 NOTE — Progress Notes (Signed)
Subjective:  In a lot of pain in the back  Objective Vital signs in last 24 hours: Filed Vitals:   07/12/15 2047 07/13/15 0220 07/13/15 0459 07/13/15 0746  BP:   132/65 105/58  Pulse: 89  93 85  Temp:   98.2 F (36.8 C) 98 F (36.7 C)  TempSrc:   Oral Oral  Resp: $Remo'18  16 16  'EIUrm$ Height:      Weight:      SpO2: 95% 94% 94% 98%   Weight change: 1.7 kg (3 lb 12 oz) Physical Exam: General: alert NAD Heart: RRR , no M/r/G Lungs: CTA Abdomen: bs pos. Soft nontender and Non distended  Extremities: no pedal edema Dialysis Access: L FA AVF pos bruit / R IJ perm cath   OP HD= [facility] MWF EDW 78kg HD Bath 2K/2.25Ca 4h Heparin 2400. Access RIJ TDC w maturing L cimino AVF Micera 232mcg every 2 weeks  Problems: 1. Hypercalcemia d/t myeloma  2. Multiple myeloma - progressive disease 3. ESRD HD MWF 4. Vol up 5 kg 5.HTN amlod/ mtp 168/70 bp vol in hd as tolerates 6 .MBD off Vit D/ Ca binder 7.Anemia on esa q wed hd 200 hgb 8.2 this am  8 .Back pain - major issue, compression fx's and ++spinal tumor burden  Plan - spoke with patient, wife and daughter. Plan is for focus on comfort care.  Daughter wanted another 1-2 dialysis sessions for family to come in and visit but patient does not want anymore dialysis.  Have d/w primary MD.  Palliative care to assist with transition to hospice.    Kelly Splinter MD [facility] pager [phone]    cell [phone] 07/13/2015, 12:35 PM     Labs: Basic Metabolic Panel:  Recent Labs Lab 06/18/2015 2340 07/07/15 1000  07/09/15 0501 07/10/15 0550 07/11/15 0657 07/12/15 0610  NA 132* 137  < > 137 140 139 136  K 5.1 3.6  < > 3.5 4.1 5.3* 4.6  CL 96* 99*  < > 99* 102 100* 98*  CO2 23 28  < > $R'27 28 28 26  'uv$ GLUCOSE 169* 111*  < > 87 99 103* 110*  BUN 44* 19  < > 41* 23* 42* 26*  CREATININE 10.31* 4.60*  < > 6.90* 5.65* 8.23* 5.23*  CALCIUM 14.8* 11.0*  < > 12.2* 10.5* 11.7* 10.3  PHOS 8.2* 3.9  --  6.3*  --   --   --   < >  = values in this interval not displayed. Liver Function Tests:  Recent Labs Lab 07/09/2015 2340 07/07/15 1000 07/09/15 0501  ALBUMIN 3.1* 3.0* 2.7*   No results for input(s): LIPASE, AMYLASE in the last 168 hours. No results for input(s): AMMONIA in the last 168 hours. CBC:  Recent Labs Lab 07/05/2015 2340 07/09/15 0501 07/10/15 0550 07/11/15 0657 07/12/15 0610  WBC 18.0* 9.0 10.7* 11.9* 12.9*  HGB 9.2* 7.7* 7.9* 7.7* 8.2*  HCT 26.0* 22.1* 22.9* 22.4* 24.0*  MCV 104.0* 104.7* 106.0* 105.7* 105.3*  PLT 68* 65* 68* 64* 61*   Cardiac Enzymes: No results for input(s): CKTOTAL, CKMB, CKMBINDEX, TROPONINI in the last 168 hours. CBG:  Recent Labs Lab 07/12/15 1151 07/12/15 1541 07/12/15 2128 07/13/15 0746 07/13/15 1157  GLUCAP 96 106* 95 93 101*    Studies/Results: No results found. Medications:   . acyclovir  400 mg Oral BID  . amLODipine  10 mg Oral Daily  . budesonide-formoterol  2 puff Inhalation BID  . darbepoetin (ARANESP)  injection - DIALYSIS  200 mcg Intravenous Q Wed-HD  . feeding supplement (NEPRO CARB STEADY)  237 mL Oral TID BM  . fluticasone  1 spray Each Nare Daily  . gabapentin  100 mg Oral QHS  . heparin  5,000 Units Subcutaneous 3 times per day  . insulin aspart  0-9 Units Subcutaneous TID WC  . metoprolol succinate  50 mg Oral Daily  . pantoprazole  40 mg Oral Daily  . sodium chloride  3 mL Intravenous Q12H

## 2015-07-14 MED ORDER — LORAZEPAM 2 MG/ML IJ SOLN
1.0000 mg | INTRAMUSCULAR | Status: DC | PRN
  Administered 2015-07-14: 1 mg via INTRAVENOUS
  Filled 2015-07-14: qty 1

## 2015-07-14 MED ORDER — MORPHINE SULFATE (PF) 2 MG/ML IV SOLN
2.0000 mg | INTRAVENOUS | Status: DC | PRN
  Administered 2015-07-15: 2 mg via INTRAVENOUS
  Filled 2015-07-14: qty 1

## 2015-07-14 NOTE — Progress Notes (Signed)
Patient's family requesting that patient not be turned due to they want patient to stay as comfortable as possible.

## 2015-07-14 NOTE — Progress Notes (Signed)
[facility] PROGRESS NOTE  ARNEZ STONEKING ZPH:[id number] DOB: Feb 14, 1931 DOA: 06/30/2015 PCP: Wenda Low, MD  Interim Summary Mr Schoeneck is a 81 year old with a past medical history of multiple myeloma with light chain nephropathy and renal failure was diagnosed in June 2016 now on hemodialysis scheduled Mondays Wednesdays and Fridays. He is undergoing therapy with Velcade and Decadron. Patient had been receiving his care at the cancer center. He was brought to the emergency department on 06/27/2015 after having a fall at home. Family members reporting a progressive functional decline over the past 2 weeks, having spells of increased confusion. Initial lab work revealed severe hypercalcemia having calcium greater than 15. Nephrology was consulted as he was started on calcitonin along with hemodialysis. His calcium came down after hemodialysis, however started trending back up a few days later for which Nephrology gave Pamidronate. Patient having worsening back pain, having h/o kyphoplasty for treatment of pathologic L2 fracture. IR recommended MRI of lower back.   Assessment/Plan: 1. Severe hypercalcemia -calcium greater than 15 on admission -improving, given IV pamidronate on 11/28 -was on SQ calcitonin, stopped -now comfort care  2.  Failure to thrive/functional decline/acute encephalopathy -Family members reporting progressive functional decline over the past 2 weeks characterize by mental status changes, confusion, generalized weakness, having falls at home. -Suspect that hypercalcemia is a significant contributor, however, it is likely that this is multifactorial. He has multiple comorbidities including multiple myeloma, end-stage renal disease on hemodialysis, compression fractures. -Progression of multiple myeloma noted on MRI of LS-spine  -Overall poor prognosis , discussed with Dr.Schertz and Julien Nordmann, wife understands poor prognosis, comfort care only, stopped HD since  yesterday -Add IV morphine and ativan for symptom management, plan for comfort measures only -will pursue Residential Hospice early next week  3.  New onset back pain/lumbar compression fracture/PRogressive Myeloma. -recent history of pathological fracture involving L2 s/p kyphoplasty on 06/21/2015. -He was evaluated by interventional radiology, recommended MRI shows new compression fracture at L2 likely to be pathologic and progression of myeloma. kyphoplasty can be considered, however now with plans for comfort measures, no plan for intervention  4.  End-stage renal disease. -Nephrology following, pt has decided to stop HD  5.  Thrombocytopenia. -Labs revealing platelet count of 68,000, which could be secondary to velcade therapy, myeloma  6.  Leukocytosis. -Presenting lab per showing white count of 17,200, improving -now 11.9K -Initial workup not revealing obvious source of infection. He has been afebrile. -Blood cultures obtained on 07/07/2015 show no growth after overnight incubation  7.   Hypertension. -cotninue metoprolol   8.  Status post fall -Likely related to functional decline and consequences of severe hypercalcemia, multiple myeloma, end-stage renal disease.  9. Nausea and Vomiting -due to pamidronate, 50% chance of GI sideeffects supportive care, resolved  DVT proph: Hep SQ  Code Status:DNR Family Communication:  daughter at bedside Disposition Plan: residential hospice next week, if doesn't decline sooner  Consultants:  Nephrology  Procedures Hemodialysis   HPI/Subjective: Still with back pain, no N/V, meds helping some, some tremors  Objective: Filed Vitals:   07/14/15 0809 07/14/15 0951  BP: 125/55   Pulse: 94 97  Temp: 98.7 F (37.1 C)   Resp: 16 18    Intake/Output Summary (Last 24 hours) at 07/14/15 1048 Last data filed at 07/14/15 0600  Gross per 24 hour  Intake    660 ml  Output      0 ml  Net    660 ml   [employer]  07/11/15  2030 07/12/15 2013 07/13/15 2020  Weight: 83.1 kg (183 lb 3.2 oz) 83.7 kg (184 lb 8.4 oz) 76.204 kg (168 lb)    Exam:   General:  Ill appearing, AAOx2, more comfortable today  Cardiovascular: regular rate rhythm normal S1-S2 no murmurs or gallops  Respiratory: Normal respiratory effort, diminished breath sounds bilaterally  Abdomen: Soft nontender nondistended  Musculoskeletal: moves all extremities, leg movements, limited by low back pain  Data Reviewed: Basic Metabolic Panel:  Recent Labs Lab 07/08/15 0334 07/09/15 0501 07/10/15 0550 07/11/15 0657 07/12/15 0610  NA 138 137 140 139 136  K 3.3* 3.5 4.1 5.3* 4.6  CL 99* 99* 102 100* 98*  CO2 $Re'31 27 28 28 26  'VnO$ GLUCOSE 86 87 99 103* 110*  BUN 15 41* 23* 42* 26*  CREATININE 3.53* 6.90* 5.65* 8.23* 5.23*  CALCIUM 10.4* 12.2* 10.5* 11.7* 10.3  PHOS  --  6.3*  --   --   --    Liver Function Tests:  Recent Labs Lab 07/09/15 0501  ALBUMIN 2.7*   No results for input(s): LIPASE, AMYLASE in the last 168 hours. No results for input(s): AMMONIA in the last 168 hours. CBC:  Recent Labs Lab 07/09/15 0501 07/10/15 0550 07/11/15 0657 07/12/15 0610  WBC 9.0 10.7* 11.9* 12.9*  HGB 7.7* 7.9* 7.7* 8.2*  HCT 22.1* 22.9* 22.4* 24.0*  MCV 104.7* 106.0* 105.7* 105.3*  PLT 65* 68* 64* 61*   Cardiac Enzymes: No results for input(s): CKTOTAL, CKMB, CKMBINDEX, TROPONINI in the last 168 hours. BNP (last 3 results) No results for input(s): BNP in the last 8760 hours.  ProBNP (last 3 results) No results for input(s): PROBNP in the last 8760 hours.  CBG:  Recent Labs Lab 07/12/15 1151 07/12/15 1541 07/12/15 2128 07/13/15 0746 07/13/15 1157  GLUCAP 96 106* 95 93 101*    Recent Results (from the past 240 hour(s))  Culture, blood (routine x 2)     Status: None   Collection Time: 07/07/15  4:34 PM  Result Value Ref Range Status   Specimen Description BLOOD URINE, CATHETERIZED  Final   Special Requests BOTTLES DRAWN  AEROBIC AND ANAEROBIC 10CC  Final   Culture NO GROWTH 5 DAYS  Final   Report Status 07/12/2015 FINAL  Final  Culture, blood (routine x 2)     Status: None   Collection Time: 07/07/15  5:20 PM  Result Value Ref Range Status   Specimen Description BLOOD HEMODIALYSIS CATHETER  Final   Special Requests BOTTLES DRAWN AEROBIC AND ANAEROBIC 10CC  Final   Culture NO GROWTH 5 DAYS  Final   Report Status 07/12/2015 FINAL  Final  Surgical pcr screen     Status: None   Collection Time: 07/11/15  6:09 AM  Result Value Ref Range Status   MRSA, PCR NEGATIVE NEGATIVE Final   Staphylococcus aureus NEGATIVE NEGATIVE Final    Comment:        The Xpert SA Assay (FDA approved for NASAL specimens in patients over 80 years of age), is one component of a comprehensive surveillance program.  Test performance has been validated by [employer] for patients greater than or equal to 17 year old. It is not intended to diagnose infection nor to guide or monitor treatment.      Studies: No results found.  Scheduled Meds: . amLODipine  10 mg Oral Daily  . budesonide-formoterol  2 puff Inhalation BID  . feeding supplement (NEPRO CARB STEADY)  237 mL Oral TID BM  .  fluticasone  1 spray Each Nare Daily  . gabapentin  100 mg Oral QHS  . metoprolol succinate  50 mg Oral Daily  . pantoprazole  40 mg Oral Daily  . sodium chloride  3 mL Intravenous Q12H   Continuous Infusions:   Principal Problem:   Hypercalcemia of malignancy Active Problems:   End-stage renal disease on hemodialysis (HCC)   Essential hypertension   Multiple myeloma (HCC)   Leukocytosis   Anemia of chronic disease   Acute encephalopathy   Hypercalcemia    Time spent: 30 min    Densel Kronick  [facility] Pager [phone] If 7PM-7AM, please contact night-coverage at [email or web address], password [facility] 07/14/2015, 10:48 AM  LOS: 8 days

## 2015-07-14 NOTE — Progress Notes (Signed)
Patient is now on full comfort care, no further dialysis.  Will sign off.   Kelly Splinter MD [employer] pager [phone]    cell [phone] 07/14/2015, 1:11 PM

## 2015-07-15 LAB — BASIC METABOLIC PANEL
Anion gap: 15 (ref 5–15)
BUN: 80 mg/dL — ABNORMAL HIGH (ref 6–20)
CHLORIDE: 95 mmol/L — AB (ref 101–111)
CO2: 22 mmol/L (ref 22–32)
CREATININE: 12.71 mg/dL — AB (ref 0.61–1.24)
Calcium: 9.4 mg/dL (ref 8.9–10.3)
GFR calc non Af Amer: 3 mL/min — ABNORMAL LOW (ref >60)
GFR, EST AFRICAN AMERICAN: 4 mL/min — AB (ref >60)
Glucose, Bld: 95 mg/dL (ref 65–99)
Potassium: 6.3 mmol/L (ref 3.5–5.1)
Sodium: 132 mmol/L — ABNORMAL LOW (ref 135–145)

## 2015-07-15 LAB — CBC
HEMATOCRIT: 22.8 % — AB (ref 39.0–52.0)
HEMOGLOBIN: 7.6 g/dL — AB (ref 13.0–17.0)
MCH: 36.2 pg — AB (ref 26.0–34.0)
MCHC: 33.3 g/dL (ref 30.0–36.0)
MCV: 108.6 fL — ABNORMAL HIGH (ref 78.0–100.0)
Platelets: 55 10*3/uL — ABNORMAL LOW (ref 150–400)
RBC: 2.1 MIL/uL — ABNORMAL LOW (ref 4.22–5.81)
RDW: 15.1 % (ref 11.5–15.5)
WBC: 14.2 10*3/uL — ABNORMAL HIGH (ref 4.0–10.5)

## 2015-07-15 NOTE — Progress Notes (Signed)
[facility] PROGRESS NOTE  TUFF CLABO YPP:[id number] DOB: May 03, 1931 DOA: 06/24/2015 PCP: Wenda Low, MD  Interim Summary Justin Woods is a 81 year old with a past medical history of multiple myeloma with light chain nephropathy and renal failure was diagnosed in June 2016 now on hemodialysis scheduled Mondays Wednesdays and Fridays. He is undergoing therapy with Velcade and Decadron. Patient had been receiving his care at the cancer center. He was brought to the emergency department on 06/12/2015 after having a fall at home. Family members reporting a progressive functional decline over the past 2 weeks, having spells of increased confusion. Initial lab work revealed severe hypercalcemia having calcium greater than 15. Nephrology was consulted as he was started on calcitonin along with hemodialysis. His calcium came down after hemodialysis, however started trending back up a few days later for which Nephrology gave Pamidronate. Patient having worsening back pain, having h/o kyphoplasty for treatment of pathologic L2 fracture. IR recommended MRI of lower back.   Assessment/Plan: 1. Severe hypercalcemia -calcium greater than 15 on admission -improved, given IV pamidronate on 11/28 -was on SQ calcitonin, stopped -now comfort care  2.  Failure to thrive/functional decline/acute encephalopathy -Family members reporting progressive functional decline over the past 2 weeks characterize by mental status changes, confusion, generalized weakness, having falls at home. -Suspect that hypercalcemia is a significant contributor, however, it is likely that this is multifactorial. He has multiple comorbidities including multiple myeloma, end-stage renal disease on hemodialysis, compression fractures. -Progression of multiple myeloma noted on MRI of LS-spine  -Overall poor prognosis , discussed with Dr.Schertz and Julien Nordmann, wife understands poor prognosis, comfort care only, stopped HD since 12/2 -Now  Comfort care,  IV morphine and ativan for symptom management -will pursue [facility]idential [facility]osp[facility] early next week if remains stable  3.  New onset back pain/lumbar compression fracture/PRogressive Myeloma. -recent history of pathological fracture involving L2 s/p kyphoplasty on 06/21/2015. -He was evaluated by interventional radiology, recommended MRI shows new compression fracture at L2 likely to be pathologic and progression of myeloma. kyphoplasty can be considered, however now with plans for comfort measures, no plan for intervention  4.  End-stage renal disease. -Nephrology following, Stopped HD  Code Status:DNR Family Communication:  Grand-daughter at bedside Disposition Plan: residential hospice early next week, if doesn't decline sooner  Consultants:  Nephrology  Procedures Hemodialysis   HPI/Subjective: No complaints, resting calmly  Objective: Filed Vitals:   07/14/15 0809 07/14/15 0951  BP: 125/55   Pulse: 94 97  Temp: 98.7 F (37.1 C)   Resp: 16 18    Intake/Output Summary (Last 24 hours) at 07/27/2015 1305 Last data filed at 08/04/2015 1001  Gross per 24 hour  Intake      0 ml  Output      0 ml  Net      0 ml   Filed Weights   07/11/15 2030 07/12/15 2013 07/13/15 2020  Weight: 83.1 kg (183 lb 3.2 oz) 83.7 kg (184 lb 8.4 oz) 76.204 kg (168 lb)    Exam:   General:  Ill appearing, AAOx2, poorly responsive this am, obtunded  Cardiovascular: regular rate rhythm normal S1-S2 no murmurs or gallops  Respiratory: ronchi throughout  Abdomen: Soft nontender nondistended  Musculoskeletal: moves all extremities, leg movements, limited by low back pain  Data Reviewed: Basic Metabolic Panel:  Recent Labs Lab 07/09/15 0501 07/10/15 0550 07/11/15 0657 07/12/15 0610 07/14/2015 0622  NA 137 140 139 136 132*  K 3.5 4.1 5.3* 4.6 6.3*  CL 99* 102 100*  98* 95*  CO2 $Re'27 28 28 26 22  'gkR$ GLUCOSE 87 99 103* 110* 95  BUN 41* 23* 42* 26* 80*  CREATININE 6.90* 5.65* 8.23*  5.23* 12.71*  CALCIUM 12.2* 10.5* 11.7* 10.3 9.4  PHOS 6.3*  --   --   --   --    Liver Function Tests:  Recent Labs Lab 07/09/15 0501  ALBUMIN 2.7*   No results for input(s): LIPASE, AMYLASE in the last 168 hours. No results for input(s): AMMONIA in the last 168 hours. CBC:  Recent Labs Lab 07/09/15 0501 07/10/15 0550 07/11/15 0657 07/12/15 0610 08/07/2015 0622  WBC 9.0 10.7* 11.9* 12.9* 14.2*  HGB 7.7* 7.9* 7.7* 8.2* 7.6*  HCT 22.1* 22.9* 22.4* 24.0* 22.8*  MCV 104.7* 106.0* 105.7* 105.3* 108.6*  PLT 65* 68* 64* 61* 55*   Cardiac Enzymes: No results for input(s): CKTOTAL, CKMB, CKMBINDEX, TROPONINI in the last 168 hours. BNP (last 3 results) No results for input(s): BNP in the last 8760 hours.  ProBNP (last 3 results) No results for input(s): PROBNP in the last 8760 hours.  CBG:  Recent Labs Lab 07/12/15 1151 07/12/15 1541 07/12/15 2128 07/13/15 0746 07/13/15 1157  GLUCAP 96 106* 95 93 101*    Recent Results (from the past 240 hour(s))  Culture, blood (routine x 2)     Status: None   Collection Time: 07/07/15  4:34 PM  Result Value Ref Range Status   Specimen Description BLOOD URINE, CATHETERIZED  Final   Special Requests BOTTLES DRAWN AEROBIC AND ANAEROBIC 10CC  Final   Culture NO GROWTH 5 DAYS  Final   Report Status 07/12/2015 FINAL  Final  Culture, blood (routine x 2)     Status: None   Collection Time: 07/07/15  5:20 PM  Result Value Ref Range Status   Specimen Description BLOOD HEMODIALYSIS CATHETER  Final   Special Requests BOTTLES DRAWN AEROBIC AND ANAEROBIC 10CC  Final   Culture NO GROWTH 5 DAYS  Final   Report Status 07/12/2015 FINAL  Final  Surgical pcr screen     Status: None   Collection Time: 07/11/15  6:09 AM  Result Value Ref Range Status   MRSA, PCR NEGATIVE NEGATIVE Final   Staphylococcus aureus NEGATIVE NEGATIVE Final    Comment:        The Xpert SA Assay (FDA approved for NASAL specimens in patients over 51 years of age), is  one component of a comprehensive surveillance program.  Test performance has been validated by [facility] for patients greater than or equal to 61 year old. It is not intended to diagnose infection nor to guide or monitor treatment.      Studies: No results found.  Scheduled Meds: . feeding supplement (NEPRO CARB STEADY)  237 mL Oral TID BM  . gabapentin  100 mg Oral QHS  . metoprolol succinate  50 mg Oral Daily  . pantoprazole  40 mg Oral Daily  . sodium chloride  3 mL Intravenous Q12H   Continuous Infusions:   Principal Problem:   Hypercalcemia of malignancy Active Problems:   End-stage renal disease on hemodialysis (HCC)   Essential hypertension   Multiple myeloma (HCC)   Leukocytosis   Anemia of chronic disease   Acute encephalopathy   Hypercalcemia    Time spent: 30 min    Bryar Rennie  [facility] Pager [phone] If 7PM-7AM, please contact night-coverage at [email or web address], password [facility] 08/07/2015, 1:05 PM  LOS: 9 days

## 2015-07-20 ENCOUNTER — Ambulatory Visit: Payer: PPO

## 2015-07-20 ENCOUNTER — Telehealth: Payer: Self-pay | Admitting: Vascular Surgery

## 2015-07-20 NOTE — Telephone Encounter (Signed)
Ms Catalina left a message on the appointment voicemail stating that Mr Mitrovic passed away on 14-Aug-2015. She wanted to let Dr Bridgett Larsson know he had passed and to thank Dr Bridgett Larsson for his compassion and concern when dealing with Mr Thiesen. She is appreciative of all the care he received from our office.  FYI- route to Dr Bridgett Larsson for review.

## 2015-07-27 ENCOUNTER — Ambulatory Visit: Payer: PPO

## 2015-08-12 NOTE — Discharge Summary (Signed)
Death Summary  Justin Woods BJS:[id number] DOB: July 28, 1931 DOA: 07/12/15  PCP: Wenda Low, MD   Admit date: 2015-07-12 Date of Death: 21-Jul-2015  Final Diagnoses:  Principal Problem:   Hypercalcemia of malignancy Active Problems:   End-stage renal disease on hemodialysis [facility])   Essential hypertension   Advanced Multiple myeloma (HCC)   Leukocytosis   Anemia of chronic disease   Acute encephalopathy   Hypercalcemia    History of present illness:  Justin Woods is a 82 year old with a past medical history of multiple myeloma with light chain nephropathy and renal failure was diagnosed in June 2016,  on hemodialysis scheduled Mondays Wednesdays and Fridays. He was undergoing therapy with Velcade and Decadron. Patient had been receiving his care at the cancer center. He was brought to the emergency department on Jul 12, 2015 after having a fall at home. Family members reporting a progressive functional decline over the past 2 weeks, having spells of increased confusion. Initial lab work revealed severe hypercalcemia having calcium greater than 15  Hospital Course:   1. Failure to thrive/functional decline/acute encephalopathy -Family members reported progressive functional decline over the past 2 weeks characterize by mental status changes, confusion, generalized weakness, having falls at home. -hypercalcemia was a significant contributor, however, likely multifactorial. He has multiple comorbidities including multiple myeloma, end-stage renal disease on hemodialysis, compression fractures. -Progression of multiple myeloma noted on MRI of LS-spine  -Overall poor prognosis , discussed with Dr.Schertz and Julien Nordmann, wife understands poor prognosis, after discussion with Renal, Oncology and family decision made for comfort care only, stopped HD since 12/2 -started on IV morphine and ativan for symptom management -expired 07/21/2023  2. Severe hypercalcemia -calcium greater than 15 on  admission -improved, given IV pamidronate on 11/28 -was on SQ calcitonin, stopped since -see above  3. New onset back pain/lumbar compression fracture/PRogressive Myeloma. -recent history of pathological fracture involving L2 s/p kyphoplasty on 06/21/2015. -He was evaluated by interventional radiology, recommended MRI shows new compression fracture at L2 likely to be pathologic and progression of myeloma. k -once plans made for comfort measures, no intervention was felt to be needed at this time  4. End-stage renal disease. -Followed by Nephrology , Stopped HD 12/2    Time: 6mn  Signed:  Lolitha Tortora  [facility] 07/25/2015, 4:54 PM

## 2015-08-12 DEATH — deceased

## 2016-01-29 ENCOUNTER — Other Ambulatory Visit: Payer: Self-pay | Admitting: Nurse Practitioner

## 2016-03-11 IMAGING — MR MR MRA HEAD W/O CM
2 series · 23 of 48 positions shown · non-contrast
Comparison: 03/05/2015 brain MR.

CLINICAL DATA: 84-year-old hypertensive male with myeloma (ongoing
chemotherapy) presenting with body tingling, dizziness and
paresthesias of the feet. Renal failure. On dialysis. Subsequent
encounter.

EXAM:
MRA HEAD WITHOUT CONTRAST
MRA NECK WITHOUT CONTRAST
TECHNIQUE: Angiographic images of the Circle of Willis were obtained using MRA
technique without intravenous contrast. Angiographic images of the
neck were obtained using MRA technique without intravenous contrast.
Carotid stenosis measurements (when applicable) are obtained
utilizing NASCET criteria, using the distal internal carotid
diameter as the denominator.

[Series 3: tof_3d_multi-slab · axial · 0.7mm · 0.35mm/px · z∈[-79,+30]mm · 14 of 162 slices shown]
[im 1/162]
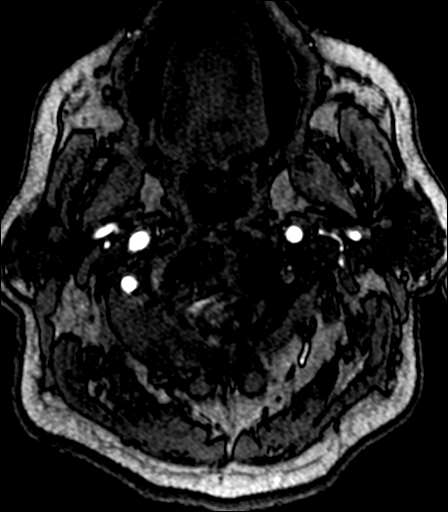
[im 6/162]
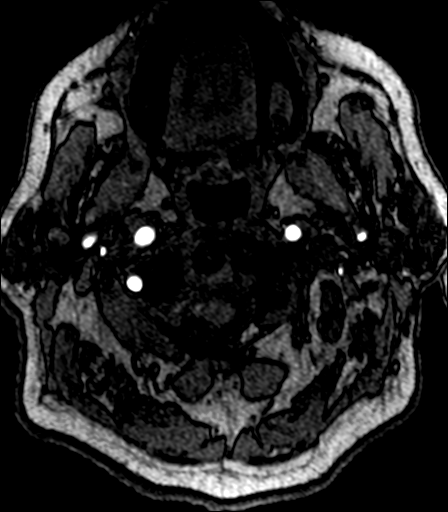
[im 12/162]
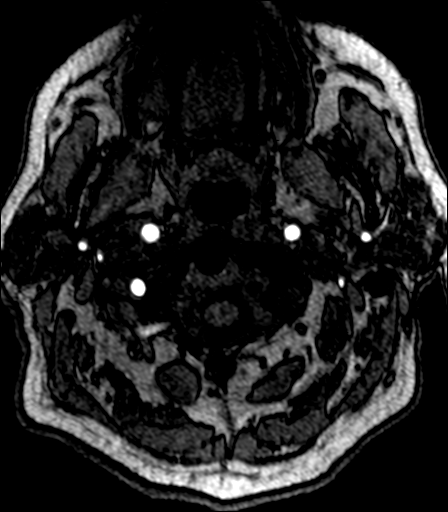
[im 18/162]
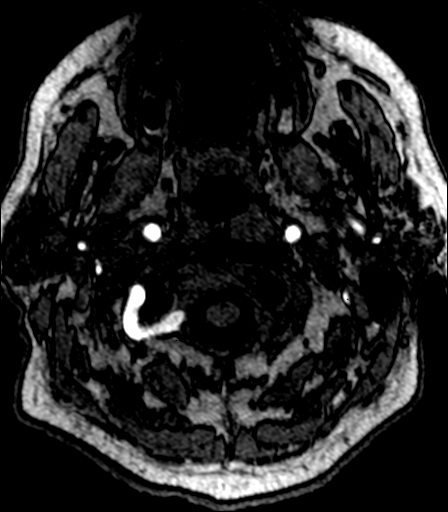
[im 24/162]
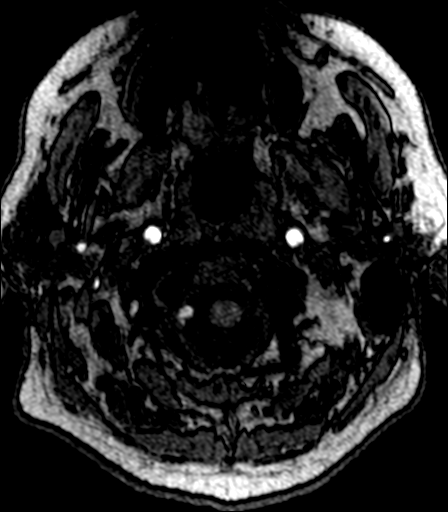
[im 29/162]
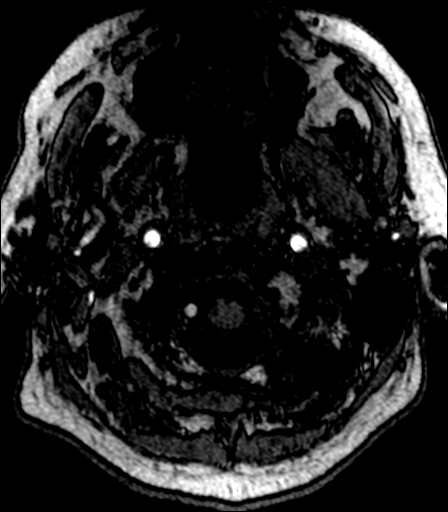
[im 52/162]
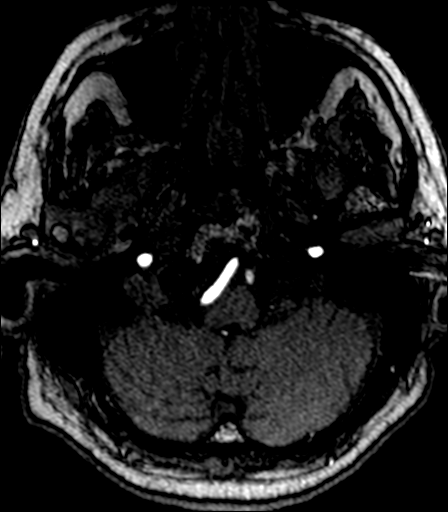
[im 70/162]
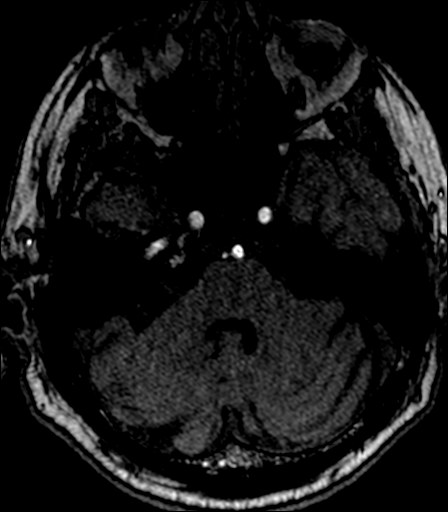
[im 81/162]
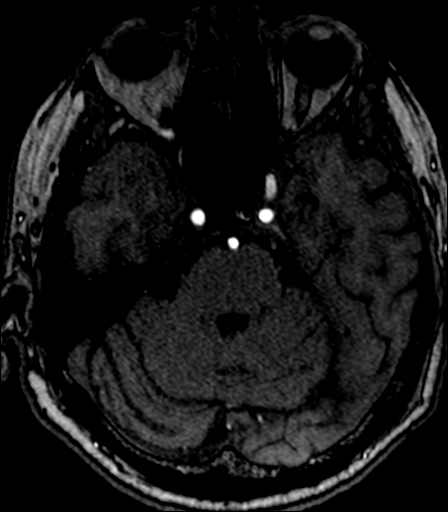
[im 93/162]
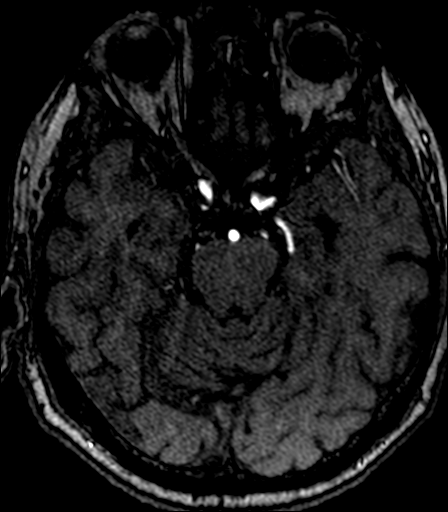
[im 110/162]
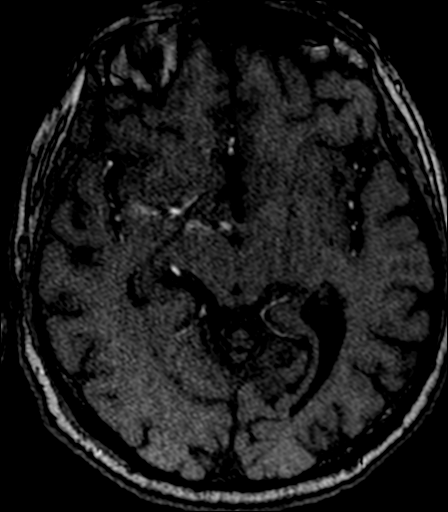
[im 133/162]
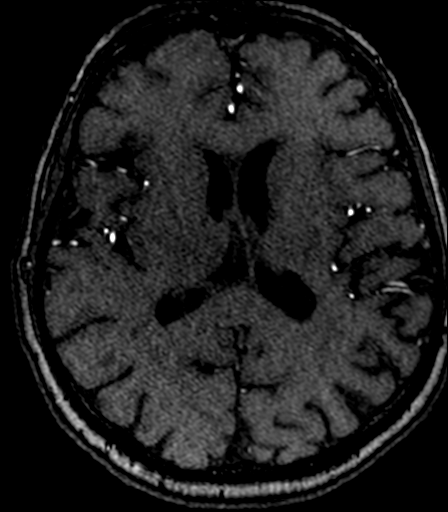
[im 139/162]
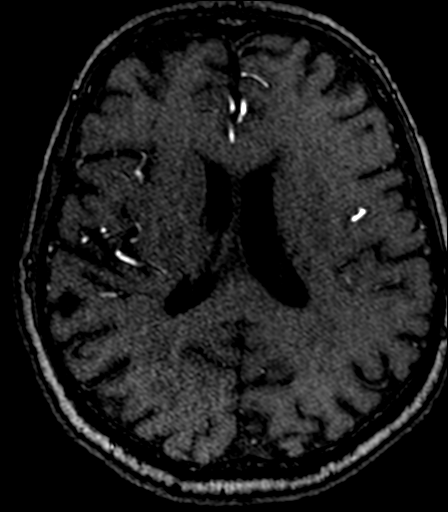
[im 156/162]
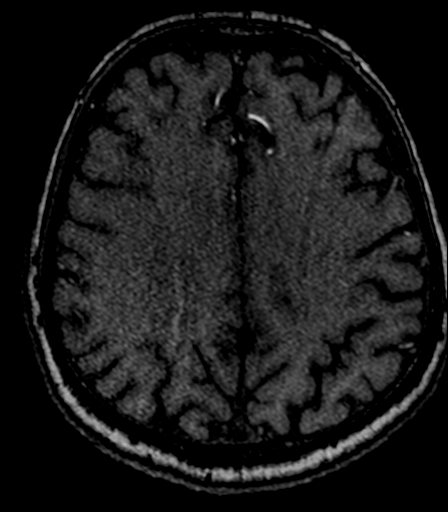

[Series 9: TOF · axial · 3.0mm · 0.49mm/px · z∈[-226,-37]mm · 9 of 106 slices shown]
[im 6/106]
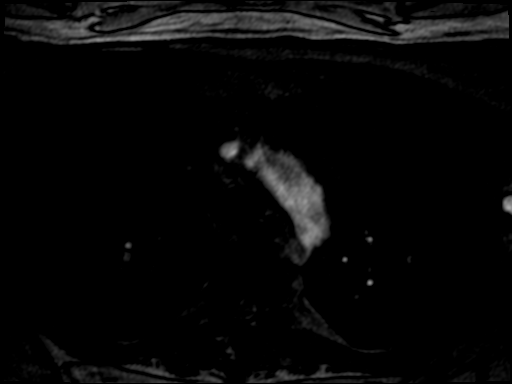
[im 18/106]
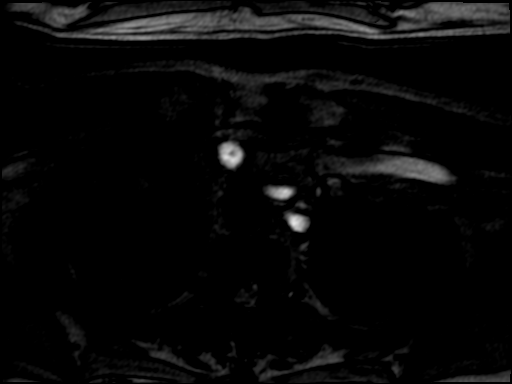
[im 30/106]
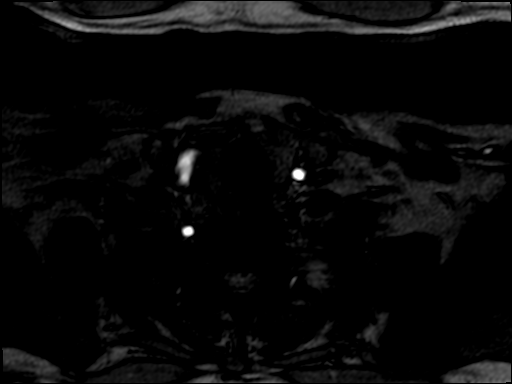
[im 47/106]
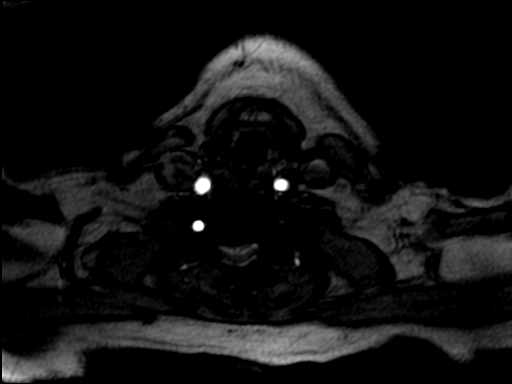
[im 53/106]
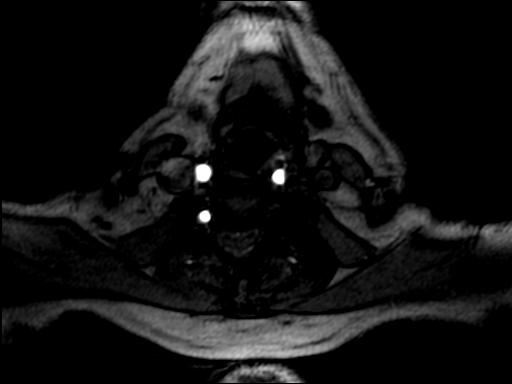
[im 59/106]
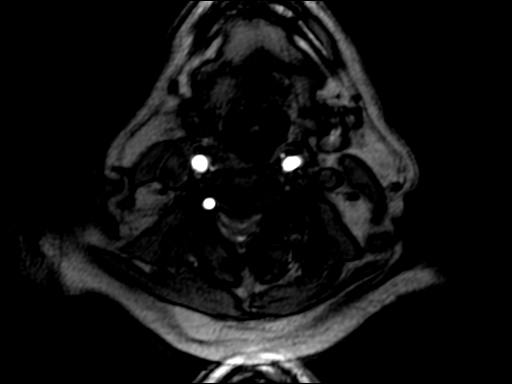
[im 76/106]
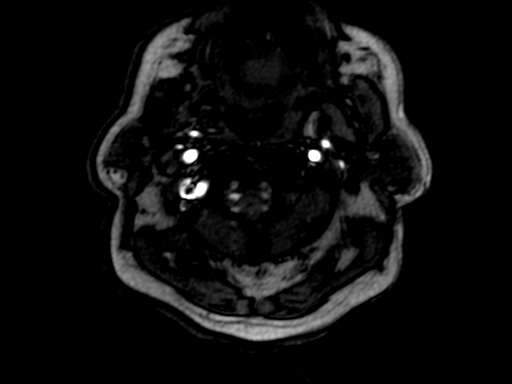
[im 88/106]
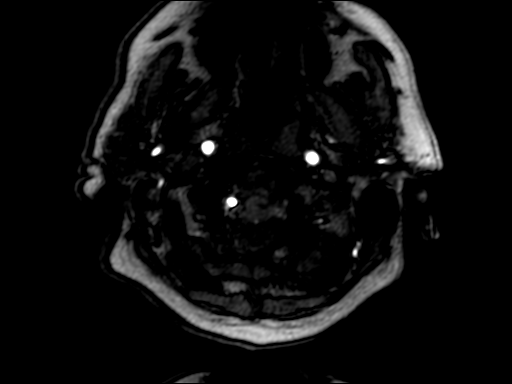
[im 100/106]
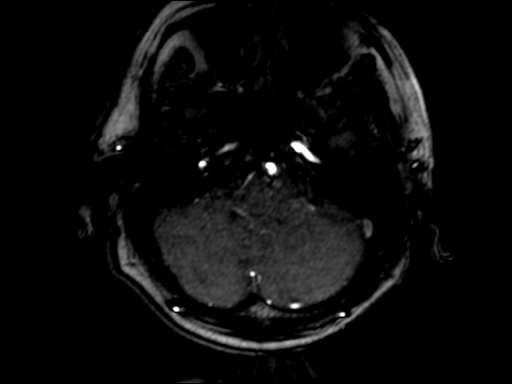

[23 of 48 positions shown; findings below may reference images not displayed]

FINDINGS: MRA HEAD FINDINGS

Occluded left vertebral artery. Minimal amount of flow within the
distal left vertebral artery felt to be related to retrograde flow.

No significant stenosis of the right vertebral artery.

Neither posterior inferior cerebellar artery is visualized.

Nonvisualized left anterior inferior cerebellar artery.

Mild narrowing distal basilar artery.

Narrowing proximal left superior cerebellar artery.

Narrowing posterior cerebral artery distal branches more notable on
the left.

Mild narrowing cavernous segment left internal carotid artery.

Anterior circulation without medium or large size vessel significant
stenosis or occlusion.

Middle cerebral artery branch vessel irregularity with mild
narrowing bilaterally.

No aneurysm noted.

MRA NECK FINDINGS

Left vertebral artery is occluded. Prominent size right vertebral
artery without significant stenosis. Artifact extends through the
proximal aspect.

No significant carotid bifurcation narrowing.

Ectatic common carotid arteries.

Three vessel aortic arch.
IMPRESSION: MRA HEAD

Occluded left vertebral artery. Minimal amount of flow within the
distal left vertebral artery felt to be related to retrograde flow.

Neither posterior inferior cerebellar artery is visualized.

Nonvisualized left anterior inferior cerebellar artery.

Mild narrowing distal basilar artery.

Narrowing proximal left superior cerebellar artery.

Narrowing posterior cerebral artery distal branches more notable on
the left.

Anterior circulation without medium or large size vessel significant
stenosis or occlusion.

Middle cerebral artery branch vessel irregularity with mild
narrowing bilaterally.

MRA NECK

Left vertebral artery is occluded.

Prominent size right vertebral artery without significant stenosis.
Artifact extends through the proximal aspect.

No significant carotid bifurcation narrowing on either side.

## 2016-05-04 IMAGING — CR DG CHEST 2V
2 series · 2 of 2 positions shown · non-contrast
Comparison: 02/07/2015

CLINICAL DATA: Altered mental status. History of tumor to the
vertebral spine post vertebroplasty and radiofrequency ablation on
06/21/2015. Since surgery patient is at increased confusion and
weakness. Witnessed fall today.

EXAM:
CHEST  2 VIEW

[w chest lat]
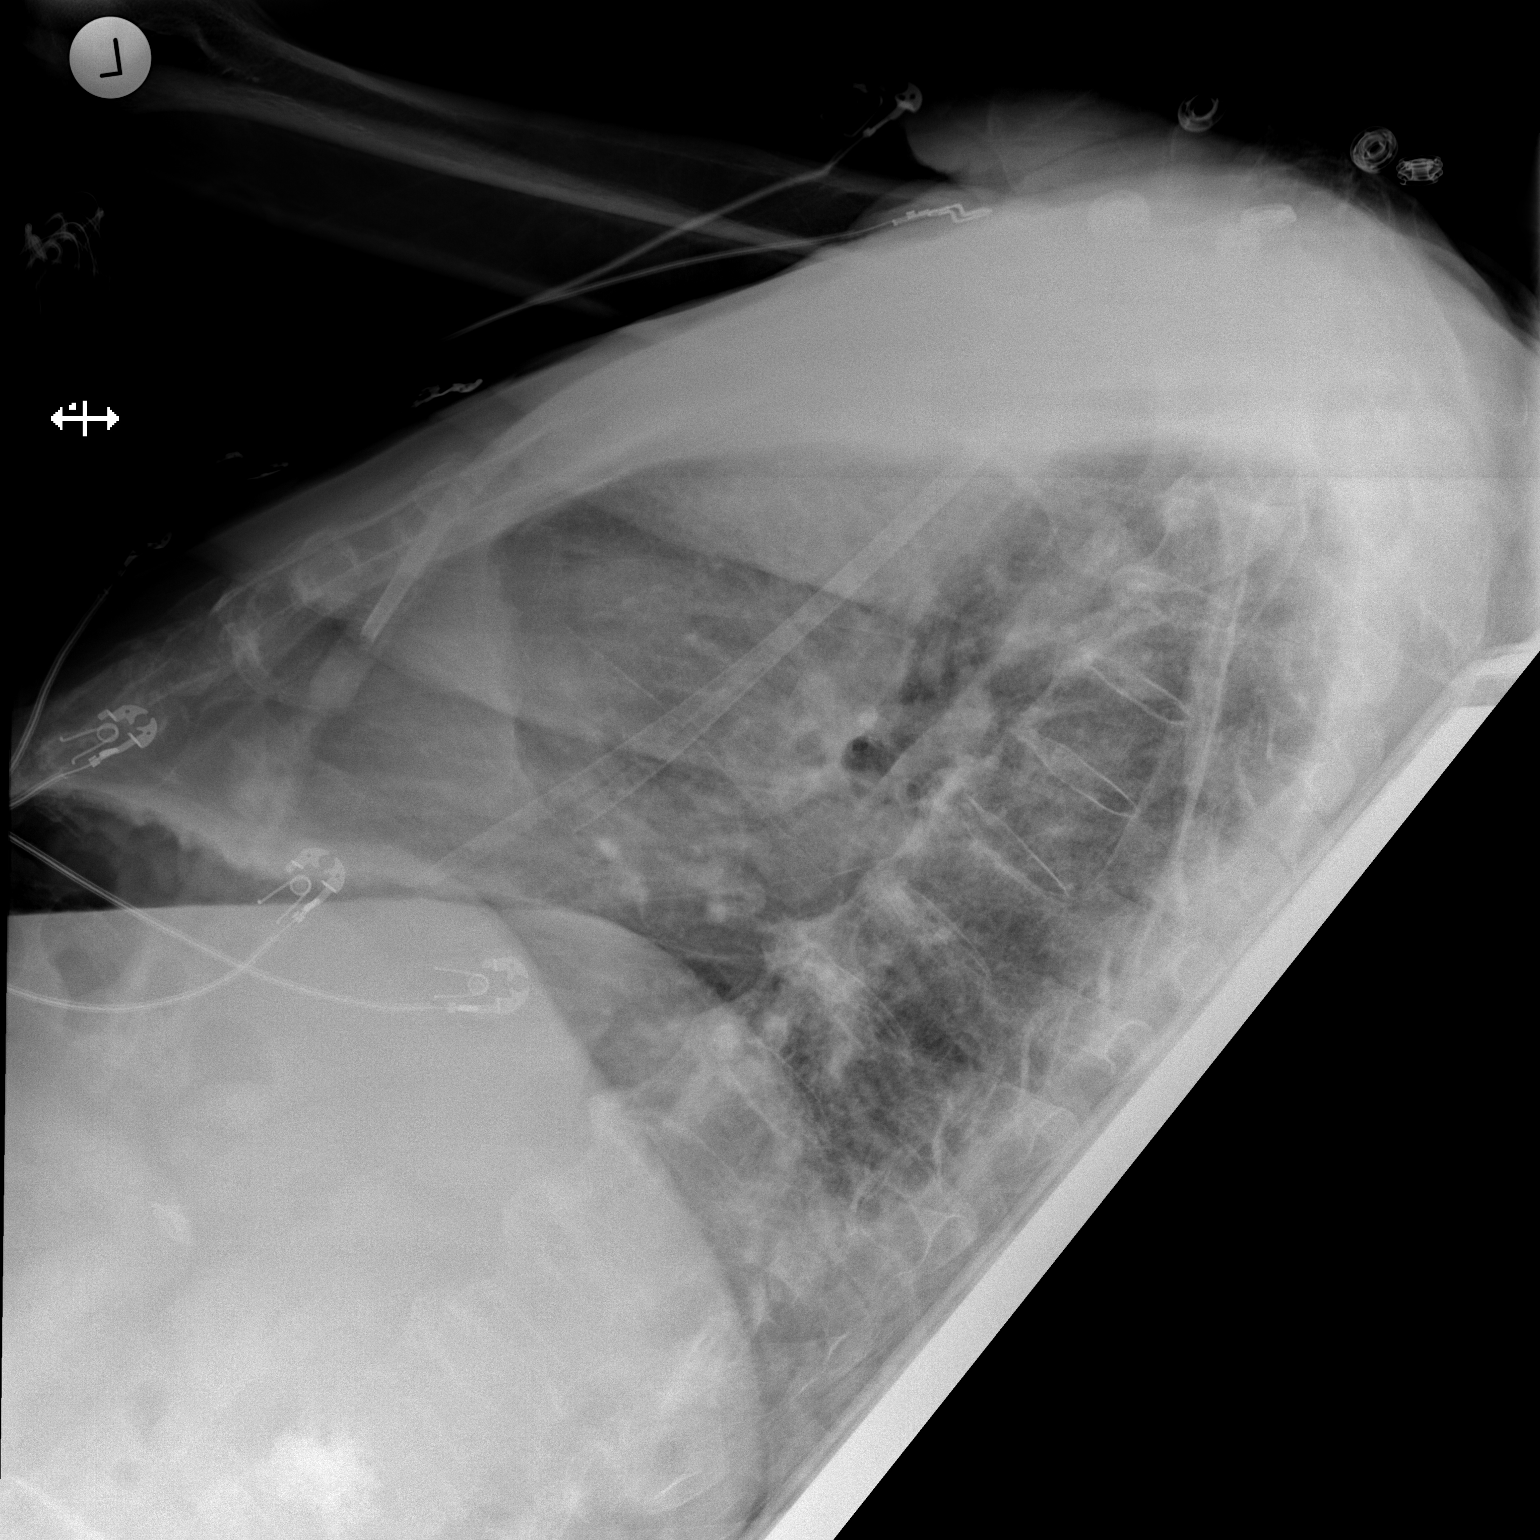

[x chest ap]
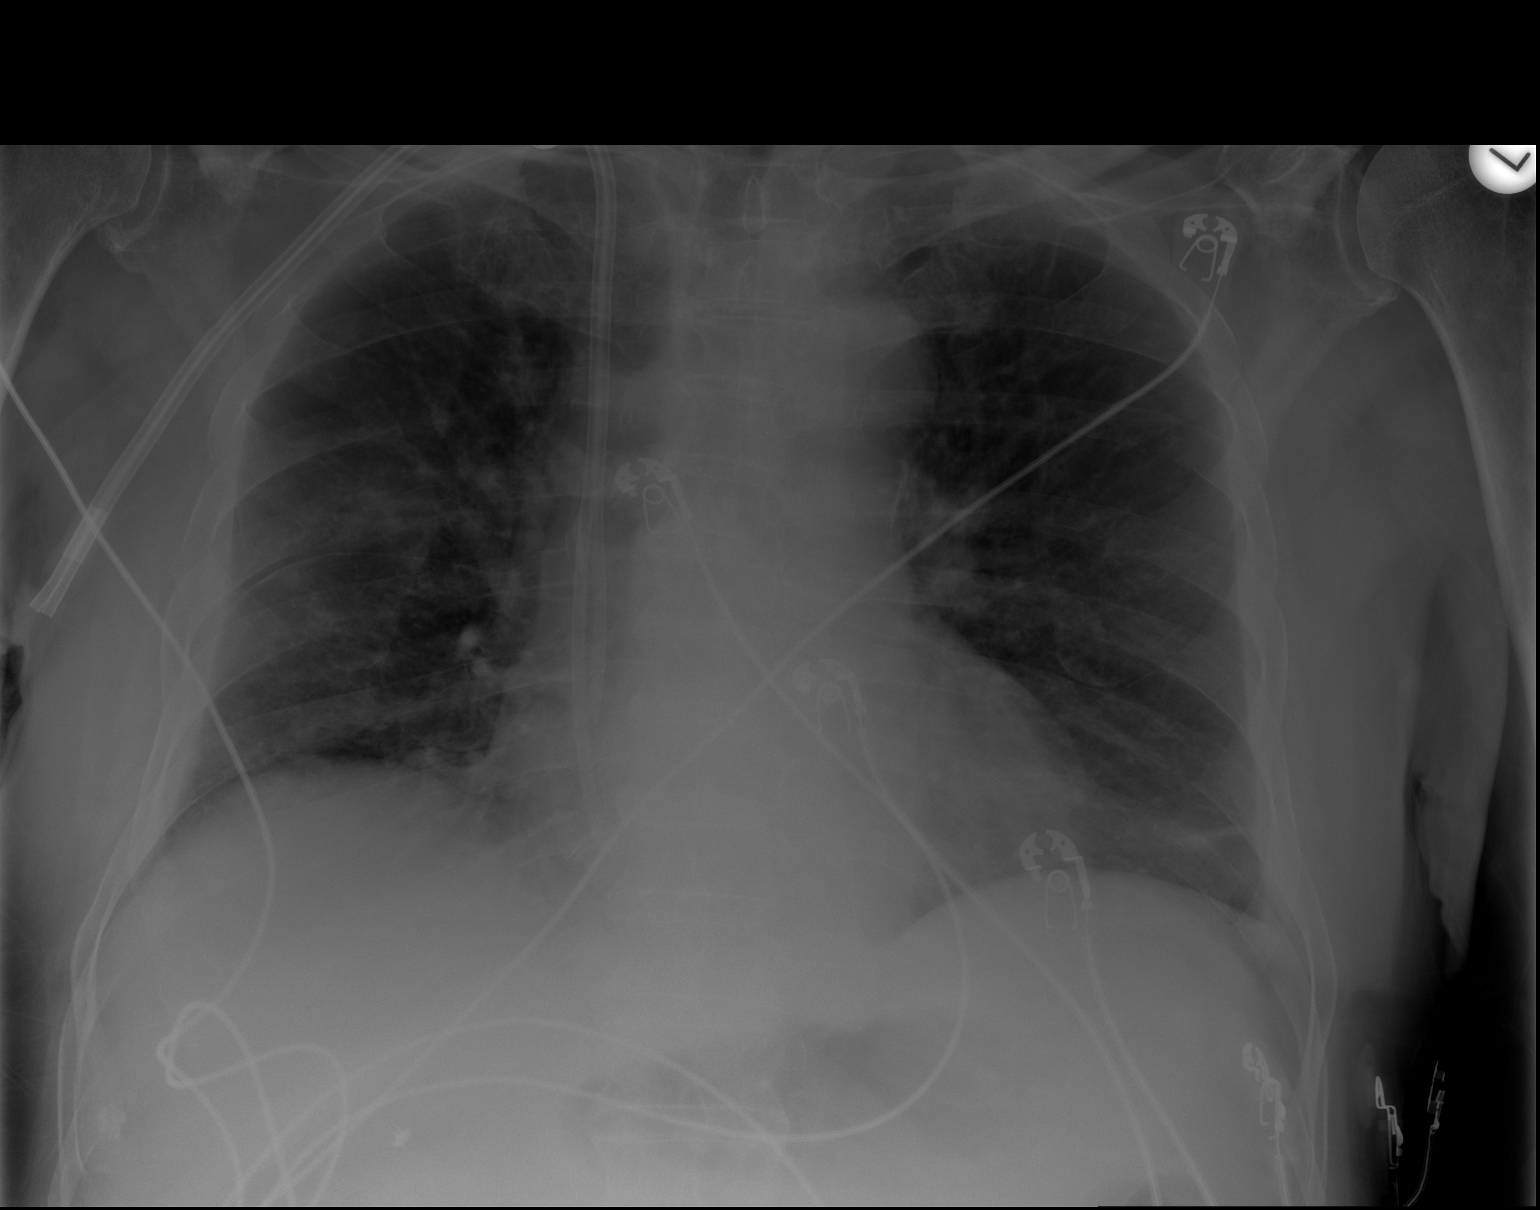

[2 of 2 positions shown; findings below may reference images not displayed]

FINDINGS: Right central venous dialysis catheter with lead tips over the
cavoatrial junction and RA region. Shallow inspiration with
atelectasis in the lung bases. Normal heart size and pulmonary
vascularity. No focal airspace disease or consolidation. No
pneumothorax. No blunting of costophrenic angles. Degenerative
changes in the spine.
IMPRESSION: Shallow inspiration with atelectasis in the lung bases. No focal
consolidation.
